# Patient Record
Sex: Male | Born: 1955 | Race: White | Hispanic: No | Marital: Single | Smoking: Current some day smoker
Health system: Southern US, Community
[De-identification: ages and names within clinical notes are randomized; demographics above are authoritative.]

## PROBLEM LIST (undated history)

## (undated) DIAGNOSIS — F319 Bipolar disorder, unspecified: Secondary | ICD-10-CM

## (undated) DIAGNOSIS — J189 Pneumonia, unspecified organism: Secondary | ICD-10-CM

## (undated) DIAGNOSIS — F329 Major depressive disorder, single episode, unspecified: Secondary | ICD-10-CM

## (undated) DIAGNOSIS — F1020 Alcohol dependence, uncomplicated: Secondary | ICD-10-CM

## (undated) DIAGNOSIS — F32A Depression, unspecified: Secondary | ICD-10-CM

## (undated) DIAGNOSIS — E7800 Pure hypercholesterolemia, unspecified: Secondary | ICD-10-CM

## (undated) DIAGNOSIS — F410 Panic disorder [episodic paroxysmal anxiety] without agoraphobia: Secondary | ICD-10-CM

## (undated) DIAGNOSIS — C3490 Malignant neoplasm of unspecified part of unspecified bronchus or lung: Secondary | ICD-10-CM

## (undated) DIAGNOSIS — J449 Chronic obstructive pulmonary disease, unspecified: Secondary | ICD-10-CM

## (undated) DIAGNOSIS — F419 Anxiety disorder, unspecified: Secondary | ICD-10-CM

## (undated) DIAGNOSIS — F4310 Post-traumatic stress disorder, unspecified: Secondary | ICD-10-CM

## (undated) HISTORY — PX: TONSILLECTOMY: SUR1361

---

## 2007-04-17 ENCOUNTER — Emergency Department (HOSPITAL_COMMUNITY): Admission: EM | Admit: 2007-04-17 | Discharge: 2007-04-17 | Payer: Self-pay | Admitting: Emergency Medicine

## 2010-09-19 ENCOUNTER — Encounter: Payer: Self-pay | Admitting: Emergency Medicine

## 2010-09-19 ENCOUNTER — Emergency Department (HOSPITAL_COMMUNITY)
Admission: EM | Admit: 2010-09-19 | Discharge: 2010-09-19 | Disposition: A | Discharge status: Home / Self Care | Payer: Medicare Other | Attending: Emergency Medicine | Admitting: Emergency Medicine

## 2010-09-19 ENCOUNTER — Emergency Department (HOSPITAL_COMMUNITY): Payer: Medicare Other

## 2010-09-19 DIAGNOSIS — J4489 Other specified chronic obstructive pulmonary disease: Secondary | ICD-10-CM | POA: Insufficient documentation

## 2010-09-19 DIAGNOSIS — J449 Chronic obstructive pulmonary disease, unspecified: Secondary | ICD-10-CM

## 2010-09-19 DIAGNOSIS — F17200 Nicotine dependence, unspecified, uncomplicated: Secondary | ICD-10-CM | POA: Insufficient documentation

## 2010-09-19 HISTORY — DX: Chronic obstructive pulmonary disease, unspecified: J44.9

## 2010-09-19 HISTORY — DX: Pure hypercholesterolemia, unspecified: E78.00

## 2010-09-19 MED ORDER — PREDNISONE 50 MG PO TABS: 50.0000 mg | ORAL_TABLET | Freq: Every day | ORAL | Status: AC

## 2010-09-19 MED ORDER — IPRATROPIUM BROMIDE 0.02 % IN SOLN
0.5000 mg | Freq: Once | RESPIRATORY_TRACT | Status: AC
  Administered 2010-09-19: 0.5 mg via RESPIRATORY_TRACT
  Filled 2010-09-19: qty 2.5

## 2010-09-19 MED ORDER — ALBUTEROL SULFATE (5 MG/ML) 0.5% IN NEBU
5.0000 mg | INHALATION_SOLUTION | Freq: Once | RESPIRATORY_TRACT | Status: AC
  Administered 2010-09-19: 5 mg via RESPIRATORY_TRACT
  Filled 2010-09-19: qty 1

## 2010-09-19 MED ORDER — PREDNISONE 20 MG PO TABS
60.0000 mg | ORAL_TABLET | Freq: Once | ORAL | Status: AC
  Administered 2010-09-19: 60 mg via ORAL
  Filled 2010-09-19: qty 3

## 2010-09-19 NOTE — ED Notes (Signed)
Sob x 1 week-worse with exertion.

## 2010-09-19 NOTE — ED Provider Notes (Addendum)
History     CSN: [id number] Arrival date & time: 09/19/2010  8:01 PM  Chief Complaint  Patient presents with  . Shortness of Breath   HPI patient has had a diagnosis of COPD for several years. He continues to smoke. Increasing shortness of breath over the past few months, getting worse. No chest pain, fever, chills, weight loss, wheezing. breathing worsened today trying to lift heavy object.  Past Medical History  Diagnosis Date  . COPD (chronic obstructive pulmonary disease)   . High cholesterol     Past Surgical History  Procedure Date  . Tonsillectomy     Family History  Problem Relation Age of Onset  . Coronary artery disease Mother   . Coronary artery disease Father     History  Substance Use Topics  . Smoking status: Current Everyday Smoker -- 1.0 packs/day  . Smokeless tobacco: Not on file  . Alcohol Use: No      Review of Systems  All other systems reviewed and are negative.    Physical Exam  BP 153/95  Pulse 82  Temp 97.9 F (36.6 C)  Resp 20  Ht 5\' 8"  (1.727 m)  Wt 175 lb (79.379 kg)  BMI 26.61 kg/m2  SpO2 95%  Physical Exam  Nursing note and vitals reviewed. Constitutional: He is oriented to person, place, and time. He appears well-developed and well-nourished.  HENT:  Head: Normocephalic and atraumatic.  Eyes: Conjunctivae and EOM are normal. Pupils are equal, round, and reactive to light.  Neck: Normal range of motion. Neck supple.  Cardiovascular: Normal rate and regular rhythm.   Pulmonary/Chest: Effort normal and breath sounds normal.  Abdominal: Soft. Bowel sounds are normal.  Musculoskeletal: Normal range of motion.  Neurological: He is alert and oriented to person, place, and time.  Skin: Skin is warm and dry.  Psychiatric: He has a normal mood and affect.    ED Course  Procedures  MDM patient has chronic COPD. He is a smoker. Chest x-ray was normal. Will give albuterol Atrovent breathing treatment and start prednisone.  Referral to the [facility] pulmonology   Recheck at 2120 good color. No respiratory distress. Patient understands need for pulmonary followup   Donnetta Hutching, MD 09/19/10 2115  Donnetta Hutching, MD 09/19/10 2122

## 2011-02-17 ENCOUNTER — Emergency Department (HOSPITAL_COMMUNITY)
Admission: EM | Admit: 2011-02-17 | Discharge: 2011-02-17 | Disposition: A | Discharge status: Home / Self Care | Payer: Medicare Other | Attending: Emergency Medicine | Admitting: Emergency Medicine

## 2011-02-17 ENCOUNTER — Encounter (HOSPITAL_COMMUNITY): Payer: Self-pay

## 2011-02-17 DIAGNOSIS — F411 Generalized anxiety disorder: Secondary | ICD-10-CM | POA: Insufficient documentation

## 2011-02-17 DIAGNOSIS — F319 Bipolar disorder, unspecified: Secondary | ICD-10-CM | POA: Insufficient documentation

## 2011-02-17 DIAGNOSIS — F329 Major depressive disorder, single episode, unspecified: Secondary | ICD-10-CM

## 2011-02-17 DIAGNOSIS — J449 Chronic obstructive pulmonary disease, unspecified: Secondary | ICD-10-CM | POA: Insufficient documentation

## 2011-02-17 DIAGNOSIS — F17200 Nicotine dependence, unspecified, uncomplicated: Secondary | ICD-10-CM | POA: Insufficient documentation

## 2011-02-17 DIAGNOSIS — F3130 Bipolar disorder, current episode depressed, mild or moderate severity, unspecified: Secondary | ICD-10-CM | POA: Insufficient documentation

## 2011-02-17 DIAGNOSIS — J4489 Other specified chronic obstructive pulmonary disease: Secondary | ICD-10-CM | POA: Insufficient documentation

## 2011-02-17 DIAGNOSIS — E789 Disorder of lipoprotein metabolism, unspecified: Secondary | ICD-10-CM | POA: Insufficient documentation

## 2011-02-17 HISTORY — DX: Depression, unspecified: F32.A

## 2011-02-17 HISTORY — DX: Anxiety disorder, unspecified: F41.9

## 2011-02-17 HISTORY — DX: Bipolar disorder, unspecified: F31.9

## 2011-02-17 HISTORY — DX: Post-traumatic stress disorder, unspecified: F43.10

## 2011-02-17 HISTORY — DX: Major depressive disorder, single episode, unspecified: F32.9

## 2011-02-17 HISTORY — DX: Panic disorder (episodic paroxysmal anxiety): F41.0

## 2011-02-17 HISTORY — DX: Alcohol dependence, uncomplicated: F10.20

## 2011-02-17 LAB — COMPREHENSIVE METABOLIC PANEL
ALT: 13 U/L (ref 0–53)
Alkaline Phosphatase: 64 U/L (ref 39–117)
BUN: 11 mg/dL (ref 6–23)
CO2: 29 mEq/L (ref 19–32)
Calcium: 10.1 mg/dL (ref 8.4–10.5)
GFR calc Af Amer: >90 mL/min (ref >90)
GFR calc non Af Amer: >90 mL/min (ref >90)
Glucose, Bld: 100 mg/dL — ABNORMAL HIGH (ref 70–99)
Sodium: 138 mEq/L (ref 135–145)
Total Protein: 7.5 g/dL (ref 6.0–8.3)

## 2011-02-17 LAB — CBC
Hemoglobin: 14.8 g/dL (ref 13.0–17.0)
MCHC: 33.6 g/dL (ref 30.0–36.0)
RDW: 12.7 % (ref 11.5–15.5)
WBC: 8.8 10*3/uL (ref 4.0–10.5)

## 2011-02-17 LAB — RAPID URINE DRUG SCREEN, HOSP PERFORMED
Benzodiazepines: NOT DETECTED
Cocaine: NOT DETECTED
Opiates: NOT DETECTED

## 2011-02-17 MED ORDER — PAROXETINE HCL 20 MG PO TABS: 40.0000 mg | ORAL_TABLET | Freq: Every day | ORAL | Status: DC

## 2011-02-17 MED ORDER — LORAZEPAM 1 MG PO TABS
1.0000 mg | ORAL_TABLET | Freq: Once | ORAL | Status: AC
  Administered 2011-02-17: 1 mg via ORAL
  Filled 2011-02-17: qty 1

## 2011-02-17 MED ORDER — LORAZEPAM 1 MG PO TABS: 0.5000 mg | ORAL_TABLET | Freq: Three times a day (TID) | ORAL | Status: AC | PRN

## 2011-02-17 MED ORDER — HALOPERIDOL 2 MG PO TABS: 2.0000 mg | ORAL_TABLET | Freq: Every evening | ORAL | Status: DC | PRN

## 2011-02-17 NOTE — ED Notes (Signed)
Rx given x3 D/c instructions reviewed w/ pt and family - pt and family deny any further questions or concerns at present.  

## 2011-02-17 NOTE — ED Notes (Signed)
Out of my mental health meds. For 3 days.  Does not have a doctor Out of ativan for 6 days, all over meds for 3 days.   Wife concerned that he may get sicker, pt denies any si/hi ideations, "im just sick of being sick and tired". Pt stated he is alcoholic and started ativan to keep him from drinking., denies any street drug use.

## 2011-02-17 NOTE — ED Provider Notes (Signed)
History     CSN: [id number]  Arrival date & time 02/17/11  1704   First MD Initiated Contact with Patient 02/17/11 1721      Chief Complaint  Patient presents with  . Medical Clearance    (Consider location/radiation/quality/duration/timing/severity/associated sxs/prior treatment) The history is provided by the patient.  pt states hx depression, hx etoh abuse, states for past several days out of ativan. States has taken on prn basis for past year. Prior that that states problems w etoh abuse, no etoh x 1 yr. States is on celexa but feels no benefit from taking.  States he feels the ativan does a better job at controlling his symptoms.  States still has on and off feelings of depression which he has had for past several years.  Denies having any suicidal thoughts or plan.  Indicates in past few weeks spending more time in bed, sleeping/resting, but that at night not getting consistent/good sleep.  Normal appetite. No wt change.  Denies tremors or hx seizures. No nvd. States otherwise health at baseline.     Past Medical History  Diagnosis Date  . COPD (chronic obstructive pulmonary disease)   . High cholesterol   . Alcohol addiction   . Depression   . Bipolar 1 disorder   . PTSD (post-traumatic stress disorder)   . Anxiety   . Panic     Past Surgical History  Procedure Date  . Tonsillectomy     Family History  Problem Relation Age of Onset  . Coronary artery disease Mother   . Coronary artery disease Father     History  Substance Use Topics  . Smoking status: Current Everyday Smoker -- 1.0 packs/day    Types: Cigarettes  . Smokeless tobacco: Not on file  . Alcohol Use: No     stopped 1 year ago      Review of Systems  Constitutional: Negative for fever.  HENT: Negative for neck pain.   Eyes: Negative for redness.  Respiratory: Negative for shortness of breath.   Cardiovascular: Negative for chest pain.  Gastrointestinal: Negative for vomiting, abdominal pain  and diarrhea.  Genitourinary: Negative for flank pain.  Musculoskeletal: Negative for back pain.  Skin: Negative for rash.  Neurological: Negative for headaches.  Hematological: Does not bruise/bleed easily.  Psychiatric/Behavioral: Negative for agitation.    Allergies  Review of patient's allergies indicates no known allergies.  Home Medications  No current outpatient prescriptions on file.  BP 154/92  Pulse 93  Temp(Src) 97.8 F (36.6 C) (Oral)  Resp 20  Ht 5\' 8"  (1.727 m)  Wt 185 lb (83.915 kg)  BMI 28.13 kg/m2  SpO2 97%  Physical Exam  Nursing note and vitals reviewed. Constitutional: He is oriented to person, place, and time. He appears well-developed and well-nourished. No distress.  HENT:  Head: Atraumatic.  Eyes: Pupils are equal, round, and reactive to light.  Neck: Neck supple. No tracheal deviation present.  Cardiovascular: Normal rate, regular rhythm, normal heart sounds and intact distal pulses.   Pulmonary/Chest: Effort normal and breath sounds normal. No accessory muscle usage. No respiratory distress.  Abdominal: Soft. He exhibits no distension. There is no tenderness.  Musculoskeletal: Normal range of motion. He exhibits no edema.  Neurological: He is alert and oriented to person, place, and time.       Motor intact bil. Steady gait.   Skin: Skin is warm and dry.  Psychiatric: He has a normal mood and affect.       Denies  SI.     ED Course  Procedures (including critical care time)  Labs Reviewed  COMPREHENSIVE METABOLIC PANEL - Abnormal; Notable for the following:    Glucose, Bld 100 (*)    All other components within normal limits  CBC  URINE RAPID DRUG SCREEN (HOSP PERFORMED)  ETHANOL   Results for orders placed during the hospital encounter of 02/17/11  COMPREHENSIVE METABOLIC PANEL      Component Value Range   Sodium 138  135 - 145 (mEq/L)   Potassium 3.7  3.5 - 5.1 (mEq/L)   Chloride 100  96 - 112 (mEq/L)   CO2 29  19 - 32 (mEq/L)    Glucose, Bld 100 (*) 70 - 99 (mg/dL)   BUN 11  6 - 23 (mg/dL)   Creatinine, Ser 2.95  0.50 - 1.35 (mg/dL)   Calcium 28.4  8.4 - 10.5 (mg/dL)   Total Protein 7.5  6.0 - 8.3 (g/dL)   Albumin 4.0  3.5 - 5.2 (g/dL)   AST 15  0 - 37 (U/L)   ALT 13  0 - 53 (U/L)   Alkaline Phosphatase 64  39 - 117 (U/L)   Total Bilirubin 0.5  0.3 - 1.2 (mg/dL)   GFR calc non Af Amer >90  >90 (mL/min)   GFR calc Af Amer >90  >90 (mL/min)  CBC      Component Value Range   WBC 8.8  4.0 - 10.5 (K/uL)   RBC 4.81  4.22 - 5.81 (MIL/uL)   Hemoglobin 14.8  13.0 - 17.0 (g/dL)   HCT 13.2  44.0 - 10.2 (%)   MCV 91.5  78.0 - 100.0 (fL)   MCH 30.8  26.0 - 34.0 (pg)   MCHC 33.6  30.0 - 36.0 (g/dL)   RDW 72.5  36.6 - 44.0 (%)   Platelets 307  150 - 400 (K/uL)  URINE RAPID DRUG SCREEN (HOSP PERFORMED)      Component Value Range   Opiates NONE DETECTED  NONE DETECTED    Cocaine NONE DETECTED  NONE DETECTED    Benzodiazepines NONE DETECTED  NONE DETECTED    Amphetamines NONE DETECTED  NONE DETECTED    Tetrahydrocannabinol NONE DETECTED  NONE DETECTED    Barbiturates NONE DETECTED  NONE DETECTED   ETHANOL      Component Value Range   Alcohol, Ethyl (B) <11  0 - 11 (mg/dL)        MDM  Labs sent. Ativan 1 mg po. Telepsych consult.   Recheck no tremor or shakes, watching tv intently. Have spoken to telepsych md who is about to evaluate patient, updated pt on labs, and that telepsych md should be with him shortly.    telepsych consult/recommendations pending. Signed out to DR DELO to f/u with telepsych recommendations and dispo patient appropriately (if telepsych rec inpt then will need act consult for placement, if rec outpt follow up then will also need resource guide and pcp follow up/referral).      Suzi Roots, MD 02/17/11 2141

## 2011-02-17 NOTE — ED Notes (Signed)
Pt completed telepsych assessment.

## 2011-04-06 ENCOUNTER — Encounter (HOSPITAL_COMMUNITY): Payer: Self-pay | Admitting: *Deleted

## 2011-04-06 ENCOUNTER — Emergency Department (HOSPITAL_COMMUNITY)
Admission: EM | Admit: 2011-04-06 | Discharge: 2011-04-06 | Disposition: A | Discharge status: Home / Self Care | Payer: Medicare Other | Attending: Emergency Medicine | Admitting: Emergency Medicine

## 2011-04-06 DIAGNOSIS — F17200 Nicotine dependence, unspecified, uncomplicated: Secondary | ICD-10-CM | POA: Insufficient documentation

## 2011-04-06 DIAGNOSIS — Z760 Encounter for issue of repeat prescription: Secondary | ICD-10-CM | POA: Insufficient documentation

## 2011-04-06 DIAGNOSIS — J449 Chronic obstructive pulmonary disease, unspecified: Secondary | ICD-10-CM | POA: Insufficient documentation

## 2011-04-06 DIAGNOSIS — F4310 Post-traumatic stress disorder, unspecified: Secondary | ICD-10-CM | POA: Insufficient documentation

## 2011-04-06 DIAGNOSIS — E7800 Pure hypercholesterolemia, unspecified: Secondary | ICD-10-CM | POA: Insufficient documentation

## 2011-04-06 DIAGNOSIS — J4489 Other specified chronic obstructive pulmonary disease: Secondary | ICD-10-CM | POA: Insufficient documentation

## 2011-04-06 DIAGNOSIS — F319 Bipolar disorder, unspecified: Secondary | ICD-10-CM | POA: Insufficient documentation

## 2011-04-06 MED ORDER — HALOPERIDOL 2 MG PO TABS: 2.0000 mg | ORAL_TABLET | Freq: Every day | ORAL | Status: DC

## 2011-04-06 MED ORDER — FLUTICASONE-SALMETEROL 250-50 MCG/DOSE IN AEPB: 1.0000 | INHALATION_SPRAY | Freq: Two times a day (BID) | RESPIRATORY_TRACT | Status: DC

## 2011-04-06 MED ORDER — PAROXETINE HCL 20 MG PO TABS: 40.0000 mg | ORAL_TABLET | Freq: Every day | ORAL | Status: DC

## 2011-04-06 MED ORDER — TIOTROPIUM BROMIDE MONOHYDRATE 18 MCG IN CAPS: 18.0000 ug | ORAL_CAPSULE | Freq: Every day | RESPIRATORY_TRACT | Status: DC

## 2011-04-06 MED ORDER — LORAZEPAM 0.5 MG PO TABS: 1.0000 mg | ORAL_TABLET | Freq: Three times a day (TID) | ORAL | Status: AC

## 2011-04-06 NOTE — ED Notes (Signed)
Alert, NAD, pt says he will run out of his meds over holiday weekend.  MD office closed today.  Wants refills

## 2011-04-06 NOTE — Discharge Instructions (Signed)
Medication Refill, Emergency Department  We have refilled your medication today as a courtesy to you. It is best for your medical care, however, to take care of getting refills done through your primary caregiver's office. They have your records and can do a better job of follow-up than we can in the emergency department.  On maintenance medications, we often only prescribe enough medications to get you by until you are able to see your regular caregiver. This is a more expensive way to refill medications.  In the future, please plan for refills so that you will not have to use the emergency department for this.  Thank you for your help. Your help allows us to better take care of the daily emergencies that enter our department.  Document Released: 04/13/2003 Document Revised: 12/14/2010 Document Reviewed: 12/25/2004  [employer] Patient Information 2012 [employer].

## 2011-04-06 NOTE — ED Notes (Signed)
Pt unable to see PMD today due to holiday. States he needs refill for albuterol inhaler, advair, spiriva. NAD. Also out of Haloperidol, lorazepam, and Paroxetine since March 15th. States he can't get in with this doctor until M

## 2011-04-08 NOTE — ED Provider Notes (Signed)
History     CSN: [id number]  Arrival date & time 04/06/11  1033   First MD Initiated Contact with Patient 04/06/11 1109      Chief Complaint  Patient presents with  . Medication Refill    (Consider location/radiation/quality/duration/timing/severity/associated sxs/prior treatment) HPI Comments: Patient presents for assistance with medication refill.  He is on multiple medications for history of COPD and posttraumatic stress disorder with anxiety and panic.  He has recently moved back to this area from [place] and [facility], unfortunately his first appointment with a psychiatrist is not until the first week of May.  He has run out of a number of his medications, specifically his Advair his Haldol, Ativan, Paxil and Spiriva.  He has no respiratory complaints today,  but is having increased anxiety and difficulty sleeping.  He tried to be a walk-in patient this morning at [facility] where he plans to resume his primary medical care, but was surprised to find that they were closed today due to the holiday weekend.  He denies any homicidal or suicidal ideation.  The history is provided by the patient.    Past Medical History  Diagnosis Date  . COPD (chronic obstructive pulmonary disease)   . High cholesterol   . Alcohol addiction   . Depression   . Bipolar 1 disorder   . PTSD (post-traumatic stress disorder)   . Anxiety   . Panic     Past Surgical History  Procedure Date  . Tonsillectomy     Family History  Problem Relation Age of Onset  . Coronary artery disease Mother   . Coronary artery disease Father     History  Substance Use Topics  . Smoking status: Current Everyday Smoker -- 1.0 packs/day    Types: Cigarettes  . Smokeless tobacco: Not on file  . Alcohol Use: No     stopped 1 year ago      Review of Systems  Constitutional: Negative for fever.  HENT: Negative for congestion, sore throat  and neck pain.   Eyes: Negative.   Respiratory: Negative for chest tightness and shortness of breath.   Cardiovascular: Negative for chest pain.  Gastrointestinal: Negative for nausea and abdominal pain.  Genitourinary: Negative.   Musculoskeletal: Negative for joint swelling and arthralgias.  Skin: Negative.  Negative for rash and wound.  Neurological: Negative for dizziness, weakness, light-headedness, numbness and headaches.  Hematological: Negative.   Psychiatric/Behavioral: Positive for sleep disturbance. Negative for suicidal ideas, hallucinations, confusion, self-injury and agitation. The patient is nervous/anxious.     Allergies  Review of patient's allergies indicates no known allergies.  Home Medications   Current Outpatient Rx  Name Route Sig Dispense Refill  . DIPHENHYDRAMINE HCL 25 MG PO CAPS Oral Take 50 mg by mouth at bedtime.    Marland Kitchen FLUTICASONE-SALMETEROL 250-50 MCG/DOSE IN AEPB Inhalation Inhale 1 puff into the lungs every 12 (twelve) hours.    Marland Kitchen HALOPERIDOL 2 MG PO TABS Oral Take 2 mg by mouth at bedtime. **May repeat one dose if needed**    . LORAZEPAM 0.5 MG PO TABS Oral Take 0.5 mg by mouth 3 (three) times daily as needed. For anxiety    . PAROXETINE HCL 20 MG PO TABS Oral Take 40 mg by mouth at bedtime.    Marland Kitchen TIOTROPIUM BROMIDE MONOHYDRATE 18 MCG IN CAPS Inhalation Place 18 mcg into inhaler and inhale daily.    Marland Kitchen FLUTICASONE-SALMETEROL 250-50 MCG/DOSE IN AEPB  Inhalation Inhale 1 puff into the lungs 2 (two) times daily. 60 each 0  . HALOPERIDOL 2 MG PO TABS Oral Take 1 tablet (2 mg total) by mouth at bedtime and may repeat dose one time if needed. 30 tablet 0  . HALOPERIDOL 2 MG PO TABS Oral Take 1 tablet (2 mg total) by mouth at bedtime. 30 tablet 0  . LORAZEPAM 0.5 MG PO TABS Oral Take 2 tablets (1 mg total) by mouth every 8 (eight) hours. 30 tablet 0  . PAROXETINE HCL 20 MG PO TABS Oral Take 2 tablets (40 mg total) by mouth at bedtime. 30 tablet 0  . TIOTROPIUM  BROMIDE MONOHYDRATE 18 MCG IN CAPS Inhalation Place 1 capsule (18 mcg total) into inhaler and inhale daily. 30 capsule 0    BP 146/80  Pulse 110  Temp(Src) 97.7 F (36.5 C) (Oral)  Resp 20  Ht 5\' 8"  (1.727 m)  Wt 185 lb (83.915 kg)  BMI 28.13 kg/m2  SpO2 97%  Physical Exam  Nursing note and vitals reviewed. Constitutional: He is oriented to person, place, and time. He appears well-developed and well-nourished.  HENT:  Head: Normocephalic and atraumatic.  Eyes: Conjunctivae are normal.  Neck: Normal range of motion.  Cardiovascular: Normal rate, regular rhythm, normal heart sounds and intact distal pulses.   Pulmonary/Chest: Effort normal and breath sounds normal. No respiratory distress. He has no wheezes.  Musculoskeletal: Normal range of motion.  Neurological: He is alert and oriented to person, place, and time.  Skin: Skin is warm and dry.  Psychiatric: His speech is normal and behavior is normal. Judgment and thought content normal. His mood appears anxious. His affect is not inappropriate. Cognition and memory are normal. He expresses no suicidal plans and no homicidal plans.    ED Course  Procedures (including critical care time)  Labs Reviewed - No data to display No results found.   1. Medication refill       MDM  Discussed patient with Dr. Adriana Simas prior to discharge home.  He was given 30 day refill prescription for his Advair and Spiriva.  Also given refills of his Haldol, Ativan and Paxil.  Encouraged followup with his new family clinic in [place] if another refill is needed prior to his establishing visit with [facility].        Candis Musa, PA 04/08/11 [phone]

## 2011-04-10 NOTE — ED Provider Notes (Signed)
Medical screening examination/treatment/procedure(s) were performed by non-physician practitioner and as supervising physician I was immediately available for consultation/collaboration.  Asser Lucena, MD 04/10/11 0124 

## 2011-07-08 ENCOUNTER — Emergency Department (HOSPITAL_COMMUNITY)
Admission: EM | Admit: 2011-07-08 | Discharge: 2011-07-08 | Disposition: A | Discharge status: Home / Self Care | Payer: Medicare Other | Attending: Emergency Medicine | Admitting: Emergency Medicine

## 2011-07-08 ENCOUNTER — Encounter (HOSPITAL_COMMUNITY): Payer: Self-pay | Admitting: Emergency Medicine

## 2011-07-08 DIAGNOSIS — J449 Chronic obstructive pulmonary disease, unspecified: Secondary | ICD-10-CM | POA: Insufficient documentation

## 2011-07-08 DIAGNOSIS — F17200 Nicotine dependence, unspecified, uncomplicated: Secondary | ICD-10-CM | POA: Insufficient documentation

## 2011-07-08 DIAGNOSIS — F411 Generalized anxiety disorder: Secondary | ICD-10-CM | POA: Insufficient documentation

## 2011-07-08 DIAGNOSIS — E7800 Pure hypercholesterolemia, unspecified: Secondary | ICD-10-CM | POA: Insufficient documentation

## 2011-07-08 DIAGNOSIS — Z760 Encounter for issue of repeat prescription: Secondary | ICD-10-CM | POA: Insufficient documentation

## 2011-07-08 DIAGNOSIS — F1010 Alcohol abuse, uncomplicated: Secondary | ICD-10-CM | POA: Insufficient documentation

## 2011-07-08 DIAGNOSIS — F319 Bipolar disorder, unspecified: Secondary | ICD-10-CM | POA: Insufficient documentation

## 2011-07-08 DIAGNOSIS — F4310 Post-traumatic stress disorder, unspecified: Secondary | ICD-10-CM | POA: Insufficient documentation

## 2011-07-08 DIAGNOSIS — J4489 Other specified chronic obstructive pulmonary disease: Secondary | ICD-10-CM | POA: Insufficient documentation

## 2011-07-08 MED ORDER — ALBUTEROL SULFATE (2.5 MG/3ML) 0.083% IN NEBU: 2.5000 mg | INHALATION_SOLUTION | Freq: Four times a day (QID) | RESPIRATORY_TRACT | Status: DC | PRN

## 2011-07-08 NOTE — ED Notes (Signed)
Patient with no complaints at this time. Respirations even and unlabored. Skin warm/dry. Discharge instructions reviewed with patient at this time. Patient given opportunity to voice concerns/ask questions. Patient discharged at this time and left Emergency Department with steady gait.   

## 2011-07-08 NOTE — Discharge Instructions (Signed)
Medication Refill, Emergency Department  We have refilled your medication today as a courtesy to you. It is best for your medical care, however, to take care of getting refills done through your primary caregiver's office. They have your records and can do a better job of follow-up than we can in the emergency department.  On maintenance medications, we often only prescribe enough medications to get you by until you are able to see your regular caregiver. This is a more expensive way to refill medications.  In the future, please plan for refills so that you will not have to use the emergency department for this.  Thank you for your help. Your help allows us to better take care of the daily emergencies that enter our department.  Document Released: 04/13/2003 Document Revised: 12/14/2010 Document Reviewed: 12/25/2004  [employer] Patient Information 2012 [employer].

## 2011-07-08 NOTE — ED Notes (Signed)
Patient requesting refill on nebulizer solution. Patient reports the pharmacy has been unable to reach patient's PMD office for "diagnosis code". Patient reports shortness of breath, but states it is at his normal baseline with COPD.

## 2011-07-08 NOTE — ED Provider Notes (Signed)
Medical screening examination/treatment/procedure(s) were performed by non-physician practitioner and as supervising physician I was immediately available for consultation/collaboration.  Devoria Albe, MD, Armando Gang   Ward Givens, MD 07/08/11 [phone]

## 2011-07-08 NOTE — ED Provider Notes (Signed)
History     CSN: [id number]  Arrival date & time 07/08/11  0903   First MD Initiated Contact with Patient 07/08/11 0913      Chief Complaint  Patient presents with  . Medication Refill    (Consider location/radiation/quality/duration/timing/severity/associated sxs/prior treatment) HPI Comments: Patient states he has a history of chronic obstructive pulmonary disease. He is on several inhalers and nebulizer treatments. The patient states that most recently he had submitted a request for the pharmacy to notify his primary care physician to refill his albuterol nebulizer solution. The pharmacy reports that they need more information from the doctor's office that they have not received for her insurance. The patient is now out of his albuterol, and states that he" has to have it" to breathe in this weather.  The history is provided by the patient.    Past Medical History  Diagnosis Date  . COPD (chronic obstructive pulmonary disease)   . High cholesterol   . Alcohol addiction   . Depression   . Bipolar 1 disorder   . PTSD (post-traumatic stress disorder)   . Anxiety   . Panic     Past Surgical History  Procedure Date  . Tonsillectomy     Family History  Problem Relation Age of Onset  . Coronary artery disease Mother   . Coronary artery disease Father     History  Substance Use Topics  . Smoking status: Current Everyday Smoker -- 1.0 packs/day    Types: Cigarettes  . Smokeless tobacco: Not on file  . Alcohol Use: No     stopped 1 year ago      Review of Systems  Constitutional: Negative for activity change.       All ROS Neg except as noted in HPI  HENT: Negative for nosebleeds and neck pain.   Eyes: Negative for photophobia and discharge.  Respiratory: Positive for shortness of breath and wheezing. Negative for cough.   Cardiovascular: Negative for chest pain and palpitations.  Gastrointestinal: Negative for abdominal pain and blood in stool.  Genitourinary:  Negative for dysuria, frequency and hematuria.  Musculoskeletal: Negative for back pain and arthralgias.  Skin: Negative.   Neurological: Negative for dizziness, seizures and speech difficulty.  Psychiatric/Behavioral: Negative for hallucinations and confusion.       Depression    Allergies  Review of patient's allergies indicates no known allergies.  Home Medications   Current Outpatient Rx  Name Route Sig Dispense Refill  . ALBUTEROL SULFATE (2.5 MG/3ML) 0.083% IN NEBU Nebulization Take 3 mLs (2.5 mg total) by nebulization every 6 (six) hours as needed for wheezing. 75 mL 12  . DIPHENHYDRAMINE HCL 25 MG PO CAPS Oral Take 50 mg by mouth at bedtime.    Marland Kitchen FLUTICASONE-SALMETEROL 250-50 MCG/DOSE IN AEPB Inhalation Inhale 1 puff into the lungs 2 (two) times daily. 60 each 0  . FLUTICASONE-SALMETEROL 250-50 MCG/DOSE IN AEPB Inhalation Inhale 1 puff into the lungs every 12 (twelve) hours.    Marland Kitchen HALOPERIDOL 2 MG PO TABS Oral Take 2 mg by mouth at bedtime. **May repeat one dose if needed**    . HALOPERIDOL 2 MG PO TABS Oral Take 1 tablet (2 mg total) by mouth at bedtime and may repeat dose one time if needed. 30 tablet 0  . HALOPERIDOL 2 MG PO TABS Oral Take 1 tablet (2 mg total) by mouth at bedtime. 30 tablet 0  . LORAZEPAM 0.5 MG PO TABS Oral Take 0.5 mg by mouth 3 (three) times daily  as needed. For anxiety    . PAROXETINE HCL 20 MG PO TABS Oral Take 40 mg by mouth at bedtime.    Marland Kitchen PAROXETINE HCL 20 MG PO TABS Oral Take 2 tablets (40 mg total) by mouth at bedtime. 30 tablet 0  . TIOTROPIUM BROMIDE MONOHYDRATE 18 MCG IN CAPS Inhalation Place 1 capsule (18 mcg total) into inhaler and inhale daily. 30 capsule 0  . TIOTROPIUM BROMIDE MONOHYDRATE 18 MCG IN CAPS Inhalation Place 18 mcg into inhaler and inhale daily.      BP 144/124  Pulse 86  Temp 98.3 F (36.8 C) (Oral)  Resp 17  Ht 5\' 8"  (1.727 m)  Wt 195 lb (88.451 kg)  BMI 29.65 kg/m2  SpO2 95%  Physical Exam  Nursing note and vitals  reviewed. Constitutional: He is oriented to person, place, and time. He appears well-developed and well-nourished.  Non-toxic appearance.  HENT:  Head: Normocephalic.  Right Ear: Tympanic membrane and external ear normal.  Left Ear: Tympanic membrane and external ear normal.  Eyes: EOM and lids are normal. Pupils are equal, round, and reactive to light.  Neck: Normal range of motion. Neck supple. Carotid bruit is not present.  Cardiovascular: Normal rate, regular rhythm, normal heart sounds, intact distal pulses and normal pulses.   Pulmonary/Chest: Breath sounds normal. No respiratory distress.       Few rhonchi and soft wheezes present bilaterally. Patient is not using the sensory muscles to breathe. He is speaking in complete sentences. He is in no distress.  Abdominal: Soft. Bowel sounds are normal. There is no tenderness. There is no guarding.  Musculoskeletal: Normal range of motion.  Lymphadenopathy:       Head (right side): No submandibular adenopathy present.       Head (left side): No submandibular adenopathy present.    He has no cervical adenopathy.  Neurological: He is alert and oriented to person, place, and time. He has normal strength. No cranial nerve deficit or sensory deficit.  Skin: Skin is warm and dry.  Psychiatric: He has a normal mood and affect. His speech is normal.    ED Course  Procedures (including critical care time)  Labs Reviewed - No data to display No results found.   1. Medication refill       MDM  I have reviewed nursing notes, vital signs, and all appropriate lab and imaging results for this patient.  Patient has a history of chronic obstructive pulmonary disease. He is on several medications for his breathing. His pharmacy was unable to reach his physician for additional information for the insurance to pay for some of his medicines. He is now out of albuterol. He presents to the emergency department for assistance with getting this  prescription. Prescription for albuterol 2.5 mg per 3 mL on given to the patient. Patient is to see his primary physician to have this situation worked out.      Kathie Dike, [place] 07/08/11 0930

## 2011-09-16 ENCOUNTER — Encounter (HOSPITAL_COMMUNITY): Payer: Self-pay | Admitting: *Deleted

## 2011-09-16 ENCOUNTER — Emergency Department (HOSPITAL_COMMUNITY): Payer: Medicare Other

## 2011-09-16 ENCOUNTER — Emergency Department (HOSPITAL_COMMUNITY)
Admission: EM | Admit: 2011-09-16 | Discharge: 2011-09-16 | Disposition: A | Discharge status: Home / Self Care | Payer: Medicare Other | Attending: Emergency Medicine | Admitting: Emergency Medicine

## 2011-09-16 DIAGNOSIS — F17200 Nicotine dependence, unspecified, uncomplicated: Secondary | ICD-10-CM | POA: Insufficient documentation

## 2011-09-16 DIAGNOSIS — J441 Chronic obstructive pulmonary disease with (acute) exacerbation: Secondary | ICD-10-CM

## 2011-09-16 DIAGNOSIS — R0602 Shortness of breath: Secondary | ICD-10-CM | POA: Insufficient documentation

## 2011-09-16 LAB — BASIC METABOLIC PANEL
BUN: 11 mg/dL (ref 6–23)
CO2: 24 mEq/L (ref 19–32)
Calcium: 9.9 mg/dL (ref 8.4–10.5)
Creatinine, Ser: 0.76 mg/dL (ref 0.50–1.35)
Glucose, Bld: 102 mg/dL — ABNORMAL HIGH (ref 70–99)

## 2011-09-16 LAB — TROPONIN I: Troponin I: <0.3 ng/mL (ref <0.30)

## 2011-09-16 LAB — CBC
Hemoglobin: 14.5 g/dL (ref 13.0–17.0)
MCH: 31.1 pg (ref 26.0–34.0)
MCHC: 34.4 g/dL (ref 30.0–36.0)
MCV: 90.3 fL (ref 78.0–100.0)
RBC: 4.66 MIL/uL (ref 4.22–5.81)

## 2011-09-16 MED ORDER — PREDNISONE 20 MG PO TABS: 60.0000 mg | ORAL_TABLET | Freq: Once | ORAL | Status: AC

## 2011-09-16 MED ORDER — ALBUTEROL SULFATE (5 MG/ML) 0.5% IN NEBU
5.0000 mg | INHALATION_SOLUTION | Freq: Once | RESPIRATORY_TRACT | Status: AC
  Administered 2011-09-16: 5 mg via RESPIRATORY_TRACT
  Filled 2011-09-16: qty 1

## 2011-09-16 MED ORDER — FLUTICASONE-SALMETEROL 250-50 MCG/DOSE IN AEPB: 1.0000 | INHALATION_SPRAY | Freq: Two times a day (BID) | RESPIRATORY_TRACT | Status: DC

## 2011-09-16 MED ORDER — PREDNISONE 20 MG PO TABS
60.0000 mg | ORAL_TABLET | Freq: Once | ORAL | Status: AC
  Administered 2011-09-16: 60 mg via ORAL
  Filled 2011-09-16: qty 3

## 2011-09-16 MED ORDER — TIOTROPIUM BROMIDE MONOHYDRATE 18 MCG IN CAPS: 18.0000 ug | ORAL_CAPSULE | Freq: Every day | RESPIRATORY_TRACT | Status: DC

## 2011-09-16 MED ORDER — IPRATROPIUM BROMIDE 0.02 % IN SOLN
0.5000 mg | Freq: Once | RESPIRATORY_TRACT | Status: AC
  Administered 2011-09-16: 0.5 mg via RESPIRATORY_TRACT
  Filled 2011-09-16: qty 2.5

## 2011-09-16 MED ORDER — ALBUTEROL SULFATE HFA 108 (90 BASE) MCG/ACT IN AERS: 2.0000 | INHALATION_SPRAY | RESPIRATORY_TRACT | Status: DC | PRN

## 2011-09-16 NOTE — ED Provider Notes (Signed)
History   This chart was scribed for Lyanne Co, MD by Albertha Ghee Rifaie. This patient was seen in room APA10/APA10 and the patient's care was started at 10:16 AM.   CSN: [id number]  Arrival date & time 09/16/11  1004   First MD Initiated Contact with Patient 09/16/11 1016      Chief Complaint  Patient presents with  . Shortness of Breath   HPI  The history is provided by the patient. No language interpreter was used.   Robert [name] is a 56 y.o. male who presents to the Emergency Department complaining of two days of gradual worsening, constant SOB associated with cough productive of yellow-brown sputum. The symptoms are worse at night and with dyspnea. Symptoms improve with lying on his side. Pt has a history of COBD and reports chronic SOB but states that today his symptoms are worse. He reports using his inhaler at home with no obvious improvement. Pt also reports increase SOB with minimal exertion and reports that this is chronic as well. He states that whenever he coughs due to his SOB he feels anxious which he recognizes can make the symptoms worse. Pt also complains of losing ROM in his bilateral extremities. He denies having any symptoms of  fevers, chills, or leg swelling. Pt smokes once a day but plans to quit. He didn't report any alcohol use.      Past Medical History  Diagnosis Date  . COPD (chronic obstructive pulmonary disease)   . High cholesterol   . Alcohol addiction   . Depression   . Bipolar 1 disorder   . PTSD (post-traumatic stress disorder)   . Anxiety   . Panic     Past Surgical History  Procedure Date  . Tonsillectomy     Family History  Problem Relation Age of Onset  . Coronary artery disease Mother   . Coronary artery disease Father     History  Substance Use Topics  . Smoking status: Current Everyday Smoker -- 1.0 packs/day    Types: Cigarettes  . Smokeless tobacco: Not on file  . Alcohol Use: No     stopped 1 year ago       Review of Systems  A complete 10 system review of systems was obtained and all systems are negative except as noted in the HPI and PMH.    Allergies  Review of patient's allergies indicates no known allergies.  Home Medications   Current Outpatient Rx  Name Route Sig Dispense Refill  . ALBUTEROL SULFATE (2.5 MG/3ML) 0.083% IN NEBU Nebulization Take 3 mLs (2.5 mg total) by nebulization every 6 (six) hours as needed for wheezing. 75 mL 12  . DIPHENHYDRAMINE HCL 25 MG PO CAPS Oral Take 50 mg by mouth at bedtime.    Marland Kitchen FLUTICASONE-SALMETEROL 250-50 MCG/DOSE IN AEPB Inhalation Inhale 1 puff into the lungs 2 (two) times daily. 60 each 0  . PAROXETINE HCL 20 MG PO TABS Oral Take 40 mg by mouth at bedtime.    Marland Kitchen TIOTROPIUM BROMIDE MONOHYDRATE 18 MCG IN CAPS Inhalation Place 1 capsule (18 mcg total) into inhaler and inhale daily. 30 capsule 0    Triage Vitals: BP 153/81  Pulse 93  Temp 98.1 F (36.7 C)  Resp 24  Ht 5\' 8"  (1.727 m)  Wt 200 lb (90.719 kg)  BMI 30.41 kg/m2  SpO2 96%  Physical Exam  Nursing note and vitals reviewed. Constitutional: He is oriented to person, place, and time. He appears well-developed  and well-nourished.  HENT:  Head: Normocephalic and atraumatic.  Eyes: EOM are normal.  Neck: Normal range of motion.  Cardiovascular: Normal rate, regular rhythm, normal heart sounds and intact distal pulses.   Pulmonary/Chest: Effort normal. No respiratory distress. He has wheezes (expiratory wheezing bilateral). He has no rales. He exhibits no tenderness.  Abdominal: Soft. He exhibits no distension. There is no tenderness.  Musculoskeletal: Normal range of motion.  Neurological: He is alert and oriented to person, place, and time.  Skin: Skin is warm and dry.  Psychiatric: He has a normal mood and affect. Judgment normal.    ED Course  Procedures (including critical care time)   Date: 09/16/2011  Rate: 84  Rhythm: normal sinus rhythm  QRS Axis: normal   Intervals: normal  ST/T Wave abnormalities: normal  Conduction Disutrbances: none  Narrative Interpretation:   Old EKG Reviewed: No significant changes noted     DIAGNOSTIC STUDIES: Oxygen Saturation is 96% on room air, adequate by my interpretation.    COORDINATION OF CARE: 10:50 AM Discussed treatment plan with pt at bedside and pt agreed to plan. 10:55 AM discussed smoking Cessation and advised pt to come with a plan  Pt agreed to formulating a plan and try to stop.    Labs Reviewed  BASIC METABOLIC PANEL - Abnormal; Notable for the following:    Sodium 133 (*)     Glucose, Bld 102 (*)     All other components within normal limits  CBC  TROPONIN I   Dg Chest 2 View  09/16/2011  *RADIOLOGY REPORT*  Clinical Data: Shortness of breath.  CHEST - 2 VIEW  Comparison: Chest x-ray 09/19/2010.  Findings: Lungs appear hyperexpanded with flattening of the hemidiaphragms, increased retrosternal air space and pruning of the pulmonary vasculature in the periphery, suggestive of underlying COPD.  No focal consolidative airspace disease.  No pleural effusions.  No definite suspicious appearing pulmonary nodules or masses are identified.  No evidence of edema.  Heart size is normal.  Mediastinal contours are unremarkable.  Atherosclerosis in the thoracic aorta.  IMPRESSION: 1.  Changes of COPD redemonstrated, without radiographic evidence of acute cardiopulmonary disease. 2.  Atherosclerosis.   Original Report Authenticated By: Florencia Reasons, M.D.      I personally reviewed the imaging tests through PACS system  I reviewed available ER/hospitalization records thought the EMR   1. COPD exacerbation       MDM  This appears to be consistent with a COPD exacerbation.  The patient feels much better after breathing treatments.  His labs and chest x-ray without significant abnormality.  Have instructed the patient to stop smoking cigarettes.  He understands the importance of close PCP  followup.  Refills of his medications were given.  The patient be discharged home with schedule albuterol a 5 day course of prednisone.  He understands to return to the emergency department for new or worsening symptoms      I personally performed the services described in this documentation, which was scribed in my presence. The recorded information has been reviewed and considered.  '  Lyanne Co, MD 09/16/11 1152

## 2011-09-16 NOTE — ED Notes (Signed)
C/o increasing sob, cough, worse during the night, cough is productive with yellow brown sputum production, hx of COPD, pt states that he has been using his inhaler at home without any improvement. Denies any pain.

## 2013-05-10 ENCOUNTER — Emergency Department (HOSPITAL_COMMUNITY): Payer: Medicare Other

## 2013-05-10 ENCOUNTER — Encounter (HOSPITAL_COMMUNITY): Payer: Self-pay | Admitting: Emergency Medicine

## 2013-05-10 ENCOUNTER — Emergency Department (HOSPITAL_COMMUNITY)
Admission: EM | Admit: 2013-05-10 | Discharge: 2013-05-11 | Disposition: A | Discharge status: Home / Self Care | Payer: Medicare Other | Attending: Emergency Medicine | Admitting: Emergency Medicine

## 2013-05-10 DIAGNOSIS — R000 Tachycardia, unspecified: Secondary | ICD-10-CM | POA: Insufficient documentation

## 2013-05-10 DIAGNOSIS — F17200 Nicotine dependence, unspecified, uncomplicated: Secondary | ICD-10-CM | POA: Insufficient documentation

## 2013-05-10 DIAGNOSIS — Z79899 Other long term (current) drug therapy: Secondary | ICD-10-CM | POA: Insufficient documentation

## 2013-05-10 DIAGNOSIS — E7800 Pure hypercholesterolemia, unspecified: Secondary | ICD-10-CM | POA: Insufficient documentation

## 2013-05-10 DIAGNOSIS — F4310 Post-traumatic stress disorder, unspecified: Secondary | ICD-10-CM | POA: Insufficient documentation

## 2013-05-10 DIAGNOSIS — IMO0002 Reserved for concepts with insufficient information to code with codable children: Secondary | ICD-10-CM | POA: Insufficient documentation

## 2013-05-10 DIAGNOSIS — F319 Bipolar disorder, unspecified: Secondary | ICD-10-CM | POA: Insufficient documentation

## 2013-05-10 DIAGNOSIS — J441 Chronic obstructive pulmonary disease with (acute) exacerbation: Secondary | ICD-10-CM | POA: Insufficient documentation

## 2013-05-10 DIAGNOSIS — Z9981 Dependence on supplemental oxygen: Secondary | ICD-10-CM | POA: Insufficient documentation

## 2013-05-10 DIAGNOSIS — F410 Panic disorder [episodic paroxysmal anxiety] without agoraphobia: Secondary | ICD-10-CM | POA: Insufficient documentation

## 2013-05-10 LAB — I-STAT TROPONIN, ED: TROPONIN I, POC: 0 ng/mL (ref 0.00–0.08)

## 2013-05-10 LAB — BASIC METABOLIC PANEL
BUN: 8 mg/dL (ref 6–23)
CALCIUM: 9.2 mg/dL (ref 8.4–10.5)
CO2: 24 mEq/L (ref 19–32)
Chloride: 96 mEq/L (ref 96–112)
Creatinine, Ser: 0.71 mg/dL (ref 0.50–1.35)
Glucose, Bld: 129 mg/dL — ABNORMAL HIGH (ref 70–99)
POTASSIUM: 3.7 meq/L (ref 3.7–5.3)
SODIUM: 136 meq/L — AB (ref 137–147)

## 2013-05-10 LAB — CBC WITH DIFFERENTIAL/PLATELET
BASOS PCT: 0 % (ref 0–1)
Basophils Absolute: 0 10*3/uL (ref 0.0–0.1)
EOS ABS: 0.1 10*3/uL (ref 0.0–0.7)
EOS PCT: 2 % (ref 0–5)
HCT: 42.4 % (ref 39.0–52.0)
Hemoglobin: 14.6 g/dL (ref 13.0–17.0)
Lymphocytes Relative: 39 % (ref 12–46)
Lymphs Abs: 2.6 10*3/uL (ref 0.7–4.0)
MCH: 31.9 pg (ref 26.0–34.0)
MCHC: 34.4 g/dL (ref 30.0–36.0)
MCV: 92.8 fL (ref 78.0–100.0)
Monocytes Absolute: 0.6 10*3/uL (ref 0.1–1.0)
Monocytes Relative: 9 % (ref 3–12)
NEUTROS PCT: 50 % (ref 43–77)
Neutro Abs: 3.3 10*3/uL (ref 1.7–7.7)
PLATELETS: 266 10*3/uL (ref 150–400)
RBC: 4.57 MIL/uL (ref 4.22–5.81)
RDW: 13.9 % (ref 11.5–15.5)
WBC: 6.6 10*3/uL (ref 4.0–10.5)

## 2013-05-10 MED ORDER — IPRATROPIUM BROMIDE 0.02 % IN SOLN
RESPIRATORY_TRACT | Status: AC
  Administered 2013-05-10: 1 mg
  Filled 2013-05-10: qty 5

## 2013-05-10 MED ORDER — ALBUTEROL (5 MG/ML) CONTINUOUS INHALATION SOLN
INHALATION_SOLUTION | RESPIRATORY_TRACT | Status: AC
  Administered 2013-05-10: 22:00:00
  Filled 2013-05-10: qty 20

## 2013-05-10 MED ORDER — ALBUTEROL (5 MG/ML) CONTINUOUS INHALATION SOLN: 10.0000 mg/h | INHALATION_SOLUTION | RESPIRATORY_TRACT | Status: DC

## 2013-05-10 MED ORDER — PREDNISONE 10 MG PO TABS
60.0000 mg | ORAL_TABLET | Freq: Once | ORAL | Status: AC
  Administered 2013-05-11: 60 mg via ORAL
  Filled 2013-05-10 (×2): qty 1

## 2013-05-10 NOTE — ED Provider Notes (Addendum)
CSN: [id number]     Arrival date & time 05/10/13  2208 History   This chart was scribed for Babette Relic, MD by Jenne Campus, ED Scribe. This patient was seen in room APA02/APA02 and the patient's care was started at 10:17 PM.     Chief Complaint  Patient presents with  . Shortness of Breath    The history is provided by the patient. No language interpreter was used.    HPI Comments: Robert [name] is a 59 y.o. male with a h/o COPD who is on 2L [place] at night presents to the Emergency Department complaining of worsening than chronic baseline SOB that started few hours ago. He states that the symptoms are similar to prior COPD exacerbations. EMS gave 125 mg Solumedrol, one albuterol and one douneb en route with mild improvement. Pt denies any antibiotic or steroid use in the past month. He reports he is a current everyday smoker but quit tioday. Patient has a chronic cough and chronic shortness of breath for months at baseline but for the last few days he has had some slight increase in cough and slight increase in shortness of breath for last few days and thinks he needs some steroids again because they usually make him feel better when he feels this way then tonight for a few hours prior to arrival his shortness breath got gradually worse. He denies any fever, confusion, syncope, diarrhea or emesis as associated symptoms.    Past Medical History  Diagnosis Date  . COPD (chronic obstructive pulmonary disease)   . High cholesterol   . Alcohol addiction   . Depression   . Bipolar 1 disorder   . PTSD (post-traumatic stress disorder)   . Anxiety   . Panic    Past Surgical History  Procedure Laterality Date  . Tonsillectomy     Family History  Problem Relation Age of Onset  . Coronary artery disease Mother   . Coronary artery disease Father    History  Substance Use Topics  . Smoking status: Current Every Day Smoker -- 1.00 packs/day    Types: Cigarettes  . Smokeless tobacco: Not  on file  . Alcohol Use: Yes    Review of Systems  A complete 10 system review of systems was obtained and all systems are negative except as noted in the HPI and PMH.    Allergies  Review of patient's allergies indicates no known allergies.  Home Medications   Prior to Admission medications   Medication Sig Start Date End Date Taking? Authorizing Provider  diphenhydrAMINE (BENADRYL) 25 mg capsule Take 50 mg by mouth at bedtime.    Historical Provider, MD  Fluticasone-Salmeterol (ADVAIR DISKUS) 250-50 MCG/DOSE AEPB Inhale 1 puff into the lungs 2 (two) times daily. 09/16/11   Hoy Morn, MD  PARoxetine (PAXIL) 20 MG tablet Take 20 mg by mouth daily.  02/17/11 02/17/12  Veryl Speak, MD  tiotropium (SPIRIVA HANDIHALER) 18 MCG inhalation capsule Place 1 capsule (18 mcg total) into inhaler and inhale daily. 09/16/11 09/15/12  Hoy Morn, MD   Triage vitals: BP 134/76  Pulse 102  Temp(Src) 97.4 F (36.3 C) (Oral)  Resp 28  Ht 5\' 8"  (1.727 m)  Wt 200 lb (90.719 kg)  BMI 30.42 kg/m2  SpO2 100%  Physical Exam  Nursing note and vitals reviewed. Constitutional:  Awake, alert, nontoxic appearance.  HENT:  Head: Atraumatic.  Eyes: Right eye exhibits no discharge. Left eye exhibits no discharge.  Neck: Neck supple.  Cardiovascular: Regular rhythm.  Tachycardia present.   No murmur heard. HR 102  Pulmonary/Chest: Accessory muscle usage (mild) present. Tachypnea noted. He is in respiratory distress (moderate). He exhibits no tenderness.  Speaks in short phrases. Pulse ox normal 94% on RA PTA. No retractions. Diffuse wheezes. No crackles. Mild accessory muscle usage.  Abdominal: Soft. There is no tenderness. There is no rebound.  Musculoskeletal: He exhibits no edema and no tenderness.  Baseline ROM, no obvious new focal weakness.  Neurological:  Mental status and motor strength appears baseline for patient and situation.  Skin: No rash noted.  Psychiatric: He has a normal mood and  affect.    ED Course  Procedures (including critical care time)  DIAGNOSTIC STUDIES:    COORDINATION OF CARE: 10:22 PM-Patient understand and agrees with initial ED impression and plan with expectations set for ED visit.  2355- feels much better, speaks full sentences with mild distress, no accessory muscle usage, no retractions, but still has diffuse expiratory wheezes with pulse oximetry 92-93% on 2 L nasal cannula oxygen and the patient wears 2 L nasal cannula oxygen at nighttime at baseline; patient offered admission but prefers discharge after another continue nebulizer in the ED.  CRITICAL CARE Performed by: Babette Relic Total critical care time: 71min (Pt received continuous albuterol neb for an hour in the ED after multiple nebs PTA) Critical care time was exclusive of separately billable procedures and treating other patients. Critical care was necessary to treat or prevent imminent or life-threatening deterioration. Critical care was time spent personally by me on the following activities: development of treatment plan with patient and/or surrogate as well as nursing, discussions with consultants, evaluation of patient's response to treatment, examination of patient, obtaining history from patient or surrogate, ordering and performing treatments and interventions, ordering and review of laboratory studies, ordering and review of radiographic studies, pulse oximetry and re-evaluation of patient's condition.  Care handoff to Dr. Roxanne Mins. 0030  Labs Review Labs Reviewed  BASIC METABOLIC PANEL - Abnormal; Notable for the following:    Sodium 136 (*)    Glucose, Bld 129 (*)    All other components within normal limits  CBC WITH DIFFERENTIAL  I-STAT TROPOININ, ED    Imaging Review No results found.   EKG Interpretation   Date/Time:  Sunday May 10 2013 22:17:09 EDT Ventricular Rate:  97 PR Interval:  171 QRS Duration: 93 QT Interval:  357 QTC Calculation: 453 R Axis:    91 Text Interpretation:  Sinus rhythm Borderline right axis deviation No  significant change since last tracing Confirmed by [facility]  MD, Jenny Reichmann  [phone]) on 05/10/2013 10:35:24 PM     ECG Muse interface not working: Sinus rhythm, ventricular rate 97, borderline right axis deviation, no acute ischemic changes noted, no significant change noted compared with prior ECG MDM   Final diagnoses:  COPD with acute exacerbation    I doubt any other EMC precluding discharge at this time including, but not necessarily limited to the following:ACS, CHF, sepsis.  I personally performed the services described in this documentation, which was scribed in my presence. The recorded information has been reviewed and is accurate.       Babette Relic, MD 05/13/13 2224  Babette Relic, MD 05/13/13 2226

## 2013-05-10 NOTE — ED Notes (Signed)
Pt states his breathing is better now. Pt respiratory rate has slowed & pt is not as anxious.

## 2013-05-10 NOTE — ED Notes (Signed)
Pt states started having SOB that started this evening. Pt had breathing tx at home & douneb, albuterol & 125 soulmedrol  By EMS.

## 2013-05-11 MED ORDER — DOXYCYCLINE HYCLATE 100 MG PO CAPS: 100.0000 mg | ORAL_CAPSULE | Freq: Two times a day (BID) | ORAL | Status: DC

## 2013-05-11 MED ORDER — PREDNISONE 20 MG PO TABS: ORAL_TABLET | ORAL | Status: DC

## 2013-05-11 NOTE — Discharge Instructions (Signed)
Chronic Obstructive Pulmonary Disease Exacerbation  Chronic obstructive pulmonary disease (COPD) is a common lung problem. In COPD, the flow of air from the lungs is limited. COPD exacerbations are times that breathing gets worse and you need extra treatment. Without treatment they can be life threatening. If they happen often, your lungs can become more damaged. HOME CARE  Do not smoke.  Avoid tobacco smoke and other things that bother your lungs.  If given, take your antibiotic medicine as told. Finish the medicine even if you start to feel better.  Only take medicines as told by your doctor.  Drink enough fluids to keep your pee (urine) clear or pale yellow (unless your doctor has told you not to).  Use a cool mist machine (vaporizer).  If you use oxygen or a machine that turns liquid medicine into a mist (nebulizer), continue to use them as told.  Keep up with shots (vaccinations) as told by your doctor.  Exercise regularly.  Eat healthy foods.  Keep all doctor visits as told. GET HELP RIGHT AWAY IF:  You are very short of breath and it gets worse.  You have trouble talking.  You have bad chest pain.  You have blood in your spit (sputum).  You have a fever.  You keep throwing up (vomiting).  You feel weak, or you pass out (faint).  You feel confused.  You keep getting worse. MAKE SURE YOU:   Understand these instructions.  Will watch your condition.  Will get help right away if you are not doing well or get worse. Document Released: 12/14/2010 Document Revised: 10/15/2012 Document Reviewed: 08/29/2012 [employer] Patient Information 2014 [employer].  

## 2013-05-11 NOTE — ED Notes (Signed)
Pt alert & oriented x4, stable gait. Patient given discharge instructions, paperwork & prescription(s). Patient  instructed to stop at the registration desk to finish any additional paperwork. Patient verbalized understanding. Pt left department w/ no further questions. 

## 2013-06-29 ENCOUNTER — Encounter (HOSPITAL_COMMUNITY): Payer: Self-pay | Admitting: Emergency Medicine

## 2013-06-29 ENCOUNTER — Emergency Department (HOSPITAL_COMMUNITY): Payer: Medicare Other

## 2013-06-29 ENCOUNTER — Emergency Department (HOSPITAL_COMMUNITY)
Admission: EM | Admit: 2013-06-29 | Discharge: 2013-06-29 | Disposition: A | Discharge status: Home / Self Care | Payer: Medicare Other | Attending: Emergency Medicine | Admitting: Emergency Medicine

## 2013-06-29 DIAGNOSIS — Z862 Personal history of diseases of the blood and blood-forming organs and certain disorders involving the immune mechanism: Secondary | ICD-10-CM | POA: Insufficient documentation

## 2013-06-29 DIAGNOSIS — Z79899 Other long term (current) drug therapy: Secondary | ICD-10-CM | POA: Insufficient documentation

## 2013-06-29 DIAGNOSIS — J441 Chronic obstructive pulmonary disease with (acute) exacerbation: Secondary | ICD-10-CM

## 2013-06-29 DIAGNOSIS — Z8639 Personal history of other endocrine, nutritional and metabolic disease: Secondary | ICD-10-CM | POA: Insufficient documentation

## 2013-06-29 DIAGNOSIS — F17200 Nicotine dependence, unspecified, uncomplicated: Secondary | ICD-10-CM | POA: Insufficient documentation

## 2013-06-29 DIAGNOSIS — Z8659 Personal history of other mental and behavioral disorders: Secondary | ICD-10-CM | POA: Insufficient documentation

## 2013-06-29 DIAGNOSIS — F3289 Other specified depressive episodes: Secondary | ICD-10-CM | POA: Insufficient documentation

## 2013-06-29 DIAGNOSIS — F329 Major depressive disorder, single episode, unspecified: Secondary | ICD-10-CM | POA: Insufficient documentation

## 2013-06-29 LAB — CBC WITH DIFFERENTIAL/PLATELET
Basophils Absolute: 0 10*3/uL (ref 0.0–0.1)
Basophils Relative: 0 % (ref 0–1)
Eosinophils Absolute: 0.1 10*3/uL (ref 0.0–0.7)
Eosinophils Relative: 1 % (ref 0–5)
HCT: 40.5 % (ref 39.0–52.0)
Hemoglobin: 13.6 g/dL (ref 13.0–17.0)
Lymphocytes Relative: 22 % (ref 12–46)
Lymphs Abs: 1.6 10*3/uL (ref 0.7–4.0)
MCH: 31.5 pg (ref 26.0–34.0)
MCHC: 33.6 g/dL (ref 30.0–36.0)
MCV: 93.8 fL (ref 78.0–100.0)
Monocytes Absolute: 1.1 10*3/uL — ABNORMAL HIGH (ref 0.1–1.0)
Monocytes Relative: 14 % — ABNORMAL HIGH (ref 3–12)
Neutro Abs: 4.6 10*3/uL (ref 1.7–7.7)
Neutrophils Relative %: 63 % (ref 43–77)
Platelets: 271 10*3/uL (ref 150–400)
RBC: 4.32 MIL/uL (ref 4.22–5.81)
RDW: 13 % (ref 11.5–15.5)
WBC: 7.3 10*3/uL (ref 4.0–10.5)

## 2013-06-29 LAB — BASIC METABOLIC PANEL
BUN: 12 mg/dL (ref 6–23)
CO2: 28 mEq/L (ref 19–32)
Calcium: 9.2 mg/dL (ref 8.4–10.5)
Chloride: 99 mEq/L (ref 96–112)
Creatinine, Ser: 0.85 mg/dL (ref 0.50–1.35)
GFR calc Af Amer: >90 mL/min (ref >90)
GFR calc non Af Amer: >90 mL/min (ref >90)
Glucose, Bld: 103 mg/dL — ABNORMAL HIGH (ref 70–99)
Potassium: 4 mEq/L (ref 3.7–5.3)
Sodium: 138 mEq/L (ref 137–147)

## 2013-06-29 MED ORDER — PREDNISONE 20 MG PO TABS: 40.0000 mg | ORAL_TABLET | Freq: Every day | ORAL | Status: DC

## 2013-06-29 MED ORDER — IPRATROPIUM-ALBUTEROL 0.5-2.5 (3) MG/3ML IN SOLN
3.0000 mL | Freq: Once | RESPIRATORY_TRACT | Status: AC
  Administered 2013-06-29: 3 mL via RESPIRATORY_TRACT
  Filled 2013-06-29: qty 3

## 2013-06-29 MED ORDER — ALBUTEROL SULFATE (2.5 MG/3ML) 0.083% IN NEBU
5.0000 mg | INHALATION_SOLUTION | Freq: Once | RESPIRATORY_TRACT | Status: AC
  Administered 2013-06-29: 5 mg via RESPIRATORY_TRACT
  Filled 2013-06-29: qty 6

## 2013-06-29 MED ORDER — IPRATROPIUM BROMIDE 0.02 % IN SOLN
0.5000 mg | Freq: Once | RESPIRATORY_TRACT | Status: AC
  Administered 2013-06-29: 0.5 mg via RESPIRATORY_TRACT
  Filled 2013-06-29: qty 2.5

## 2013-06-29 MED ORDER — METHYLPREDNISOLONE SODIUM SUCC 125 MG IJ SOLR
125.0000 mg | Freq: Once | INTRAMUSCULAR | Status: AC
  Administered 2013-06-29: 125 mg via INTRAVENOUS
  Filled 2013-06-29: qty 2

## 2013-06-29 MED ORDER — LORAZEPAM 2 MG/ML IJ SOLN
1.0000 mg | Freq: Once | INTRAMUSCULAR | Status: AC
  Administered 2013-06-29: 1 mg via INTRAVENOUS
  Filled 2013-06-29: qty 1

## 2013-06-29 MED ORDER — LORAZEPAM 1 MG PO TABS: 0.5000 mg | ORAL_TABLET | Freq: Three times a day (TID) | ORAL | Status: DC | PRN

## 2013-06-29 MED ORDER — GUAIFENESIN-CODEINE 100-10 MG/5ML PO SOLN
10.0000 mL | Freq: Once | ORAL | Status: AC
  Administered 2013-06-29: 10 mL via ORAL
  Filled 2013-06-29: qty 10

## 2013-06-29 NOTE — Discharge Instructions (Signed)
Chronic Obstructive Pulmonary Disease Chronic obstructive pulmonary disease (COPD) is a common lung condition in which airflow from the lungs is limited. COPD is a general term that can be used to describe many different lung problems that limit airflow, including both chronic bronchitis and emphysema. If you have COPD, your lung function will probably never return to normal, but there are measures you can take to improve lung function and make yourself feel better.  CAUSES   Smoking (common).   Exposure to secondhand smoke.   Genetic problems.  Chronic inflammatory lung diseases or recurrent infections. SYMPTOMS   Shortness of breath, especially with physical activity.   Deep, persistent (chronic) cough with a large amount of thick mucus.   Wheezing.   Rapid breaths (tachypnea).   Gray or bluish discoloration (cyanosis) of the skin, especially in fingers, toes, or lips.   Fatigue.   Weight loss.   Frequent infections or episodes when breathing symptoms become much worse (exacerbations).   Chest tightness. DIAGNOSIS  Your healthcare provider will take a medical history and perform a physical examination to make the initial diagnosis. Additional tests for COPD may include:   Lung (pulmonary) function tests.  Chest X-ray.  CT scan.  Blood tests. TREATMENT  Treatment available to help you feel better when you have COPD include:   Inhaler and nebulizer medicines. These help manage the symptoms of COPD and make your breathing more comfortable  Supplemental oxygen. Supplemental oxygen is only helpful if you have a low oxygen level in your blood.   Exercise and physical activity. These are beneficial for nearly all people with COPD. Some people may also benefit from a pulmonary rehabilitation program. HOME CARE INSTRUCTIONS   Take all medicines (inhaled or pills) as directed by your health care provider.  Only take over-the-counter or prescription medicines  for pain, fever, or discomfort as directed by your health care provider.   Avoid over-the-counter medicines or cough syrups that dry up your airway (such as antihistamines) and slow down the elimination of secretions unless instructed otherwise by your healthcare provider.   If you are a smoker, the most important thing that you can do is stop smoking. Continuing to smoke will cause further lung damage and breathing trouble. Ask your health care provider for help with quitting smoking. He or she can direct you to community resources or hospitals that provide support.  Avoid exposure to irritants such as smoke, chemicals, and fumes that aggravate your breathing.  Use oxygen therapy and pulmonary rehabilitation if directed by your health care provider. If you require home oxygen therapy, ask your healthcare provider whether you should purchase a pulse oximeter to measure your oxygen level at home.   Avoid contact with individuals who have a contagious illness.  Avoid extreme temperature and humidity changes.  Eat healthy foods. Eating smaller, more frequent meals and resting before meals may help you maintain your strength.  Stay active, but balance activity with periods of rest. Exercise and physical activity will help you maintain your ability to do things you want to do.  Preventing infection and hospitalization is very important when you have COPD. Make sure to receive all the vaccines your health care provider recommends, especially the pneumococcal and influenza vaccines. Ask your healthcare provider whether you need a pneumonia vaccine.  Learn and use relaxation techniques to manage stress.  Learn and use controlled breathing techniques as directed by your health care provider. Controlled breathing techniques include:   Pursed lip breathing. Start by breathing   in (inhaling) through your nose for 1 second. Then, purse your lips as if you were going to whistle and breathe out (exhale)  through the pursed lips for 2 seconds.   Diaphragmatic breathing. Start by putting one hand on your abdomen just above your waist. Inhale slowly through your nose. The hand on your abdomen should move out. Then purse your lips and exhale slowly. You should be able to feel the hand on your abdomen moving in as you exhale.   Learn and use controlled coughing to clear mucus from your lungs. Controlled coughing is a series of short, progressive coughs. The steps of controlled coughing are:  1. Lean your head slightly forward.  2. Breathe in deeply using diaphragmatic breathing.  3. Try to hold your breath for 3 seconds.  4. Keep your mouth slightly open while coughing twice.  5. Spit any mucus out into a tissue.  6. Rest and repeat the steps once or twice as needed. SEEK MEDICAL CARE IF:   You are coughing up more mucus than usual.   There is a change in the color or thickness of your mucus.   Your breathing is more labored than usual.   Your breathing is faster than usual.  SEEK IMMEDIATE MEDICAL CARE IF:   You have shortness of breath while you are resting.   You have shortness of breath that prevents you from:  Being able to talk.   Performing your usual physical activities.   You have chest pain lasting longer than 5 minutes.   Your skin color is more cyanotic than usual.  You measure low oxygen saturations for longer than 5 minutes with a pulse oximeter. MAKE SURE YOU:   Understand these instructions.  Will watch your condition.  Will get help right away if you are not doing well or get worse. Document Released: 10/04/2004 Document Revised: 10/15/2012 Document Reviewed: 08/21/2012 [employer] Patient Information 2015 [employer]. This information is not intended to replace advice given to you by your health care provider. Make sure you discuss any questions you have with your health care provider.  

## 2013-06-29 NOTE — ED Provider Notes (Signed)
CSN: [id number]     Arrival date & time 06/29/13  1855 History   First MD Initiated Contact with Patient 06/29/13 1919     Chief Complaint  Patient presents with  . Shortness of Breath     (Consider location/radiation/quality/duration/timing/severity/associated sxs/prior Treatment) HPI  58 year old male with coughing and shortness of breath. Past history of chronic obstructive pulmonary disease. Normally uses oxygen only at night. For the past 2-3 days cephalic is needed during the day as well. When he uses albuterol it feels like it's not doing anything. Coughing fits. He feels oral over. No fevers or chills. No unusual leg pain or swelling. Continues to smoke. Most recently on steroids the beginning of May for about a week. None since then. Her recent antibiotic usage. No history of DVT/pulmonary embolism. No recent surgery or prolonged immobilization.  Past Medical History  Diagnosis Date  . COPD (chronic obstructive pulmonary disease)   . High cholesterol   . Alcohol addiction   . Depression   . Bipolar 1 disorder   . PTSD (post-traumatic stress disorder)   . Anxiety   . Panic    Past Surgical History  Procedure Laterality Date  . Tonsillectomy     Family History  Problem Relation Age of Onset  . Coronary artery disease Mother   . Coronary artery disease Father    History  Substance Use Topics  . Smoking status: Current Every Day Smoker -- 1.00 packs/day    Types: Cigarettes  . Smokeless tobacco: Not on file  . Alcohol Use: Yes    Review of Systems  All systems reviewed and negative, other than as noted in HPI.   Allergies  Review of patient's allergies indicates no known allergies.  Home Medications   Prior to Admission medications   Medication Sig Start Date End Date Taking? Authorizing Elior Robinette  albuterol (PROAIR HFA) 108 (90 BASE) MCG/ACT inhaler Inhale 2 puffs into the lungs every 6 (six) hours as needed for wheezing or shortness of breath.   Yes  Historical Amor Packard, MD  ANORO ELLIPTA 62.5-25 MCG/INH AEPB Inhale 1 puff into the lungs every evening.  04/15/13  Yes Historical Jazion Atteberry, MD  diphenhydrAMINE (BENADRYL) 25 mg capsule Take 50 mg by mouth at bedtime.   Yes Historical Kalyiah Saintil, MD  ipratropium-albuterol (DUONEB) 0.5-2.5 (3) MG/3ML SOLN Take 3 mLs by nebulization every 6 (six) hours as needed.   Yes Historical Maryl Blalock, MD  theophylline (THEO-24) 300 MG 24 hr capsule Take 300 mg by mouth daily.   Yes Historical Zela Sobieski, MD   BP 147/71  Pulse 104  Temp(Src) 98.4 F (36.9 C) (Oral)  Resp 24  Ht 5\' 8"  (1.727 m)  Wt 200 lb (90.719 kg)  BMI 30.42 kg/m2  SpO2 99% Physical Exam  Nursing note and vitals reviewed. Constitutional: He appears well-developed and well-nourished. No distress.  HENT:  Head: Normocephalic and atraumatic.  Eyes: Conjunctivae are normal. Right eye exhibits no discharge. Left eye exhibits no discharge.  Neck: Neck supple.  Cardiovascular: Normal rate, regular rhythm and normal heart sounds.  Exam reveals no gallop and no friction rub.   No murmur heard. Pulmonary/Chest: He has wheezes.  Mild tachypnea. Prolonged expiratory phase. Wheezing bilaterally.  Abdominal: Soft. He exhibits no distension. There is no tenderness.  Musculoskeletal: He exhibits no edema and no tenderness.  Lower extremities symmetric as compared to each other. No calf tenderness. Negative Homan's. No palpable cords.   Neurological: He is alert.  Skin: Skin is warm and dry.  Psychiatric:  He has a normal mood and affect. His behavior is normal. Thought content normal.    ED Course  Procedures (including critical care time) Labs Review Labs Reviewed  CBC WITH DIFFERENTIAL - Abnormal; Notable for the following:    Monocytes Relative 14 (*)    Monocytes Absolute 1.1 (*)    All other components within normal limits  BASIC METABOLIC PANEL - Abnormal; Notable for the following:    Glucose, Bld 103 (*)    All other components  within normal limits    Imaging Review Dg Chest 2 View  06/29/2013   CLINICAL DATA:  Shortness of breath with cough and chest pain.  EXAM: CHEST  2 VIEW  COMPARISON:  05/10/2013  FINDINGS: The lungs are clear without focal infiltrate, edema, pneumothorax or pleural effusion. Hyper expansion is consistent with emphysema. Imaged bony structures of the thorax are intact.  IMPRESSION: Emphysema without acute cardiopulmonary findings.   Electronically Signed   By: Misty Stanley M.D.   On: 06/29/2013 20:33     EKG Interpretation None      MDM   Final diagnoses:  COPD with exacerbation    58 year old male with what I clinically suspect his exacerbation of his COPD. Afebrile. Will check a chest x-ray. Nebulized bronchodilators as well as steroids. Will place an IV and check some basic blood work. Doubt pulmonary embolism. Patient reports a history of anxiety which makes him feel more short of breath. Will be given a dose of Ativan. We'll continue to reassess. Disposition pending patient's response to treatment.   Feels much better after nebs and ativan. Requesting prescription for ativan. Given small amount. Discussed need to talk with single Nichollas Perusse about prescribing this in future. Steroids. Return precautions discussed.      [name], MD 06/30/13 [phone]

## 2013-06-29 NOTE — ED Notes (Signed)
Pt c/o sob x 2 days.

## 2013-08-03 ENCOUNTER — Emergency Department (HOSPITAL_COMMUNITY)
Admission: EM | Admit: 2013-08-03 | Discharge: 2013-08-03 | Disposition: A | Discharge status: Home / Self Care | Payer: Medicare Other | Attending: Emergency Medicine | Admitting: Emergency Medicine

## 2013-08-03 ENCOUNTER — Emergency Department (HOSPITAL_COMMUNITY): Payer: Medicare Other

## 2013-08-03 ENCOUNTER — Encounter (HOSPITAL_COMMUNITY): Payer: Self-pay | Admitting: Emergency Medicine

## 2013-08-03 DIAGNOSIS — F10239 Alcohol dependence with withdrawal, unspecified: Secondary | ICD-10-CM | POA: Insufficient documentation

## 2013-08-03 DIAGNOSIS — Z8659 Personal history of other mental and behavioral disorders: Secondary | ICD-10-CM | POA: Insufficient documentation

## 2013-08-03 DIAGNOSIS — Z862 Personal history of diseases of the blood and blood-forming organs and certain disorders involving the immune mechanism: Secondary | ICD-10-CM | POA: Diagnosis not present

## 2013-08-03 DIAGNOSIS — F1010 Alcohol abuse, uncomplicated: Secondary | ICD-10-CM | POA: Insufficient documentation

## 2013-08-03 DIAGNOSIS — F10930 Alcohol use, unspecified with withdrawal, uncomplicated: Secondary | ICD-10-CM

## 2013-08-03 DIAGNOSIS — J441 Chronic obstructive pulmonary disease with (acute) exacerbation: Secondary | ICD-10-CM | POA: Insufficient documentation

## 2013-08-03 DIAGNOSIS — Z79899 Other long term (current) drug therapy: Secondary | ICD-10-CM | POA: Insufficient documentation

## 2013-08-03 DIAGNOSIS — F10230 Alcohol dependence with withdrawal, uncomplicated: Secondary | ICD-10-CM

## 2013-08-03 DIAGNOSIS — F10939 Alcohol use, unspecified with withdrawal, unspecified: Secondary | ICD-10-CM | POA: Insufficient documentation

## 2013-08-03 DIAGNOSIS — F17200 Nicotine dependence, unspecified, uncomplicated: Secondary | ICD-10-CM | POA: Insufficient documentation

## 2013-08-03 DIAGNOSIS — R0602 Shortness of breath: Secondary | ICD-10-CM | POA: Diagnosis present

## 2013-08-03 DIAGNOSIS — Z8639 Personal history of other endocrine, nutritional and metabolic disease: Secondary | ICD-10-CM | POA: Insufficient documentation

## 2013-08-03 LAB — BASIC METABOLIC PANEL
Anion gap: 13 (ref 5–15)
BUN: 7 mg/dL (ref 6–23)
CO2: 28 mEq/L (ref 19–32)
Calcium: 9.4 mg/dL (ref 8.4–10.5)
Chloride: 96 mEq/L (ref 96–112)
Creatinine, Ser: 0.84 mg/dL (ref 0.50–1.35)
GFR calc Af Amer: >90 mL/min (ref >90)
GFR calc non Af Amer: >90 mL/min (ref >90)
Glucose, Bld: 130 mg/dL — ABNORMAL HIGH (ref 70–99)
Potassium: 4.6 mEq/L (ref 3.7–5.3)
Sodium: 137 mEq/L (ref 137–147)

## 2013-08-03 LAB — CBC WITH DIFFERENTIAL/PLATELET
Basophils Absolute: 0 10*3/uL (ref 0.0–0.1)
Basophils Relative: 1 % (ref 0–1)
Eosinophils Absolute: 0.1 10*3/uL (ref 0.0–0.7)
Eosinophils Relative: 2 % (ref 0–5)
HCT: 42.2 % (ref 39.0–52.0)
Hemoglobin: 14.8 g/dL (ref 13.0–17.0)
Lymphocytes Relative: 36 % (ref 12–46)
Lymphs Abs: 2.4 10*3/uL (ref 0.7–4.0)
MCH: 32.1 pg (ref 26.0–34.0)
MCHC: 35.1 g/dL (ref 30.0–36.0)
MCV: 91.5 fL (ref 78.0–100.0)
Monocytes Absolute: 0.6 10*3/uL (ref 0.1–1.0)
Monocytes Relative: 8 % (ref 3–12)
Neutro Abs: 3.5 10*3/uL (ref 1.7–7.7)
Neutrophils Relative %: 53 % (ref 43–77)
Platelets: 345 10*3/uL (ref 150–400)
RBC: 4.61 MIL/uL (ref 4.22–5.81)
RDW: 13.1 % (ref 11.5–15.5)
WBC: 6.7 10*3/uL (ref 4.0–10.5)

## 2013-08-03 LAB — PRO B NATRIURETIC PEPTIDE: Pro B Natriuretic peptide (BNP): 20.9 pg/mL (ref 0–125)

## 2013-08-03 LAB — ETHANOL: Alcohol, Ethyl (B): 239 mg/dL — ABNORMAL HIGH (ref 0–11)

## 2013-08-03 MED ORDER — LORAZEPAM 1 MG PO TABS: 1.0000 mg | ORAL_TABLET | Freq: Three times a day (TID) | ORAL | Status: DC | PRN

## 2013-08-03 MED ORDER — LORAZEPAM 1 MG PO TABS
1.0000 mg | ORAL_TABLET | Freq: Once | ORAL | Status: AC
  Administered 2013-08-03: 1 mg via ORAL
  Filled 2013-08-03: qty 1

## 2013-08-03 NOTE — ED Notes (Addendum)
Pt states SOB due to alcoholism. States he has been drinking 5 days around the clock. Last had a beer an hour ago. States he gets SOB when due to increased heart rate from drinking after drinking a few days. Asked pt if he wanted detox, pt stated he is unable to go to a nonmedical detox because he is afraid he will die, states he is here for help with SOB. Pt states, "right now my breathing is pretty normal but once the alcohol begins to wear off, it gets hard for me to even get around the house."

## 2013-08-03 NOTE — Discharge Instructions (Signed)
Alcohol Withdrawal Anytime drug use is interfering with normal living activities it has become abuse. This includes problems with family and friends. Psychological dependence has developed when your mind tells you that the drug is needed. This is usually followed by physical dependence when a continuing increase of drugs are required to get the same feeling or "high." This is known as addiction or chemical dependency. A person's risk is much higher if there is a history of chemical dependency in the family. Mild Withdrawal Following Stopping Alcohol, When Addiction or Chemical Dependency Has Developed When a person has developed tolerance to alcohol, any sudden stopping of alcohol can cause uncomfortable physical symptoms. Most of the time these are mild and consist of tremors in the hands and increases in heart rate, breathing, and temperature. Sometimes these symptoms are associated with anxiety, panic attacks, and bad dreams. There may also be stomach upset. Normal sleep patterns are often interrupted with periods of inability to sleep (insomnia). This may last for 6 months. Because of this discomfort, many people choose to continue drinking to get rid of this discomfort and to try to feel normal. Severe Withdrawal with Decreased or No Alcohol Intake, When Addiction or Chemical Dependency Has Developed About five percent of alcoholics will develop signs of severe withdrawal when they stop using alcohol. One sign of this is development of generalized seizures (convulsions). Other signs of this are severe agitation and confusion. This may be associated with believing in things which are not real or seeing things which are not really there (delusions and hallucinations). Vitamin deficiencies are usually present if alcohol intake has been long-term. Treatment for this most often requires hospitalization and close observation. Addiction can only be helped by stopping use of all chemicals. This is hard but may  save your life. With continual alcohol use, possible outcomes are usually loss of self respect and esteem, violence, and death. Addiction cannot be cured but it can be stopped. This often requires outside help and the care of professionals. Treatment centers are listed in the yellow pages under Cocaine, Narcotics, and Alcoholics Anonymous. Most hospitals and clinics can refer you to a specialized care center. It is not necessary for you to go through the uncomfortable symptoms of withdrawal. Your caregiver can provide you with medicines that will help you through this difficult period. Try to avoid situations, friends, or drugs that made it possible for you to keep using alcohol in the past. Learn how to say no. It takes a long period of time to overcome addictions to all drugs, including alcohol. There may be many times when you feel as though you want a drink. After getting rid of the physical addiction and withdrawal, you will have a lessening of the craving which tells you that you need alcohol to feel normal. Call your caregiver if more support is needed. Learn who to talk to in your family and among your friends so that during these periods you can receive outside help. Alcoholics Anonymous (AA) has helped many people over the years. To get further help, contact AA or call your caregiver, counselor, or clergyperson. Al-Anon and Alateen are support groups for friends and family members of an alcoholic. The people who love and care for an alcoholic often need help, too. For information about these organizations, check your phone directory or call a local alcoholism treatment center.  SEEK IMMEDIATE MEDICAL CARE IF:   You have a seizure.  You have a fever.  You experience uncontrolled vomiting or you  vomit up blood. This may be bright red or look like black coffee grounds.  You have blood in the stool. This may be bright red or appear as a black, tarry, bad-smelling stool.  You become lightheaded or  faint. Do not drive if you feel this way. Have someone else drive you or call 284 for help.  You become more agitated or confused.  You develop uncontrolled anxiety.  You begin to see things that are not really there (hallucinate). Your caregiver has determined that you completely understand your medical condition, and that your mental state is back to normal. You understand that you have been treated for alcohol withdrawal, have agreed not to drink any alcohol for a minimum of 1 day, will not operate a car or other machinery for 24 hours, and have had an opportunity to ask any questions about your condition. Document Released: 10/04/2004 Document Revised: 03/19/2011 Document Reviewed: 08/13/2007 [facility] Patient Information 2015 [place], [place]. This information is not intended to replace advice given to you by your health care provider. Make sure you discuss any questions you have with your health care provider.  Alcohol and Nutrition Nutrition serves two purposes. It provides energy. It also maintains body structure and function. Food supplies energy. It also provides the building blocks needed to replace worn or damaged cells. Alcoholics often eat poorly. This limits their supply of essential nutrients. This affects energy supply and structure maintenance. Alcohol also affects the body's nutrients in:  Digestion.  Storage.  Using and getting rid of waste products. IMPAIRMENT OF NUTRIENT DIGESTION AND UTILIZATION   Once ingested, food must be broken down into small components (digested). Then it is available for energy. It helps maintain body structure and function. Digestion begins in the mouth. It continues in the stomach and intestines, with help from the pancreas. The nutrients from digested food are absorbed from the intestines into the blood. Then they are carried to the liver. The liver prepares nutrients for:  Immediate use.  Storage and future use.  Alcohol inhibits the  breakdown of nutrients into usable molecules.  It decreases secretion of digestive enzymes from the pancreas.  Alcohol impairs nutrient absorption by damaging the cells lining the stomach and intestines.  It also interferes with moving some nutrients into the blood.  In addition, nutritional deficiencies themselves may lead to further absorption problems.  For example, folate deficiency changes the cells that line the small intestine. This impairs how water is absorbed. It also affects absorbed nutrients. These include glucose, sodium, and additional folate.  Even if nutrients are digested and absorbed, alcohol can prevent them from being fully used. It changes their transport, storage, and excretion. Impaired utilization of nutrients by alcoholics is indicated by:  Decreased liver stores of vitamins, such as vitamin A.  Increased excretion of nutrients such as fat. ALCOHOL AND ENERGY SUPPLY   Three basic nutritional components found in food are:  Carbohydrates.  Proteins.  Fats.  These are used as energy. Some alcoholics take in as much as 50% of their total daily calories from alcohol. They often neglect important foods.  Even when enough food is eaten, alcohol can impair the ways the body controls blood sugar (glucose) levels. It may either increase or decrease blood sugar.  In non-diabetic alcoholics, increased blood sugar (hyperglycemia) is caused by poor insulin secretion. It is usually temporary.  Decreased blood sugar (hypoglycemia) can cause serious injury even if this condition is short-lived. Low blood sugar can happen when a fasting or  malnourished person drinks alcohol. When there is no food to supply energy, stored sugar is used up. The products of alcohol inhibit forming glucose from other compounds such as amino acids. As a result, alcohol causes the brain and other body tissue to lack glucose. It is needed for energy and function.  Alcohol is an energy source. But  how the body processes and uses the energy from alcohol is complex. Also, when alcohol is substituted for carbohydrates, subjects tend to lose weight. This indicates that they get less energy from alcohol than from food. ALCOHOL - MAINTAINING CELL STRUCTURE AND FUNCTION  Structure Cells are made mostly of protein. So an adequate protein diet is important for maintaining cell structure. This is especially true if cells are being damaged. Research indicates that alcohol affects protein nutrition by causing impaired:  Digestion of proteins to amino acids.  Processing of amino acids by the small intestine and liver.  Synthesis of proteins from amino acids.  Protein secretion by the liver. Function Nutrients are essential for the body to function well. They provide the tools that the body needs to work well:   Proteins.  Vitamins.  Minerals. Alcohol can disrupt body function. It may cause nutrient deficiencies. And it may interfere with the way nutrients are processed. Vitamins  Vitamins are essential to maintain growth and normal metabolism. They regulate many of the body`s processes. Chronic heavy drinking causes deficiencies in many vitamins. This is caused by eating less. And, in some cases, vitamins may be poorly absorbed. For example, alcohol inhibits fat absorption. It impairs how the vitamins A, E, and D are normally absorbed along with dietary fats. Not enough vitamin A may cause night blindness. Not enough vitamin D may cause softening of the bones.  Some alcoholics lack vitamins A, C, D, E, K, and the B vitamins. These are all involved in wound healing and cell maintenance. In particular, because vitamin K is necessary for blood clotting, lacking that vitamin can cause delayed clotting. The result is excess bleeding. Lacking other vitamins involved in brain function may cause severe neurological damage. Minerals Deficiencies of minerals such as calcium, magnesium, iron, and zinc are  common in alcoholics. The alcohol itself does not seem to affect how these minerals are absorbed. Rather, they seem to occur secondary to other alcohol-related problems, such as:  Less calcium absorbed.  Not enough magnesium.  More urinary excretion.  Vomiting.  Diarrhea.  Not enough iron due to gastrointestinal bleeding.  Not enough zinc or losses related to other nutrient deficiencies.  Mineral deficiencies can cause a variety of medical consequences. These range from calcium-related bone disease to zinc-related night blindness and skin lesions. ALCOHOL, MALNUTRITION, AND MEDICAL COMPLICATIONS  Liver Disease   Alcoholic liver damage is caused primarily by alcohol itself. But poor nutrition may increase the risk of alcohol-related liver damage. For example, nutrients normally found in the liver are known to be affected by drinking alcohol. These include carotenoids, which are the major sources of vitamin A, and vitamin E compounds. Decreases in such nutrients may play some role in alcohol-related liver damage. Pancreatitis  Research suggests that malnutrition may increase the risk of developing alcoholic pancreatitis. Research suggests that a diet lacking in protein may increase alcohol's damaging effect on the pancreas. Brain  Nutritional deficiencies may have severe effects on brain function. These may be permanent. Specifically, thiamine deficiencies are often seen in alcoholics. They can cause severe neurological problems. These include:  Impaired movement.  Memory loss seen in  Wernicke-Korsakoff syndrome. Pregnancy  Alcohol has toxic effects on fetal development. It causes alcohol-related birth defects. They include fetal alcohol syndrome. Alcohol itself is toxic to the fetus. Also, the nutritional deficiency can affect how the fetus develops. That may compound the risk of developmental damage.  Nutritional needs during pregnancy are 10% to 30% greater than normal. Food  intake can increase by as much as 140% to cover the needs of both mother and fetus. An alcoholic mother`s nutritional problems may adversely affect the nutrition of the fetus. And alcohol itself can also restrict nutrition flow to the fetus. NUTRITIONAL STATUS OF ALCOHOLICS  Techniques for assessing nutritional status include:  Taking body measurements to estimate fat reserves. They include:  Weight.  Height.  Mass.  Skin fold thickness.  Performing blood analysis to provide measurements of circulating:  Proteins.  Vitamins.  Minerals.  These techniques tend to be imprecise. For many nutrients, there is no clear "cut-off" point that would allow an accurate definition of deficiency. So assessing the nutritional status of alcoholics is limited by these techniques. Dietary status may provide information about the risk of developing nutritional problems. Dietary status is assessed by:  Taking patients' dietary histories.  Evaluating the amount and types of food they are eating.  It is difficult to determine what exact amount of alcohol begins to have damaging effects on nutrition. In general, moderate drinkers have 2 drinks or less per day. They seem to be at little risk for nutritional problems. Various medical disorders begin to appear at greater levels.  Research indicates that the majority of even the heaviest drinkers have few obvious nutritional deficiencies. Many alcoholics who are hospitalized for medical complications of their disease do have severe malnutrition. Alcoholics tend to eat poorly. Often they eat less than the amounts of food necessary to provide enough:  Carbohydrates.  Protein.  Fat.  Vitamins A and C.  B vitamins.  Minerals like calcium and iron. Of major concern is alcohol's effect on digesting food and use of nutrients. It may shift a mildly malnourished person toward severe malnutrition. Document Released: 10/19/2004 Document Revised: 03/19/2011  Document Reviewed: 04/04/2005 [facility] Patient Information 2015 [place], [place]. This information is not intended to replace advice given to you by your health care provider. Make sure you discuss any questions you have with your health care provider.

## 2013-08-03 NOTE — ED Provider Notes (Signed)
CSN: [id number]     Arrival date & time 08/03/13  1033 History  This chart was scribed for Robert Manifold, MD by Starleen Arms, ED Scribe. This patient was seen in room APA02/APA02 and the patient's care was started at 1:36 PM.    Chief Complaint  Patient presents with  . Shortness of Breath    The history is provided by the patient. No language interpreter was used.    HPI Comments: Robert [name] is a 58 y.o. male with a history of COPD, substance abuse, Bipolar I, depression, and anxiety who presents to the Emergency Department complaining of constant, unchanged SOB that began this morning.  Patient states that he has been drinking for 5 days, most recently drinking on the way to the ED.  Patient states that when he typically begins to experience alcohol withdrawal, he experiences SOB akin to what he is experiencing today.  Patient states that he had been sober for 6 months previous to relapse 5 days ago.   He expresses an interest in becoming sober currently, but reports no interest in attending a detox facility.   Patient states that he has successfully remained sober in the past for a period of 3 years while prescribed Ativan and Cymbalta.  Patient states that he moved and his new PCP would no longer prescribe Ativan.  He reports he continued being prescribed Cymbalta but was unable to continue to afford the medication.  He reports he has been seen at a mental health facility, but cannot recall where he was seen.  Patient denies needing refills for his COPD medication including [place], a Nebulizer, and an inhaler.  Patient denies an affiliation [place] facility.  PCP: [place].  Past Medical History  Diagnosis Date  . COPD (chronic obstructive pulmonary disease)   . High cholesterol   . Alcohol addiction   . Depression   . Bipolar 1 disorder   . PTSD (post-traumatic stress disorder)   . Anxiety   . Panic    Past Surgical History  Procedure Laterality Date  . Tonsillectomy      Family History  Problem Relation Age of Onset  . Coronary artery disease Mother   . Coronary artery disease Father    History  Substance Use Topics  . Smoking status: Current Every Day Smoker -- 1.00 packs/day    Types: Cigarettes  . Smokeless tobacco: Not on file  . Alcohol Use: Yes    Review of Systems  Respiratory: Positive for shortness of breath.   All other systems reviewed and are negative.     Allergies  Review of patient's allergies indicates no known allergies.  Home Medications   Prior to Admission medications   Medication Sig Start Date End Date Taking? Authorizing Provider  albuterol (PROAIR HFA) 108 (90 BASE) MCG/ACT inhaler Inhale 2 puffs into the lungs every 6 (six) hours as needed for wheezing or shortness of breath.   Yes Historical Provider, MD  ANORO ELLIPTA 62.5-25 MCG/INH AEPB Inhale 1 puff into the lungs every evening.  04/15/13  Yes Historical Provider, MD  diphenhydrAMINE (BENADRYL) 25 mg capsule Take 50 mg by mouth at bedtime.   Yes Historical Provider, MD  ipratropium-albuterol (DUONEB) 0.5-2.5 (3) MG/3ML SOLN Take 3 mLs by nebulization every 6 (six) hours as needed.   Yes Historical Provider, MD  theophylline (THEO-24) 300 MG 24 hr capsule Take 300 mg by mouth daily.   Yes Historical Provider, MD   BP 106/72  Pulse 83  Resp  19  SpO2 95% Physical Exam  Nursing note and vitals reviewed. Constitutional: He is oriented to person, place, and time. He appears well-developed and well-nourished. No distress.  HENT:  Head: Normocephalic and atraumatic.  Eyes: Conjunctivae and EOM are normal.  Neck: Neck supple. No tracheal deviation present.  Cardiovascular: Normal rate.   Pulmonary/Chest: Effort normal. No respiratory distress.  Expiratory wheezing bilaterally.  Speaking in complete sentences.  No accessory muscle usage.    Musculoskeletal: Normal range of motion.  Neurological: He is alert and oriented to person, place, and time.  No appreciable  tremor.   Skin: Skin is warm and dry.  Psychiatric: He has a normal mood and affect. His behavior is normal.    ED Course  Procedures (including critical care time)  DIAGNOSTIC STUDIES: Oxygen Saturation is 95% on RA, normal by my interpretation.    COORDINATION OF CARE:  1:54 PM Discussed plans to order labs and imaging.  Patient acknowledges and agrees with plan.   Labs Review Labs Reviewed  BASIC METABOLIC PANEL - Abnormal; Notable for the following:    Glucose, Bld 130 (*)    All other components within normal limits  ETHANOL - Abnormal; Notable for the following:    Alcohol, Ethyl (B) 239 (*)    All other components within normal limits  CBC WITH DIFFERENTIAL  PRO B NATRIURETIC PEPTIDE    Imaging Review Dg Chest 2 View  08/03/2013   CLINICAL DATA:  Shortness of breath.  EXAM: CHEST  2 VIEW  COMPARISON:  Two-view chest 06/29/2013.  One-view chest 05/10/2013.  FINDINGS: Heart size is normal. Emphysematous changes are again noted bilaterally. Scarring or atelectasis is worse on the right. There is no edema or effusion to suggest failure. The visualized soft tissues and bony thorax are unremarkable.  IMPRESSION: 1. Stable moderate emphysematous changes. 2. No acute cardiopulmonary disease.   Electronically Signed   By: Lawrence Santiago M.D.   On: 08/03/2013 11:30     EKG Interpretation   Date/Time:  Monday August 03 2013 12:46:58 EDT Ventricular Rate:  92 PR Interval:  162 QRS Duration: 85 QT Interval:  355 QTC Calculation: 439 R Axis:   78 Text Interpretation:  Sinus rhythm No significant change since last  tracing Confirmed by Paulmichael Schreck  MD, Kewanda Poland ([id number]) on 08/03/2013 1:52:53 PM      MDM   Final diagnoses:  Alcohol withdrawal, uncomplicated  Alcohol abuse    58 year old male with alcohol abuse and some symptoms withdrawal. Mild tremor on exam. Acting appropriately. No hallucinations. He is complaining of worsening SOB. He does have some SOB, but clinically appears  well. Suspect withdrawal/anxiety is actually driving point of dyspnea. Pt requesting prescription for ativan. Has no interest in going to treatment facility for detox. Of note, I saw pt 1 month ago and he asked for ativan then. At that time, I had discussion with him that he needs to establish care with someone that can write these prescriptions for him if they feel indicated. Again reiterated this, but ultimately did give him another prescription. Encouraged to seek help beyond simply taking benzos. Low suspicion for PE, ACS or other potentially emergent process.  I personally preformed the services scribed in my presence. The recorded information has been reviewed is accurate. Robert Manifold, MD.    Robert Manifold, MD 08/05/13 [phone]

## 2013-08-03 NOTE — ED Notes (Signed)
Pt states he is an alcoholic.  States he drinks a lot and when he tries to stop his breathing gets worse. Pt states he is not going to any non medical facility for his drinking because they will not you any medication to calm your nerves

## 2013-08-26 ENCOUNTER — Emergency Department (HOSPITAL_COMMUNITY)
Admission: EM | Admit: 2013-08-26 | Discharge: 2013-08-26 | Disposition: A | Discharge status: Home / Self Care | Payer: Medicare Other | Attending: Emergency Medicine | Admitting: Emergency Medicine

## 2013-08-26 ENCOUNTER — Encounter (HOSPITAL_COMMUNITY): Payer: Self-pay | Admitting: Emergency Medicine

## 2013-08-26 DIAGNOSIS — J449 Chronic obstructive pulmonary disease, unspecified: Secondary | ICD-10-CM | POA: Insufficient documentation

## 2013-08-26 DIAGNOSIS — F410 Panic disorder [episodic paroxysmal anxiety] without agoraphobia: Secondary | ICD-10-CM | POA: Diagnosis not present

## 2013-08-26 DIAGNOSIS — R443 Hallucinations, unspecified: Secondary | ICD-10-CM | POA: Diagnosis not present

## 2013-08-26 DIAGNOSIS — F1010 Alcohol abuse, uncomplicated: Secondary | ICD-10-CM | POA: Diagnosis not present

## 2013-08-26 DIAGNOSIS — J4489 Other specified chronic obstructive pulmonary disease: Secondary | ICD-10-CM | POA: Insufficient documentation

## 2013-08-26 DIAGNOSIS — Z862 Personal history of diseases of the blood and blood-forming organs and certain disorders involving the immune mechanism: Secondary | ICD-10-CM | POA: Insufficient documentation

## 2013-08-26 DIAGNOSIS — Z008 Encounter for other general examination: Secondary | ICD-10-CM | POA: Diagnosis present

## 2013-08-26 DIAGNOSIS — F17200 Nicotine dependence, unspecified, uncomplicated: Secondary | ICD-10-CM | POA: Diagnosis not present

## 2013-08-26 DIAGNOSIS — Z8639 Personal history of other endocrine, nutritional and metabolic disease: Secondary | ICD-10-CM | POA: Insufficient documentation

## 2013-08-26 DIAGNOSIS — F319 Bipolar disorder, unspecified: Secondary | ICD-10-CM | POA: Insufficient documentation

## 2013-08-26 DIAGNOSIS — R109 Unspecified abdominal pain: Secondary | ICD-10-CM | POA: Insufficient documentation

## 2013-08-26 DIAGNOSIS — Z760 Encounter for issue of repeat prescription: Secondary | ICD-10-CM | POA: Diagnosis not present

## 2013-08-26 DIAGNOSIS — Z79899 Other long term (current) drug therapy: Secondary | ICD-10-CM | POA: Insufficient documentation

## 2013-08-26 LAB — CBC WITH DIFFERENTIAL/PLATELET
BASOS ABS: 0 10*3/uL (ref 0.0–0.1)
Basophils Relative: 0 % (ref 0–1)
EOS PCT: 2 % (ref 0–5)
Eosinophils Absolute: 0.1 10*3/uL (ref 0.0–0.7)
HEMATOCRIT: 42.9 % (ref 39.0–52.0)
Hemoglobin: 14.7 g/dL (ref 13.0–17.0)
Lymphocytes Relative: 33 % (ref 12–46)
Lymphs Abs: 2.6 10*3/uL (ref 0.7–4.0)
MCH: 32.2 pg (ref 26.0–34.0)
MCHC: 34.3 g/dL (ref 30.0–36.0)
MCV: 94.1 fL (ref 78.0–100.0)
Monocytes Absolute: 0.8 10*3/uL (ref 0.1–1.0)
Monocytes Relative: 10 % (ref 3–12)
Neutro Abs: 4.4 10*3/uL (ref 1.7–7.7)
Neutrophils Relative %: 55 % (ref 43–77)
Platelets: 319 10*3/uL (ref 150–400)
RBC: 4.56 MIL/uL (ref 4.22–5.81)
RDW: 14.1 % (ref 11.5–15.5)
WBC: 7.9 10*3/uL (ref 4.0–10.5)

## 2013-08-26 LAB — RAPID URINE DRUG SCREEN, HOSP PERFORMED
Amphetamines: NOT DETECTED
BARBITURATES: NOT DETECTED
BENZODIAZEPINES: NOT DETECTED
Cocaine: NOT DETECTED
Opiates: NOT DETECTED
TETRAHYDROCANNABINOL: NOT DETECTED

## 2013-08-26 LAB — HEPATIC FUNCTION PANEL
ALT: 18 U/L (ref 0–53)
AST: 23 U/L (ref 0–37)
Albumin: 4 g/dL (ref 3.5–5.2)
Alkaline Phosphatase: 64 U/L (ref 39–117)
Bilirubin, Direct: <0.2 mg/dL (ref 0.0–0.3)
TOTAL PROTEIN: 7.7 g/dL (ref 6.0–8.3)
Total Bilirubin: 0.2 mg/dL — ABNORMAL LOW (ref 0.3–1.2)

## 2013-08-26 LAB — BASIC METABOLIC PANEL
ANION GAP: 13 (ref 5–15)
BUN: 9 mg/dL (ref 6–23)
CO2: 28 meq/L (ref 19–32)
CREATININE: 0.82 mg/dL (ref 0.50–1.35)
Calcium: 9.3 mg/dL (ref 8.4–10.5)
Chloride: 98 mEq/L (ref 96–112)
GFR calc non Af Amer: >90 mL/min (ref >90)
Glucose, Bld: 104 mg/dL — ABNORMAL HIGH (ref 70–99)
Potassium: 4.7 mEq/L (ref 3.7–5.3)
Sodium: 139 mEq/L (ref 137–147)

## 2013-08-26 LAB — ETHANOL: Alcohol, Ethyl (B): 269 mg/dL — ABNORMAL HIGH (ref 0–11)

## 2013-08-26 MED ORDER — LORAZEPAM 1 MG PO TABS
1.0000 mg | ORAL_TABLET | Freq: Once | ORAL | Status: AC
  Administered 2013-08-26: 1 mg via ORAL
  Filled 2013-08-26: qty 1

## 2013-08-26 MED ORDER — LORAZEPAM 1 MG PO TABS: 1.0000 mg | ORAL_TABLET | Freq: Three times a day (TID) | ORAL | Status: DC | PRN

## 2013-08-26 NOTE — Discharge Instructions (Signed)
Alcohol Intoxication Alcohol intoxication occurs when the amount of alcohol that a person has consumed impairs his or her ability to mentally and physically function. Alcohol directly impairs the normal chemical activity of the brain. Drinking large amounts of alcohol can lead to changes in mental function and behavior, and it can cause many physical effects that can be harmful.  Alcohol intoxication can range in severity from mild to very severe. Various factors can affect the level of intoxication that occurs, such as the person's age, gender, weight, frequency of alcohol consumption, and the presence of other medical conditions (such as diabetes, seizures, or heart conditions). Dangerous levels of alcohol intoxication may occur when people drink large amounts of alcohol in a short period (binge drinking). Alcohol can also be especially dangerous when combined with certain prescription medicines or "recreational" drugs. SIGNS AND SYMPTOMS Some common signs and symptoms of mild alcohol intoxication include:  Loss of coordination.  Changes in mood and behavior.  Impaired judgment.  Slurred speech. As alcohol intoxication progresses to more severe levels, other signs and symptoms will appear. These may include:  Vomiting.  Confusion and impaired memory.  Slowed breathing.  Seizures.  Loss of consciousness. DIAGNOSIS  Your health care provider will take a medical history and perform a physical exam. You will be asked about the amount and type of alcohol you have consumed. Blood tests will be done to measure the concentration of alcohol in your blood. In many places, your blood alcohol level must be lower than 80 mg/dL (0.08%) to legally drive. However, many dangerous effects of alcohol can occur at much lower levels.  TREATMENT  People with alcohol intoxication often do not require treatment. Most of the effects of alcohol intoxication are temporary, and they go away as the alcohol naturally  leaves the body. Your health care provider will monitor your condition until you are stable enough to go home. Fluids are sometimes given through an IV access tube to help prevent dehydration.  HOME CARE INSTRUCTIONS  Do not drive after drinking alcohol.  Stay hydrated. Drink enough water and fluids to keep your urine clear or pale yellow. Avoid caffeine.   Only take over-the-counter or prescription medicines as directed by your health care provider.  SEEK MEDICAL CARE IF:   You have persistent vomiting.   You do not feel better after a few days.  You have frequent alcohol intoxication. Your health care provider can help determine if you should see a substance use treatment counselor. SEEK IMMEDIATE MEDICAL CARE IF:   You become shaky or tremble when you try to stop drinking.   You shake uncontrollably (seizure).   You throw up (vomit) blood. This may be bright red or may look like black coffee grounds.   You have blood in your stool. This may be bright red or may appear as a black, tarry, bad smelling stool.   You become lightheaded or faint.  MAKE SURE YOU:   Understand these instructions.  Will watch your condition.  Will get help right away if you are not doing well or get worse. Document Released: 10/04/2004 Document Revised: 08/27/2012 Document Reviewed: 05/30/2012 [facility] Patient Information 2015 [place], [place]. This information is not intended to replace advice given to you by your health care provider. Make sure you discuss any questions you have with your health care provider.  Medication Refill, Emergency Department We have refilled your medication today as a courtesy to you. It is best for your medical care, however, to take care  of getting refills done through your primary caregiver's office. They have your records and can do a better job of follow-up than we can in the emergency department. On maintenance medications, we often only prescribe enough  medications to get you by until you are able to see your regular caregiver. This is a more expensive way to refill medications. In the future, please plan for refills so that you will not have to use the emergency department for this. Thank you for your help. Your help allows [place] to better take care of the daily emergencies that enter our department. Document Released: 04/13/2003 Document Revised: 03/19/2011 Document Reviewed: 04/03/2013 [facility] Patient Information 2015 [place], [place]. This information is not intended to replace advice given to you by your health care provider. Make sure you discuss any questions you have with your health care provider.   Emergency Department Resource Guide 1) Find a Doctor and Pay Out of Pocket Although you won't have to find out who is covered by your insurance plan, it is a good idea to ask around and get recommendations. You will then need to call the office and see if the doctor you have chosen will accept you as a new patient and what types of options they offer for patients who are self-pay. Some doctors offer discounts or will set up payment plans for their patients who do not have insurance, but you will need to ask so you aren't surprised when you get to your appointment.  2) Contact Your Local Health Department Not all health departments have doctors that can see patients for sick visits, but many do, so it is worth a call to see if yours does. If you don't know where your local health department is, you can check in your phone book. The CDC also has a tool to help you locate your state's health department, and many state websites also have listings of all of their local health departments.  3) Find a [facility] If your illness is not likely to be very severe or complicated, you may want to try a walk in clinic. These are popping up all over the country in pharmacies, drugstores, and shopping centers. They're usually staffed by nurse practitioners or  physician assistants that have been trained to treat common illnesses and complaints. They're usually fairly quick and inexpensive. However, if you have serious medical issues or chronic medical problems, these are probably not your best option.  No Primary Care Doctor: - Call Health Connect at  [phone] - they can help you locate a primary care doctor that  accepts your insurance, provides certain services, etc. - Physician Referral Service- [phone]  Chronic Pain Problems: Organization         Address  Phone   Notes  [facility]  [phone] Patients need to be referred by their primary care doctor.   Medication Assistance: Organization         Address  Phone   Notes  [facility] Medication Health [address] [place], [place] [address] [phone] --Must be a resident of [facility] -- Must have NO insurance coverage whatsoever (no Medicaid/ Medicare, etc.) -- The pt. MUST have a primary care doctor that directs their care regularly and follows them in the community   MedAssist  [phone]   [employer]  [phone]    Agencies that provide inexpensive medical care: Patent attorney  Notes  [place]  [phone]   [facility]    [phone]   [facility] [place], [place] [address] [phone]   [place] [address], [place] [phone]   Planned Parenthood    [phone]   [facility]    [phone]   [place] and [place] [address], [place] Phone:  [phone], Fax:  [phone] Hours of Operation:  9 am - 6 pm, M-F.  Also accepts Medicaid/Medicare and self-pay.  [facility] for [address], [address], [place] Phone: [phone], Fax: [phone]. Hours of Operation:  8:30 am - 5:30 pm, M-F.   Also accepts Medicaid and self-pay.  [facility] [place] [address], [place] Phone: [phone]   [place], [place], [place] [phone], Ext. [phone] Mondays & Thursdays: 7-9 AM.  First 15 patients are seen on a first come, first serve basis.    [place] Providers:  Organization         Address  Phone   Notes  [facility] [address], [place] [phone] Also accepts self-pay patients.  [facility] [address], [place]  [phone]   [place], [address], [place] [phone]   [facility] [address], [place] [phone]   Lucianne Lei [address], [place]   [phone] Only accepts [place] Access [place] patients after they have their name applied to their card.   Self-Pay (no insurance) in [facility]:  Organization         Address  Phone   Notes  Sickle Cell Patients, [facility] [place] [phone]   [facility] [place] [phone]   [facility] [place]  [place], [place], [place] [phone]   [facility]/Dr. Osei-Bonsu  [address] [place] or [address], [place] [phone] Phone number for both [place] and [place] locations is the same.  [facility] [address] [place] [phone]   [facility] [address] [place] or [address] [phone]   [facility] [address], [place] [phone], phone; [phone], fax Sees patients 1st and 3rd Saturday of every month.  Must not qualify for public or private insurance (i.e. Medicaid, Medicare, [place] Health Choice, Veterans'  Benefits)  Household income should be no more than 200% of the poverty level The clinic cannot treat you if you are pregnant or think you are pregnant  Sexually transmitted diseases are not treated at the clinic.    Dental Care: Organization         Address  Phone  Notes  [facility] Department of [facility] [place] [phone] Accepts children up to age 44 who are enrolled in [place] or [place]; pregnant women with a Medicaid card; and children who have applied for Medicaid or [place] Health Choice, but were declined, whose parents can pay a reduced fee at time of service.  [facility]  Department of [facility]  [address], [place] [phone] Accepts children up to age 62 who are enrolled in [place] or [place]; pregnant women with a Medicaid card; and children who have applied for Medicaid or [place] Health Choice, but were declined, whose parents can pay a reduced fee at time of service.  [place] Adult Dental Access PROGRAM  [place] [phone] Patients are seen by appointment only. Walk-ins are not accepted. [place] will see patients 66 years of age and older. Monday - Tuesday (8am-5pm) Most Wednesdays (8:30-5pm) $30 per visit, cash only  [facility] Adult Dental Access PROGRAM  [address], [facility] [phone] Patients are seen by appointment only. Walk-ins are not accepted. [place] will see patients 50 years of age and older. One Wednesday Evening (Monthly: Volunteer Based).  $30 per visit, cash only  [place]  [phone] for adults; Children under age 74, call Graduate Pediatric Dentistry at [phone]. Children aged 79-14, please call [phone] to request a pediatric application.  Dental services are provided in all areas of dental care including fillings, crowns and bridges, complete and partial  dentures, implants, gum treatment, root canals, and extractions. Preventive care is also provided. Treatment is provided to both adults and children. Patients are selected via a lottery and there is often a waiting list.   [facility] [address] [place]  [phone] [email or web address]   [facility] [address] [place], [place] [phone], Ext. [phone] Second and Fourth Thursday of each month, opens at 6:30 AM; Clinic ends at 9 AM.  Patients are seen on a first-come first-served basis, and a limited number are seen during each clinic.   [facility]  [address] Hillard Danker [place], [place] [phone]   Eligibility Requirements You must have lived in [place], [place], or [place] counties for at least the last three months.   You cannot be eligible for state or federal sponsored [employer], including [employer], [place], or [employer].   You generally cannot be eligible for healthcare insurance through your employer.    How to apply: Eligibility screenings are held every Tuesday and Wednesday afternoon from 1:00 pm until 4:00 pm. You do not need an appointment for the interview!  [facility] [address], [place], [place]   [place]  [place] Department  [place]  [phone]    Behavioral Health Resources in the Community: Intensive Outpatient Programs Organization         Address  Phone  Notes  Quinton Catron. [address], [place], [place] [phone]   [facility] [address], [place], [place]   ADS: Alcohol & Drug Svcs [address], [place], [place]   [place] [address]  [place], [place] or [phone]   Substance Abuse Resources Organization          Address  Phone  Notes  Alcohol and Drug Services  2312974940   [place]  [phone]   [facility] [place]   Chinita Pester  [phone]   Residential & Outpatient Substance Abuse Program  [phone]   Psychological Services Organization         Address  Phone  Notes  [facility]  [place]   [place] [address], [place] or [phone]    Mobile Crisis Teams Organization         Address  Phone  Notes  [facility], Mobile Crisis Care Unit  [phone]   [facility] Psychotherapeutic Services  [address] [place], [place]   [place] [address] [place] [phone]    Self-Help/Support Groups Organization         Address  Phone             Notes  [place]. of [place] - variety of support groups  [place] Call for more information  Narcotics Anonymous (NA), [facility] [address], [employer] [place]  2 meetings at this location   Special educational needs teacher         Address  Phone  Notes  ASAP Residential Treatment [place],    [place]  [phone]   [facility]  [address], [place] [address], [place], [place]   [place], [place] [phone] Admissions: 8am-3pm M-F  Incentives Substance [place] [address],    [place], [place] [phone]   [facility] [address] [place], [place], [place]   [facility] [address],  [place], [place]   [facility]ight Programs - Intensive Outpatient [address], [place], [place]   [facility] ([place].,  [place], [place] [phone] or [phone]   [facility]idential Treatment Services (RTS) [address], [place], [place] Accepts Medicaid  [facility] [place] [address]  [place] 1-4122548320 Substance Abuse/Addiction Treatment   [facility] Organization         Address  Phone  Notes  [facility]  [phone]   Domenic Schwab, PhD [address] Arlis Porta [place], [place]   [phone] or [phone]   [place], [place] [phone]   [facility] [address], [place], [place] [phone] Insurance/Medicaid/sponsorship through [facility] and Families [address], [address]                                    [place], [place] [phone] [place] [address], [place] [phone]    Dr. Adele Schilder  [phone]   [facility] Dept. 1) [address],  2) [place] 3)  [address], [place] [phone]  [phone]   [place] [phone] or [phone] (After Hours)

## 2013-08-26 NOTE — ED Notes (Signed)
Pt reports has been out of his anxiety medication for the past few months and has been drinking alcohol.  Reports last had etoh on the way to hospital this morning.  Pt denies si or hi.  Pt requesting help to stop drinking etoh.  Reports he doesn't drink when he has his anxiety medication.

## 2013-08-26 NOTE — ED Provider Notes (Signed)
CSN: [id number]     Arrival date & time 08/26/13  0750 History   First MD Initiated Contact with Patient 08/26/13 [phone]     Chief Complaint  Patient presents with  . V70.1     (Consider location/radiation/quality/duration/timing/severity/associated sxs/prior Treatment) HPI   Robert [name] is a 58 y.o. male who presents to the Emergency Department requesting refill of his Ativan.  He states that he was seen here last month and given a refill, but has been without it for approximately 2 weeks.  He also states that he has been drinking beer daily since being without his medication which he states is a recurrent problem, but only when he is out of his medication.  Patient also admits to "seeing my dead brother this morning" which he admits has also happened in the past when he has been drinking.  He states that his PMD in [place], usually prescribes his medications, but he missed his last appt and had to reschedule.  Patient also reports intermittent upper and right abdominal pain.  He denies SI or HI thoughts, vomiting, fever , IV drug, or cocaine use, or auditory halluncinations   Past Medical History  Diagnosis Date  . COPD (chronic obstructive pulmonary disease)   . High cholesterol   . Alcohol addiction   . Depression   . Bipolar 1 disorder   . PTSD (post-traumatic stress disorder)   . Anxiety   . Panic    Past Surgical History  Procedure Laterality Date  . Tonsillectomy     Family History  Problem Relation Age of Onset  . Coronary artery disease Mother   . Coronary artery disease Father    History  Substance Use Topics  . Smoking status: Current Every Day Smoker -- 1.00 packs/day    Types: Cigarettes  . Smokeless tobacco: Not on file  . Alcohol Use: Yes    Review of Systems  Constitutional: Negative for fever, chills and appetite change.  HENT: Negative for trouble swallowing.   Respiratory: Negative for chest tightness and shortness of breath.    Cardiovascular: Negative for chest pain.  Gastrointestinal: Positive for abdominal pain. Negative for nausea, vomiting, blood in stool and abdominal distention.  Genitourinary: Negative for dysuria, hematuria, flank pain, decreased urine volume and difficulty urinating.  Musculoskeletal: Negative for arthralgias, joint swelling and neck pain.  Skin: Negative for color change and rash.  Neurological: Negative for dizziness, seizures, syncope, weakness, numbness and headaches.  Hematological: Negative for adenopathy.  Psychiatric/Behavioral: Positive for hallucinations. Negative for suicidal ideas and self-injury.       "saw my dead brother this morning"  All other systems reviewed and are negative.     Allergies  Review of patient's allergies indicates no known allergies.  Home Medications   Prior to Admission medications   Medication Sig Start Date End Date Taking? Authorizing Provider  albuterol (PROAIR HFA) 108 (90 BASE) MCG/ACT inhaler Inhale 2 puffs into the lungs every 6 (six) hours as needed for wheezing or shortness of breath.    Historical Provider, MD  ANORO ELLIPTA 62.5-25 MCG/INH AEPB Inhale 1 puff into the lungs every evening.  04/15/13   Historical Provider, MD  diphenhydrAMINE (BENADRYL) 25 mg capsule Take 50 mg by mouth at bedtime.    Historical Provider, MD  ipratropium-albuterol (DUONEB) 0.5-2.5 (3) MG/3ML SOLN Take 3 mLs by nebulization every 6 (six) hours as needed.    Historical Provider, MD  LORazepam (ATIVAN) 1 MG tablet Take 1 tablet (1 mg total) by  mouth every 8 (eight) hours as needed for anxiety. 08/03/13   [name], MD  theophylline (THEO-24) 300 MG 24 hr capsule Take 300 mg by mouth daily.    Historical Provider, MD   BP 104/58  Pulse 92  Temp(Src) 98 F (36.7 C) (Oral)  Resp 20  Ht 5\' 8"  (1.727 m)  Wt 200 lb (90.719 kg)  BMI 30.42 kg/m2  SpO2 95% Physical Exam  Nursing note and vitals reviewed. Constitutional: He is oriented to person, place,  and time. He appears well-developed and well-nourished. No distress.  HENT:  Head: Normocephalic and atraumatic.  Eyes: Conjunctivae and EOM are normal. Pupils are equal, round, and reactive to light. No scleral icterus.  Neck: Normal range of motion. Neck supple. No JVD present.  Cardiovascular: Normal rate, regular rhythm, normal heart sounds and intact distal pulses.   No murmur heard. Pulmonary/Chest: Effort normal and breath sounds normal. No respiratory distress. He exhibits no tenderness.  Abdominal: Soft. He exhibits no distension and no mass. There is tenderness. There is no rebound and no guarding.  Musculoskeletal: Normal range of motion.  Neurological: He is alert and oriented to person, place, and time. He exhibits normal muscle tone. Coordination normal.  Skin: Skin is warm and dry. No rash noted.  Psychiatric: He has a normal mood and affect. His behavior is normal. Thought content normal.    ED Course  Procedures (including critical care time) Labs Review Labs Reviewed  BASIC METABOLIC PANEL - Abnormal; Notable for the following:    Glucose, Bld 104 (*)    All other components within normal limits  ETHANOL - Abnormal; Notable for the following:    Alcohol, Ethyl (B) 269 (*)    All other components within normal limits  HEPATIC FUNCTION PANEL - Abnormal; Notable for the following:    Total Bilirubin 0.2 (*)    All other components within normal limits  CBC WITH DIFFERENTIAL  URINE RAPID DRUG SCREEN (HOSP PERFORMED)    Imaging Review No results found.   EKG Interpretation None      MDM   Final diagnoses:  Medication refill  Alcohol abuse    Previous Ed chart reviewed.  Patient is well appearing on this visit, lucid and does not appear to be in withdrawal. Family members at bedside.  He admits to daily alcohol use, but states he only drinks when he does not have his medication. He denies SI or HI at this time and when offered help with detox, patient  declines. This is the patient's second ED visit requesting Ativan and I have advised him that I will give him a short course, but he will need to establish care with another mental health facility or PMD.  Patient verbalized understanding of this and agrees to plan.      Tyrese Capriotti L. Vanessa [place], PA-C 08/27/13 2221

## 2013-08-26 NOTE — ED Notes (Addendum)
Pt has been without medications since last visit here. Had an appointment with PMD in [place] but was 10 min. Late and would not see pt. Since pt has been without anxiety meds, he has been drinking daily. Family states visual hallucinations. Pt states he saw his brother, whom has passed away. Pt denies SI at this time. States, "I've just been thinking, 'How do I get out of this'."  Pt states new pain to head and back, rated at 10. Pt also states he only has visual hallucinations when drinking alcohol.

## 2013-08-28 NOTE — ED Provider Notes (Signed)
Medical screening examination/treatment/procedure(s) were performed by non-physician practitioner and as supervising physician I was immediately available for consultation/collaboration.   EKG Interpretation None        Merryl Hacker, MD 08/28/13 1446

## 2013-11-14 ENCOUNTER — Emergency Department: Payer: Self-pay | Admitting: Internal Medicine

## 2013-11-14 LAB — DRUG SCREEN, URINE
Amphetamines, Ur Screen: NEGATIVE (ref ?–1000)
BARBITURATES, UR SCREEN: NEGATIVE (ref ?–200)
BENZODIAZEPINE, UR SCRN: NEGATIVE (ref ?–200)
CANNABINOID 50 NG, UR ~~LOC~~: NEGATIVE (ref ?–50)
Cocaine Metabolite,Ur ~~LOC~~: NEGATIVE (ref ?–300)
MDMA (ECSTASY) UR SCREEN: NEGATIVE (ref ?–500)
Methadone, Ur Screen: NEGATIVE (ref ?–300)
Opiate, Ur Screen: NEGATIVE (ref ?–300)
PHENCYCLIDINE (PCP) UR S: NEGATIVE (ref ?–25)
Tricyclic, Ur Screen: NEGATIVE (ref ?–1000)

## 2013-11-14 LAB — COMPREHENSIVE METABOLIC PANEL
ALK PHOS: 71 U/L
ALT: 41 U/L
Albumin: 3.8 g/dL (ref 3.4–5.0)
Anion Gap: 10 (ref 7–16)
BILIRUBIN TOTAL: 0.3 mg/dL (ref 0.2–1.0)
BUN: 8 mg/dL (ref 7–18)
CALCIUM: 9 mg/dL (ref 8.5–10.1)
CO2: 28 mmol/L (ref 21–32)
Chloride: 98 mmol/L (ref 98–107)
Creatinine: 0.87 mg/dL (ref 0.60–1.30)
EGFR (Non-African Amer.): >60
Glucose: 89 mg/dL (ref 65–99)
Osmolality: 270 (ref 275–301)
Potassium: 4.5 mmol/L (ref 3.5–5.1)
SGOT(AST): 34 U/L (ref 15–37)
SODIUM: 136 mmol/L (ref 136–145)
TOTAL PROTEIN: 7.8 g/dL (ref 6.4–8.2)

## 2013-11-14 LAB — CBC
HCT: 44 % (ref 40.0–52.0)
HGB: 14.8 g/dL (ref 13.0–18.0)
MCH: 31.7 pg (ref 26.0–34.0)
MCHC: 33.7 g/dL (ref 32.0–36.0)
MCV: 94 fL (ref 80–100)
Platelet: 375 10*3/uL (ref 150–440)
RBC: 4.67 10*6/uL (ref 4.40–5.90)
RDW: 13.2 % (ref 11.5–14.5)
WBC: 8.6 10*3/uL (ref 3.8–10.6)

## 2013-11-14 LAB — URINALYSIS, COMPLETE
Bacteria: NONE SEEN
Bilirubin,UR: NEGATIVE
Glucose,UR: NEGATIVE mg/dL (ref 0–75)
KETONE: NEGATIVE
Leukocyte Esterase: NEGATIVE
Nitrite: NEGATIVE
Ph: 5 (ref 4.5–8.0)
Protein: NEGATIVE
RBC, UR: NONE SEEN /HPF (ref 0–5)
SPECIFIC GRAVITY: 1.003 (ref 1.003–1.030)
Squamous Epithelial: NONE SEEN

## 2013-11-14 LAB — SALICYLATE LEVEL: SALICYLATES, SERUM: 4 mg/dL — AB

## 2013-11-14 LAB — ETHANOL: Ethanol: 125 mg/dL

## 2013-11-14 LAB — ACETAMINOPHEN LEVEL: Acetaminophen: <2 ug/mL

## 2013-12-15 ENCOUNTER — Emergency Department (HOSPITAL_COMMUNITY): Payer: Medicare Other

## 2013-12-15 ENCOUNTER — Emergency Department (HOSPITAL_COMMUNITY)
Admission: EM | Admit: 2013-12-15 | Discharge: 2013-12-15 | Disposition: A | Discharge status: Home / Self Care | Payer: Medicare Other | Attending: Emergency Medicine | Admitting: Emergency Medicine

## 2013-12-15 ENCOUNTER — Encounter (HOSPITAL_COMMUNITY): Payer: Self-pay

## 2013-12-15 DIAGNOSIS — Z8639 Personal history of other endocrine, nutritional and metabolic disease: Secondary | ICD-10-CM | POA: Diagnosis not present

## 2013-12-15 DIAGNOSIS — W1830XA Fall on same level, unspecified, initial encounter: Secondary | ICD-10-CM | POA: Insufficient documentation

## 2013-12-15 DIAGNOSIS — R52 Pain, unspecified: Secondary | ICD-10-CM

## 2013-12-15 DIAGNOSIS — F329 Major depressive disorder, single episode, unspecified: Secondary | ICD-10-CM | POA: Diagnosis not present

## 2013-12-15 DIAGNOSIS — Y9289 Other specified places as the place of occurrence of the external cause: Secondary | ICD-10-CM | POA: Diagnosis not present

## 2013-12-15 DIAGNOSIS — Y998 Other external cause status: Secondary | ICD-10-CM | POA: Diagnosis not present

## 2013-12-15 DIAGNOSIS — Z23 Encounter for immunization: Secondary | ICD-10-CM | POA: Diagnosis not present

## 2013-12-15 DIAGNOSIS — Z791 Long term (current) use of non-steroidal anti-inflammatories (NSAID): Secondary | ICD-10-CM | POA: Diagnosis not present

## 2013-12-15 DIAGNOSIS — Z720 Tobacco use: Secondary | ICD-10-CM | POA: Insufficient documentation

## 2013-12-15 DIAGNOSIS — F419 Anxiety disorder, unspecified: Secondary | ICD-10-CM | POA: Insufficient documentation

## 2013-12-15 DIAGNOSIS — S6991XA Unspecified injury of right wrist, hand and finger(s), initial encounter: Secondary | ICD-10-CM | POA: Diagnosis present

## 2013-12-15 DIAGNOSIS — S60221A Contusion of right hand, initial encounter: Secondary | ICD-10-CM | POA: Insufficient documentation

## 2013-12-15 DIAGNOSIS — J449 Chronic obstructive pulmonary disease, unspecified: Secondary | ICD-10-CM | POA: Diagnosis not present

## 2013-12-15 DIAGNOSIS — Y9389 Activity, other specified: Secondary | ICD-10-CM | POA: Diagnosis not present

## 2013-12-15 DIAGNOSIS — Z79899 Other long term (current) drug therapy: Secondary | ICD-10-CM | POA: Insufficient documentation

## 2013-12-15 MED ORDER — TETANUS-DIPHTH-ACELL PERTUSSIS 5-2.5-18.5 LF-MCG/0.5 IM SUSP
0.5000 mL | Freq: Once | INTRAMUSCULAR | Status: AC
  Administered 2013-12-15: 0.5 mL via INTRAMUSCULAR
  Filled 2013-12-15: qty 0.5

## 2013-12-15 MED ORDER — OXYCODONE-ACETAMINOPHEN 5-325 MG PO TABS: 1.0000 | ORAL_TABLET | ORAL | Status: DC | PRN

## 2013-12-15 MED ORDER — OXYCODONE-ACETAMINOPHEN 5-325 MG PO TABS
1.0000 | ORAL_TABLET | Freq: Once | ORAL | Status: AC
  Administered 2013-12-15: 1 via ORAL
  Filled 2013-12-15: qty 1

## 2013-12-15 NOTE — Discharge Instructions (Signed)
Contusion °A contusion is a deep bruise. Contusions happen when an injury causes bleeding under the skin. Signs of bruising include pain, puffiness (swelling), and discolored skin. The contusion may turn blue, purple, or yellow. °HOME CARE  °· Put ice on the injured area. °¨ Put ice in a plastic bag. °¨ Place a towel between your skin and the bag. °¨ Leave the ice on for 15-20 minutes, 03-04 times a day. °· Only take medicine as told by your doctor. °· Rest the injured area. °· If possible, raise (elevate) the injured area to lessen puffiness. °GET HELP RIGHT AWAY IF:  °· You have more bruising or puffiness. °· You have pain that is getting worse. °· Your puffiness or pain is not helped by medicine. °MAKE SURE YOU:  °· Understand these instructions. °· Will watch your condition. °· Will get help right away if you are not doing well or get worse. °Document Released: 06/13/2007 Document Revised: 03/19/2011 Document Reviewed: 10/30/2010 °[employer]® Patient Information ©2015 [employer]. This information is not intended to replace advice given to you by your health care provider. Make sure you discuss any questions you have with your health care provider. ° °

## 2013-12-15 NOTE — ED Notes (Signed)
Pt reports had been drinking and fell 2 days ago.  Reports landed on r hand.  Hand red, swollen, and tender.  Radial pulse present.  Capillary refill 2 sec.

## 2013-12-17 NOTE — ED Provider Notes (Signed)
CSN: [id number]     Arrival date & time 12/15/13  1130 History   First MD Initiated Contact with Patient 12/15/13 1158     Chief Complaint  Patient presents with  . Fall     (Consider location/radiation/quality/duration/timing/severity/associated sxs/prior Treatment) HPI  Robert [name] is a 58 y.o. male who presents to the Emergency Department complaining of right hand pain and swelling.  He states that he was drinking alcohol and fell onto a clenched right hand 2 days prior to ED arrival.  He reports pain and swelling immediately, but he has been able to move his fingers and wrist without severe pain.  He states that what concerned him was that he began to notice bruising to his palm.  He reports applying heat to his hand without relief.  Taking ibuprofen for pain.  He denies numbness or weakness, other injuries,    Past Medical History  Diagnosis Date  . COPD (chronic obstructive pulmonary disease)   . High cholesterol   . Alcohol addiction   . Depression   . Bipolar 1 disorder   . PTSD (post-traumatic stress disorder)   . Anxiety   . Panic    Past Surgical History  Procedure Laterality Date  . Tonsillectomy     Family History  Problem Relation Age of Onset  . Coronary artery disease Mother   . Coronary artery disease Father    History  Substance Use Topics  . Smoking status: Current Every Day Smoker -- 1.00 packs/day    Types: Cigarettes  . Smokeless tobacco: Not on file  . Alcohol Use: Yes     Comment: heavily    Review of Systems  Constitutional: Negative for fever and chills.  Cardiovascular: Negative for chest pain.  Genitourinary: Negative for dysuria and difficulty urinating.  Musculoskeletal: Positive for joint swelling and arthralgias (right hand pain and swelling). Negative for neck pain.  Skin: Positive for color change. Negative for wound.  Neurological: Negative for dizziness, syncope, weakness, numbness and headaches.  All other systems reviewed  and are negative.     Allergies  Review of patient's allergies indicates no known allergies.  Home Medications   Prior to Admission medications   Medication Sig Start Date End Date Taking? Authorizing Provider  albuterol (PROAIR HFA) 108 (90 BASE) MCG/ACT inhaler Inhale 2 puffs into the lungs every 6 (six) hours as needed for wheezing or shortness of breath.   Yes Historical Provider, MD  ANORO ELLIPTA 62.5-25 MCG/INH AEPB Inhale 1 puff into the lungs every evening.  04/15/13  Yes Historical Provider, MD  diphenhydrAMINE (BENADRYL) 25 mg capsule Take 50 mg by mouth at bedtime.   Yes Historical Provider, MD  ibuprofen (ADVIL,MOTRIN) 200 MG tablet Take 400 mg by mouth daily as needed for headache.   Yes Historical Provider, MD  ipratropium-albuterol (DUONEB) 0.5-2.5 (3) MG/3ML SOLN Take 3 mLs by nebulization every 6 (six) hours as needed (shortness of breath).    Yes Historical Provider, MD  LORazepam (ATIVAN) 1 MG tablet Take 1 tablet (1 mg total) by mouth 3 (three) times daily as needed for anxiety. 08/26/13  Yes Armeda Plumb L. Kysen [place], PA-C  Multiple Vitamin (MULTIVITAMIN WITH MINERALS) TABS tablet Take 1 tablet by mouth daily.   Yes Historical Provider, MD  Naphazoline HCl (CLEAR EYES OP) Place 2 drops into both eyes daily as needed (red eyes).   Yes Historical Provider, MD  theophylline (THEO-24) 300 MG 24 hr capsule Take 300 mg by mouth daily.   Yes Historical  Provider, MD  oxyCODONE-acetaminophen (PERCOCET/ROXICET) 5-325 MG per tablet Take 1 tablet by mouth every 4 (four) hours as needed. 12/15/13   Karisa Nesser L. Lailany Enoch, PA-C   BP 129/71 mmHg  Pulse 103  Temp(Src) 97.8 F (36.6 C) (Oral)  Resp 18  SpO2 100% Physical Exam  Constitutional: He is oriented to person, place, and time. He appears well-developed and well-nourished. No distress.  HENT:  Head: Normocephalic and atraumatic.  Neck: Normal range of motion. Neck supple.  Cardiovascular: Normal rate, regular rhythm and normal heart  sounds.   No murmur heard. Pulmonary/Chest: Effort normal and breath sounds normal. No respiratory distress. He exhibits no tenderness.  Patient is on continuous home O2 by [place]  Musculoskeletal: He exhibits edema and tenderness.  Moderate STS of the dorsal right hand.  Radial pulse is brisk, distal sensation intact.  CR< 2 sec.  No bony deformity.  Patient has full ROM of the wrist and fingers. Compartments soft. No proximal tenderness  Neurological: He is alert and oriented to person, place, and time. He exhibits normal muscle tone. Coordination normal.  Skin: Skin is warm and dry.  Nursing note and vitals reviewed.   ED Course  Procedures (including critical care time) Labs Review Labs Reviewed - No data to display  Imaging Review Dg Hand Complete Right  12/15/2013   CLINICAL DATA:  Pain and swelling and redness and laceration secondary to a fall on a porch deck going down steps 2 days ago.  EXAM: RIGHT HAND - COMPLETE 3+ VIEW  COMPARISON:  None.  FINDINGS: There is no fracture or dislocation. There is slight dorsal spurring at 1 of the carpal/metacarpal joint seen only on the lateral oblique view. There is prominent soft tissue swelling of the hand.  There are slight arthritic changes of the first second and third metacarpophalangeal joints. There is irregularity of the tuft of the distal phalanx of the index finger, possibly related to prior trauma.  IMPRESSION: No acute osseous abnormalities. Soft tissue swelling. No visible foreign bodies.   Electronically Signed   By: Rozetta Nunnery M.D.   On: 12/15/2013 12:23     EKG Interpretation None      MDM   Final diagnoses:  Contusion, hand, right, initial encounter    Fall injury to right hand.  XR neg for fx.  NV intact.  Pt has full ROM of the wrist and fingers.  Compartments soft.    Bulky dressing applied, pt advised to d/c heat, elevate and apply ice instead, referral for ortho.    Nannie Starzyk L. Vanessa [place], PA-C 12/17/13 1548

## 2013-12-19 NOTE — ED Provider Notes (Signed)
  Medical screening examination/treatment/procedure(s) were performed by non-physician practitioner and as supervising physician I was immediately available for consultation/collaboration.   EKG Interpretation None         Carmin Muskrat, MD 12/19/13 [phone]

## 2014-02-23 ENCOUNTER — Encounter (HOSPITAL_COMMUNITY): Payer: Self-pay | Admitting: *Deleted

## 2014-02-23 ENCOUNTER — Emergency Department (HOSPITAL_COMMUNITY)
Admission: EM | Admit: 2014-02-23 | Discharge: 2014-02-23 | Disposition: A | Discharge status: Home / Self Care | Payer: Medicare Other | Attending: Emergency Medicine | Admitting: Emergency Medicine

## 2014-02-23 DIAGNOSIS — Z720 Tobacco use: Secondary | ICD-10-CM | POA: Diagnosis not present

## 2014-02-23 DIAGNOSIS — F1010 Alcohol abuse, uncomplicated: Secondary | ICD-10-CM

## 2014-02-23 DIAGNOSIS — F410 Panic disorder [episodic paroxysmal anxiety] without agoraphobia: Secondary | ICD-10-CM | POA: Diagnosis not present

## 2014-02-23 DIAGNOSIS — J449 Chronic obstructive pulmonary disease, unspecified: Secondary | ICD-10-CM | POA: Insufficient documentation

## 2014-02-23 DIAGNOSIS — F319 Bipolar disorder, unspecified: Secondary | ICD-10-CM | POA: Diagnosis not present

## 2014-02-23 DIAGNOSIS — Z79899 Other long term (current) drug therapy: Secondary | ICD-10-CM | POA: Insufficient documentation

## 2014-02-23 DIAGNOSIS — Z8639 Personal history of other endocrine, nutritional and metabolic disease: Secondary | ICD-10-CM | POA: Diagnosis not present

## 2014-02-23 DIAGNOSIS — R000 Tachycardia, unspecified: Secondary | ICD-10-CM | POA: Insufficient documentation

## 2014-02-23 LAB — COMPREHENSIVE METABOLIC PANEL
ALBUMIN: 4.3 g/dL (ref 3.5–5.2)
ALT: 22 U/L (ref 0–53)
AST: 25 U/L (ref 0–37)
Alkaline Phosphatase: 56 U/L (ref 39–117)
Anion gap: 5 (ref 5–15)
BUN: 10 mg/dL (ref 6–23)
CALCIUM: 8.9 mg/dL (ref 8.4–10.5)
CO2: 26 mmol/L (ref 19–32)
Chloride: 103 mmol/L (ref 96–112)
Creatinine, Ser: 0.75 mg/dL (ref 0.50–1.35)
GFR calc Af Amer: >90 mL/min (ref >90)
GFR calc non Af Amer: >90 mL/min (ref >90)
Glucose, Bld: 105 mg/dL — ABNORMAL HIGH (ref 70–99)
POTASSIUM: 4.3 mmol/L (ref 3.5–5.1)
SODIUM: 134 mmol/L — AB (ref 135–145)
TOTAL PROTEIN: 7.1 g/dL (ref 6.0–8.3)
Total Bilirubin: 0.4 mg/dL (ref 0.3–1.2)

## 2014-02-23 LAB — CBC WITH DIFFERENTIAL/PLATELET
Basophils Absolute: 0 10*3/uL (ref 0.0–0.1)
Basophils Relative: 0 % (ref 0–1)
Eosinophils Absolute: 0.2 10*3/uL (ref 0.0–0.7)
Eosinophils Relative: 4 % (ref 0–5)
HEMATOCRIT: 41.1 % (ref 39.0–52.0)
HEMOGLOBIN: 13.8 g/dL (ref 13.0–17.0)
LYMPHS PCT: 35 % (ref 12–46)
Lymphs Abs: 1.6 10*3/uL (ref 0.7–4.0)
MCH: 31 pg (ref 26.0–34.0)
MCHC: 33.6 g/dL (ref 30.0–36.0)
MCV: 92.4 fL (ref 78.0–100.0)
Monocytes Absolute: 0.6 10*3/uL (ref 0.1–1.0)
Monocytes Relative: 12 % (ref 3–12)
NEUTROS ABS: 2.3 10*3/uL (ref 1.7–7.7)
Neutrophils Relative %: 49 % (ref 43–77)
Platelets: 272 10*3/uL (ref 150–400)
RBC: 4.45 MIL/uL (ref 4.22–5.81)
RDW: 14.1 % (ref 11.5–15.5)
WBC: 4.6 10*3/uL (ref 4.0–10.5)

## 2014-02-23 LAB — RAPID URINE DRUG SCREEN, HOSP PERFORMED
Amphetamines: NOT DETECTED
Barbiturates: NOT DETECTED
Benzodiazepines: NOT DETECTED
Cocaine: NOT DETECTED
OPIATES: NOT DETECTED
TETRAHYDROCANNABINOL: NOT DETECTED

## 2014-02-23 LAB — ETHANOL: Alcohol, Ethyl (B): 45 mg/dL — ABNORMAL HIGH (ref 0–9)

## 2014-02-23 MED ORDER — SODIUM CHLORIDE 0.9 % IV BOLUS (SEPSIS)
1000.0000 mL | Freq: Once | INTRAVENOUS | Status: AC
  Administered 2014-02-23: 1000 mL via INTRAVENOUS

## 2014-02-23 MED ORDER — LORAZEPAM 1 MG PO TABS: 1.0000 mg | ORAL_TABLET | Freq: Four times a day (QID) | ORAL | Status: DC | PRN

## 2014-02-23 MED ORDER — ADULT MULTIVITAMIN W/MINERALS CH: 1.0000 | ORAL_TABLET | Freq: Every day | ORAL | Status: DC

## 2014-02-23 MED ORDER — CHLORDIAZEPOXIDE HCL 25 MG PO CAPS: 25.0000 mg | ORAL_CAPSULE | Freq: Three times a day (TID) | ORAL | Status: DC | PRN

## 2014-02-23 MED ORDER — THIAMINE HCL 100 MG/ML IJ SOLN
100.0000 mg | Freq: Once | INTRAMUSCULAR | Status: AC
  Administered 2014-02-23: 100 mg via INTRAVENOUS
  Filled 2014-02-23: qty 2

## 2014-02-23 MED ORDER — LORAZEPAM 1 MG PO TABS
0.0000 mg | ORAL_TABLET | Freq: Four times a day (QID) | ORAL | Status: DC
  Administered 2014-02-23: 2 mg via ORAL
  Filled 2014-02-23: qty 2

## 2014-02-23 MED ORDER — LORAZEPAM 2 MG/ML IJ SOLN: 1.0000 mg | Freq: Four times a day (QID) | INTRAMUSCULAR | Status: DC | PRN

## 2014-02-23 MED ORDER — LORAZEPAM 1 MG PO TABS: 0.0000 mg | ORAL_TABLET | Freq: Two times a day (BID) | ORAL | Status: DC

## 2014-02-23 MED ORDER — ALBUTEROL SULFATE (2.5 MG/3ML) 0.083% IN NEBU
2.5000 mg | INHALATION_SOLUTION | Freq: Once | RESPIRATORY_TRACT | Status: AC
  Administered 2014-02-23: 2.5 mg via RESPIRATORY_TRACT
  Filled 2014-02-23: qty 3

## 2014-02-23 MED ORDER — IPRATROPIUM-ALBUTEROL 0.5-2.5 (3) MG/3ML IN SOLN
3.0000 mL | Freq: Once | RESPIRATORY_TRACT | Status: AC
  Administered 2014-02-23: 3 mL via RESPIRATORY_TRACT
  Filled 2014-02-23: qty 3

## 2014-02-23 MED ORDER — FOLIC ACID 1 MG PO TABS: 1.0000 mg | ORAL_TABLET | Freq: Every day | ORAL | Status: DC

## 2014-02-23 NOTE — ED Notes (Signed)
Pt states he has been drinking a case of beer everyday for the last 3 weeks and is seeking help to got off the alcohol;

## 2014-02-23 NOTE — ED Provider Notes (Addendum)
CSN: [id number]     Arrival date & time 02/23/14  0418 History   First MD Initiated Contact with Patient 02/23/14 0430     Chief Complaint  Patient presents with  . alcohol addiction      Patient is a 59 y.o. male presenting with alcohol problem. The history is provided by the patient.  Alcohol Problem This is a chronic problem. Episode onset: Pt has a long history of alcohol problems.  He has been drinking a case of beer daily for the last 3 weeks.  Last drink was 2 hours ago.  He woke up feeling like he was withdrawing. Pertinent negatives include no chest pain, no abdominal pain and no headaches. Nothing aggravates the symptoms. Nothing relieves the symptoms.  He has had trouble with DTs in the past.  He gets shaky and short of breath when he stops drinking.  Past Medical History  Diagnosis Date  . COPD (chronic obstructive pulmonary disease)   . High cholesterol   . Alcohol addiction   . Depression   . Bipolar 1 disorder   . PTSD (post-traumatic stress disorder)   . Anxiety   . Panic    Past Surgical History  Procedure Laterality Date  . Tonsillectomy     Family History  Problem Relation Age of Onset  . Coronary artery disease Mother   . Coronary artery disease Father    History  Substance Use Topics  . Smoking status: Current Every Day Smoker -- 1.00 packs/day    Types: Cigarettes  . Smokeless tobacco: Not on file  . Alcohol Use: Yes     Comment: heavily    Review of Systems  Cardiovascular: Negative for chest pain.  Gastrointestinal: Negative for abdominal pain.  Neurological: Negative for headaches.  All other systems reviewed and are negative.     Allergies  Review of patient's allergies indicates no known allergies.  Home Medications   Prior to Admission medications   Medication Sig Start Date End Date Taking? Authorizing Provider  albuterol (PROAIR HFA) 108 (90 BASE) MCG/ACT inhaler Inhale 2 puffs into the lungs every 6 (six) hours as needed for  wheezing or shortness of breath.    Historical Provider, MD  ANORO ELLIPTA 62.5-25 MCG/INH AEPB Inhale 1 puff into the lungs every evening.  04/15/13   Historical Provider, MD  diphenhydrAMINE (BENADRYL) 25 mg capsule Take 50 mg by mouth at bedtime.    Historical Provider, MD  ibuprofen (ADVIL,MOTRIN) 200 MG tablet Take 400 mg by mouth daily as needed for headache.    Historical Provider, MD  ipratropium-albuterol (DUONEB) 0.5-2.5 (3) MG/3ML SOLN Take 3 mLs by nebulization every 6 (six) hours as needed (shortness of breath).     Historical Provider, MD  LORazepam (ATIVAN) 1 MG tablet Take 1 tablet (1 mg total) by mouth 3 (three) times daily as needed for anxiety. 08/26/13   Tammy L. Triplett, PA-C  Multiple Vitamin (MULTIVITAMIN WITH MINERALS) TABS tablet Take 1 tablet by mouth daily.    Historical Provider, MD  Naphazoline HCl (CLEAR EYES OP) Place 2 drops into both eyes daily as needed (red eyes).    Historical Provider, MD  oxyCODONE-acetaminophen (PERCOCET/ROXICET) 5-325 MG per tablet Take 1 tablet by mouth every 4 (four) hours as needed. 12/15/13   Tammy L. Triplett, PA-C  theophylline (THEO-24) 300 MG 24 hr capsule Take 300 mg by mouth daily.    Historical Provider, MD   BP 134/86 mmHg  Pulse 107  Temp(Src) 97.6 F (36.4 C) (  Oral)  Resp 24  Ht 5\' 8"  (1.727 m)  Wt 195 lb (88.451 kg)  BMI 29.66 kg/m2  SpO2 97% Physical Exam  Constitutional: He appears well-developed and well-nourished. No distress.  HENT:  Head: Normocephalic and atraumatic.  Right Ear: External ear normal.  Left Ear: External ear normal.  Eyes: Conjunctivae are normal. Right eye exhibits no discharge. Left eye exhibits no discharge. No scleral icterus.  Neck: Neck supple. No tracheal deviation present.  Cardiovascular: Regular rhythm and intact distal pulses.  Tachycardia present.   Pulmonary/Chest: Effort normal and breath sounds normal. No stridor. No respiratory distress. He has no wheezes. He has no rales.   Abdominal: Soft. Bowel sounds are normal. He exhibits no distension. There is no tenderness. There is no rebound and no guarding.  Musculoskeletal: He exhibits no edema or tenderness.  Neurological: He is alert. He has normal strength. No cranial nerve deficit (no facial droop, extraocular movements intact, no slurred speech) or sensory deficit. He exhibits normal muscle tone. He displays no seizure activity. Coordination normal.  Skin: Skin is warm and dry. No rash noted.  Psychiatric: He has a normal mood and affect.  Nursing note and vitals reviewed.   ED Course  Procedures (including critical care time) Labs Review Labs Reviewed  COMPREHENSIVE METABOLIC PANEL - Abnormal; Notable for the following:    Sodium 134 (*)    Glucose, Bld 105 (*)    All other components within normal limits  ETHANOL - Abnormal; Notable for the following:    Alcohol, Ethyl (B) 45 (*)    All other components within normal limits  CBC WITH DIFFERENTIAL/PLATELET  URINE RAPID DRUG SCREEN (HOSP PERFORMED)   Pt requested a breathing treatment.  He is on chronic inhlaers  MDM   Final diagnoses:  Alcohol abuse   Pt is medically stable.  Mild tachycardia.  Does not appear tremulous.  Will start CIWA protocol, benzodiazepines.  Consult with TTS to assist with treatment.    Dorie Rank, MD 02/23/14 0615  Pt assessed by TTS.  Pt would prefer outpatient treatment.  Outpatient resources provided by TTS.  Will dc home with librium to help with symptoms.  Dorie Rank, MD 02/23/14 [phone]

## 2014-02-23 NOTE — BH Assessment (Signed)
Tele Assessment Note   Robert [name] is a 59 y.o. male who voluntarily presents to [facility] for alcohol detox. Pt denies SI/HI/AVH. Pt says he drinks 12pk-1case of beer, daily his last use was 02/23/14. He drank 2-12oz beers.  Pt says he is stressed about family problems at home--"there are people who shouldn't be living there".  Pt denies seizures/blackouts but endorses w/d sxs: tremors, muscle spasms, slurred speech and hot flashes.  Pt is requesting outpatient referrals, he does not want inpt services. This Probation officer talked with dr Tomi Bamberger who agreed with disposition; referrals faxed.    Axis I: Alcohol Use D/O  Axis II: Deferred Axis III:  Past Medical History  Diagnosis Date  . COPD (chronic obstructive pulmonary disease)   . High cholesterol   . Alcohol addiction   . Depression   . Bipolar 1 disorder   . PTSD (post-traumatic stress disorder)   . Anxiety   . Panic    Axis IV: other psychosocial or environmental problems, problems related to social environment and problems with primary support group Axis V: 41-50 serious symptoms  Past Medical History:  Past Medical History  Diagnosis Date  . COPD (chronic obstructive pulmonary disease)   . High cholesterol   . Alcohol addiction   . Depression   . Bipolar 1 disorder   . PTSD (post-traumatic stress disorder)   . Anxiety   . Panic     Past Surgical History  Procedure Laterality Date  . Tonsillectomy      Family History:  Family History  Problem Relation Age of Onset  . Coronary artery disease Mother   . Coronary artery disease Father     Social History:  reports that he has been smoking Cigarettes.  He has been smoking about 1.00 pack per day. He does not have any smokeless tobacco history on file. He reports that he drinks alcohol. He reports that he does not use illicit drugs.  Additional Social History:  Alcohol / Drug Use Pain Medications: See MAR  Prescriptions: See MAR  Over the Counter: See MAR  History of  alcohol / drug use?: Yes Longest period of sobriety (when/how long): When in detox  Negative Consequences of Use: Work / Youth worker, Charity fundraiser relationships, Museum/gallery curator Withdrawal Symptoms: Sweats, Tremors, Other (Comment) (Muscle spasms, Hot flashes, slurred speech ) Substance #1 Name of Substance 1: Alcohol  1 - Age of First Use: 12 YOM  1 - Amount (size/oz): 12pk-1 case  1 - Frequency: Daily  1 - Duration: On-going  1 - Last Use / Amount: 02/23/14  CIWA: CIWA-Ar BP: 134/86 mmHg Pulse Rate: 107 Nausea and Vomiting: no nausea and no vomiting Tactile Disturbances: mild itching, pins and needles, burning or numbness Tremor: not visible, but can be felt fingertip to fingertip Auditory Disturbances: not present Paroxysmal Sweats: no sweat visible Visual Disturbances: not present Anxiety: moderately anxious, or guarded, so anxiety is inferred Headache, Fullness in Head: moderate Agitation: moderately fidgety and restless Orientation and Clouding of Sensorium: oriented and can do serial additions CIWA-Ar Total: 14 COWS:    PATIENT STRENGTHS: (choose at least two) Motivation for treatment/growth  Allergies: No Known Allergies  Home Medications:  (Not in a hospital admission)  OB/GYN Status:  No LMP for male patient.  General Assessment Data Location of Assessment: [facility] Is this a Tele or Face-to-Face Assessment?: Tele Assessment Is this an Initial Assessment or a Re-assessment for this encounter?: Initial Assessment Living Arrangements: Spouse/significant other, Children Can pt return to current living  arrangement?: Yes Admission Status: Voluntary Is patient capable of signing voluntary admission?: Yes Transfer from: Home Referral Source: Self/Family/Friend  Medical Screening Exam ([place]) Medical Exam completed: No Reason for MSE not completed: Other: (None )  [place] Living Arrangements: Spouse/significant other, Children Name of Psychiatrist: None   Name of Therapist: None   Education Status Is patient currently in school?: No Current Grade: None  Highest grade of school patient has completed: None  Name of school: None  Contact person: None   Risk to self with the past 6 months Suicidal Ideation: No Suicidal Intent: No Is patient at risk for suicide?: No Suicidal Plan?: No Access to Means: No What has been your use of drugs/alcohol within the last 12 months?: Abusing: alcohol  Previous Attempts/Gestures: No How many times?: 0 Other Self Harm Risks: None  Triggers for Past Attempts: None known Intentional Self Injurious Behavior: None Family Suicide History: No Recent stressful life event(s): Other (Comment) (Chronic SA ) Persecutory voices/beliefs?: No Depression: Yes Depression Symptoms: Loss of interest in usual pleasures Substance abuse history and/or treatment for substance abuse?: Yes Suicide prevention information given to non-admitted patients: Not applicable  Risk to Others within the past 6 months Homicidal Ideation: No Thoughts of Harm to Others: No Current Homicidal Intent: No Current Homicidal Plan: No Access to Homicidal Means: No Identified Victim: None  History of harm to others?: No Assessment of Violence: None Noted Violent Behavior Description: None  Does patient have access to weapons?: No Criminal Charges Pending?: No Does patient have a court date: No  Psychosis Hallucinations: None noted Delusions: None noted  Mental Status Report Appear/Hygiene: In hospital gown Eye Contact: Fair Motor Activity: Unremarkable Speech: Logical/coherent Level of Consciousness: Alert Mood: Depressed Affect: Appropriate to circumstance, Depressed Anxiety Level: None Thought Processes: Coherent, Relevant Judgement: Unimpaired Orientation: Person, Place, Time, Situation Obsessive Compulsive Thoughts/Behaviors: None  Cognitive Functioning Concentration: Normal Memory: Recent Intact, Remote  Intact IQ: Average Insight: Fair Impulse Control: Good Appetite: Fair Weight Loss: 0 Weight Gain: 0 Sleep: Decreased Total Hours of Sleep: 5 Vegetative Symptoms: None  ADLScreening [facility] Assessment Services) Patient's cognitive ability adequate to safely complete daily activities?: Yes Patient able to express need for assistance with ADLs?: Yes Independently performs ADLs?: Yes (appropriate for developmental age)  Prior Inpatient Therapy Prior Inpatient Therapy: Yes Prior Therapy Dates: Unk  Prior Therapy Facilty/Provider(s): Personal assistant, [place]  Reason for Treatment: Detox   Prior Outpatient Therapy Prior Outpatient Therapy: No Prior Therapy Dates: None  Prior Therapy Facilty/Provider(s): None  Reason for Treatment: None   ADL Screening (condition at time of admission) Patient's cognitive ability adequate to safely complete daily activities?: Yes Is the patient deaf or have difficulty hearing?: No Does the patient have difficulty seeing, even when wearing glasses/contacts?: No Does the patient have difficulty concentrating, remembering, or making decisions?: No Patient able to express need for assistance with ADLs?: Yes Does the patient have difficulty dressing or bathing?: No Independently performs ADLs?: Yes (appropriate for developmental age) Does the patient have difficulty walking or climbing stairs?: No Weakness of Legs: None Weakness of Arms/Hands: None  Home Assistive Devices/Equipment Home Assistive Devices/Equipment: None  Therapy Consults (therapy consults require a physician order) PT Evaluation Needed: No OT Evalulation Needed: No SLP Evaluation Needed: No Abuse/Neglect Assessment (Assessment to be complete while patient is alone) Physical Abuse: Denies Verbal Abuse: Denies Sexual Abuse: Denies Exploitation of patient/patient's resources: Denies Self-Neglect: Denies Values / Beliefs Cultural Requests During Hospitalization: None Spiritual  Requests During Hospitalization: None Consults Spiritual Care Consult Needed: No Social Work Consult Needed: No Regulatory affairs officer (For Healthcare) Does patient have an advance directive?: No Would patient like information on creating an advanced directive?: No - patient declined information    Additional Information 1:1 In Past 12 Months?: No CIRT Risk: No Elopement Risk: No Does patient have medical clearance?: Yes     Disposition:  Disposition Initial Assessment Completed for this Encounter: Yes Disposition of Patient: Referred to (Pt to be d/c with outpatient referrals per Dr. Tomi Bamberger ) Patient referred to: Other (Comment) (Pt to be d/c with outpatient referrals per Dr. Tomi Bamberger )  Girtha Rm 02/23/2014 6:53 AM

## 2014-02-23 NOTE — ED Notes (Signed)
Pt's telepsych is finished; pt requesting sprite and a breathing treatment; Dr. Tomi Bamberger informed and breathing treatment ordered; pt states he doesn't feel as anxious after having the ativan

## 2014-02-23 NOTE — Discharge Instructions (Signed)
Alcohol Withdrawal Anytime drug use is interfering with normal living activities it has become abuse. This includes problems with family and friends. Psychological dependence has developed when your mind tells you that the drug is needed. This is usually followed by physical dependence when a continuing increase of drugs are required to get the same feeling or "high." This is known as addiction or chemical dependency. A person's risk is much higher if there is a history of chemical dependency in the family. Mild Withdrawal Following Stopping Alcohol, When Addiction or Chemical Dependency Has Developed When a person has developed tolerance to alcohol, any sudden stopping of alcohol can cause uncomfortable physical symptoms. Most of the time these are mild and consist of tremors in the hands and increases in heart rate, breathing, and temperature. Sometimes these symptoms are associated with anxiety, panic attacks, and bad dreams. There may also be stomach upset. Normal sleep patterns are often interrupted with periods of inability to sleep (insomnia). This may last for 6 months. Because of this discomfort, many people choose to continue drinking to get rid of this discomfort and to try to feel normal. Severe Withdrawal with Decreased or No Alcohol Intake, When Addiction or Chemical Dependency Has Developed About five percent of alcoholics will develop signs of severe withdrawal when they stop using alcohol. One sign of this is development of generalized seizures (convulsions). Other signs of this are severe agitation and confusion. This may be associated with believing in things which are not real or seeing things which are not really there (delusions and hallucinations). Vitamin deficiencies are usually present if alcohol intake has been long-term. Treatment for this most often requires hospitalization and close observation. Addiction can only be helped by stopping use of all chemicals. This is hard but may  save your life. With continual alcohol use, possible outcomes are usually loss of self respect and esteem, violence, and death. Addiction cannot be cured but it can be stopped. This often requires outside help and the care of professionals. Treatment centers are listed in the yellow pages under Cocaine, Narcotics, and Alcoholics Anonymous. Most hospitals and clinics can refer you to a specialized care center. It is not necessary for you to go through the uncomfortable symptoms of withdrawal. Your caregiver can provide you with medicines that will help you through this difficult period. Try to avoid situations, friends, or drugs that made it possible for you to keep using alcohol in the past. Learn how to say no. It takes a long period of time to overcome addictions to all drugs, including alcohol. There may be many times when you feel as though you want a drink. After getting rid of the physical addiction and withdrawal, you will have a lessening of the craving which tells you that you need alcohol to feel normal. Call your caregiver if more support is needed. Learn who to talk to in your family and among your friends so that during these periods you can receive outside help. Alcoholics Anonymous (AA) has helped many people over the years. To get further help, contact AA or call your caregiver, counselor, or clergyperson. Al-Anon and Alateen are support groups for friends and family members of an alcoholic. The people who love and care for an alcoholic often need help, too. For information about these organizations, check your phone directory or call a local alcoholism treatment center.  SEEK IMMEDIATE MEDICAL CARE IF:   You have a seizure.  You have a fever.  You experience uncontrolled vomiting or you  vomit up blood. This may be bright red or look like black coffee grounds.  You have blood in the stool. This may be bright red or appear as a black, tarry, bad-smelling stool.  You become lightheaded or  faint. Do not drive if you feel this way. Have someone else drive you or call 102 for help.  You become more agitated or confused.  You develop uncontrolled anxiety.  You begin to see things that are not really there (hallucinate). Your caregiver has determined that you completely understand your medical condition, and that your mental state is back to normal. You understand that you have been treated for alcohol withdrawal, have agreed not to drink any alcohol for a minimum of 1 day, will not operate a car or other machinery for 24 hours, and have had an opportunity to ask any questions about your condition. Document Released: 10/04/2004 Document Revised: 03/19/2011 Document Reviewed: 08/13/2007 [facility] Patient Information 2015 [place], [place]. This information is not intended to replace advice given to you by your health care provider. Make sure you discuss any questions you have with your health care provider.  Alcohol Use Disorder Alcohol use disorder is a mental disorder. It is not a one-time incident of heavy drinking. Alcohol use disorder is the excessive and uncontrollable use of alcohol over time that leads to problems with functioning in one or more areas of daily living. People with this disorder risk harming themselves and others when they drink to excess. Alcohol use disorder also can cause other mental disorders, such as mood and anxiety disorders, and serious physical problems. People with alcohol use disorder often misuse other drugs.  Alcohol use disorder is common and widespread. Some people with this disorder drink alcohol to cope with or escape from negative life events. Others drink to relieve chronic pain or symptoms of mental illness. People with a family history of alcohol use disorder are at higher risk of losing control and using alcohol to excess.  SYMPTOMS  Signs and symptoms of alcohol use disorder may include the following:   Consumption ofalcohol inlarger amounts or  over a longer period of time than intended.  Multiple unsuccessful attempts to cutdown or control alcohol use.   A great deal of time spent obtaining alcohol, using alcohol, or recovering from the effects of alcohol (hangover).  A strong desire or urge to use alcohol (cravings).   Continued use of alcohol despite problems at work, school, or home because of alcohol use.   Continued use of alcohol despite problems in relationships because of alcohol use.  Continued use of alcohol in situations when it is physically hazardous, such as driving a car.  Continued use of alcohol despite awareness of a physical or psychological problem that is likely related to alcohol use. Physical problems related to alcohol use can involve the brain, heart, liver, stomach, and intestines. Psychological problems related to alcohol use include intoxication, depression, anxiety, psychosis, delirium, and dementia.   The need for increased amounts of alcohol to achieve the same desired effect, or a decreased effect from the consumption of the same amount of alcohol (tolerance).  Withdrawal symptoms upon reducing or stopping alcohol use, or alcohol use to reduce or avoid withdrawal symptoms. Withdrawal symptoms include:  Racing heart.  Hand tremor.  Difficulty sleeping.  Nausea.  Vomiting.  Hallucinations.  Restlessness.  Seizures. DIAGNOSIS Alcohol use disorder is diagnosed through an assessment by your health care provider. Your health care provider may start by asking three or four questions  to screen for excessive or problematic alcohol use. To confirm a diagnosis of alcohol use disorder, at least two symptoms must be present within a 14-month period. The severity of alcohol use disorder depends on the number of symptoms:  Mild--two or three.  Moderate--four or five.  Severe--six or more. Your health care provider may perform a physical exam or use results from lab tests to see if you have  physical problems resulting from alcohol use. Your health care provider may refer you to a mental health professional for evaluation. TREATMENT  Some people with alcohol use disorder are able to reduce their alcohol use to low-risk levels. Some people with alcohol use disorder need to quit drinking alcohol. When necessary, mental health professionals with specialized training in substance use treatment can help. Your health care provider can help you decide how severe your alcohol use disorder is and what type of treatment you need. The following forms of treatment are available:   Detoxification. Detoxification involves the use of prescription medicines to prevent alcohol withdrawal symptoms in the first week after quitting. This is important for people with a history of symptoms of withdrawal and for heavy drinkers who are likely to have withdrawal symptoms. Alcohol withdrawal can be dangerous and, in severe cases, cause death. Detoxification is usually provided in a hospital or in-patient substance use treatment facility.  Counseling or talk therapy. Talk therapy is provided by substance use treatment counselors. It addresses the reasons people use alcohol and ways to keep them from drinking again. The goals of talk therapy are to help people with alcohol use disorder find healthy activities and ways to cope with life stress, to identify and avoid triggers for alcohol use, and to handle cravings, which can cause relapse.  Medicines.Different medicines can help treat alcohol use disorder through the following actions:  Decrease alcohol cravings.  Decrease the positive reward response felt from alcohol use.  Produce an uncomfortable physical reaction when alcohol is used (aversion therapy).  Support groups. Support groups are run by people who have quit drinking. They provide emotional support, advice, and guidance. These forms of treatment are often combined. Some people with alcohol use disorder  benefit from intensive combination treatment provided by specialized substance use treatment centers. Both inpatient and outpatient treatment programs are available. Document Released: 02/02/2004 Document Revised: 05/11/2013 Document Reviewed: 04/03/2012 [facility] Patient Information 2015 [place], [place]. This information is not intended to replace advice given to you by your health care provider. Make sure you discuss any questions you have with your health care provider.

## 2014-02-24 ENCOUNTER — Emergency Department (HOSPITAL_COMMUNITY): Payer: Medicare Other

## 2014-02-24 ENCOUNTER — Emergency Department (HOSPITAL_COMMUNITY)
Admission: EM | Admit: 2014-02-24 | Discharge: 2014-02-25 | Disposition: A | Discharge status: Other Acute Inpatient Hospital | Payer: Medicare Other | Attending: Emergency Medicine | Admitting: Emergency Medicine

## 2014-02-24 ENCOUNTER — Encounter (HOSPITAL_COMMUNITY): Payer: Self-pay | Admitting: Emergency Medicine

## 2014-02-24 DIAGNOSIS — Y9389 Activity, other specified: Secondary | ICD-10-CM | POA: Diagnosis not present

## 2014-02-24 DIAGNOSIS — J441 Chronic obstructive pulmonary disease with (acute) exacerbation: Secondary | ICD-10-CM | POA: Insufficient documentation

## 2014-02-24 DIAGNOSIS — Z7289 Other problems related to lifestyle: Secondary | ICD-10-CM

## 2014-02-24 DIAGNOSIS — Z8639 Personal history of other endocrine, nutritional and metabolic disease: Secondary | ICD-10-CM | POA: Insufficient documentation

## 2014-02-24 DIAGNOSIS — Z79899 Other long term (current) drug therapy: Secondary | ICD-10-CM | POA: Diagnosis not present

## 2014-02-24 DIAGNOSIS — F1010 Alcohol abuse, uncomplicated: Secondary | ICD-10-CM | POA: Diagnosis not present

## 2014-02-24 DIAGNOSIS — J449 Chronic obstructive pulmonary disease, unspecified: Secondary | ICD-10-CM

## 2014-02-24 DIAGNOSIS — Y998 Other external cause status: Secondary | ICD-10-CM | POA: Insufficient documentation

## 2014-02-24 DIAGNOSIS — Z720 Tobacco use: Secondary | ICD-10-CM | POA: Insufficient documentation

## 2014-02-24 DIAGNOSIS — S61512A Laceration without foreign body of left wrist, initial encounter: Secondary | ICD-10-CM | POA: Insufficient documentation

## 2014-02-24 DIAGNOSIS — Z008 Encounter for other general examination: Secondary | ICD-10-CM | POA: Diagnosis present

## 2014-02-24 DIAGNOSIS — F410 Panic disorder [episodic paroxysmal anxiety] without agoraphobia: Secondary | ICD-10-CM | POA: Diagnosis not present

## 2014-02-24 DIAGNOSIS — X788XXA Intentional self-harm by other sharp object, initial encounter: Secondary | ICD-10-CM | POA: Diagnosis not present

## 2014-02-24 DIAGNOSIS — Y9289 Other specified places as the place of occurrence of the external cause: Secondary | ICD-10-CM | POA: Insufficient documentation

## 2014-02-24 DIAGNOSIS — R45851 Suicidal ideations: Secondary | ICD-10-CM

## 2014-02-24 LAB — CBC WITH DIFFERENTIAL/PLATELET
Basophils Absolute: 0 10*3/uL (ref 0.0–0.1)
Basophils Relative: 1 % (ref 0–1)
EOS PCT: 3 % (ref 0–5)
Eosinophils Absolute: 0.2 10*3/uL (ref 0.0–0.7)
HCT: 40.5 % (ref 39.0–52.0)
HEMOGLOBIN: 13.2 g/dL (ref 13.0–17.0)
LYMPHS PCT: 35 % (ref 12–46)
Lymphs Abs: 2.3 10*3/uL (ref 0.7–4.0)
MCH: 30.8 pg (ref 26.0–34.0)
MCHC: 32.6 g/dL (ref 30.0–36.0)
MCV: 94.4 fL (ref 78.0–100.0)
MONOS PCT: 11 % (ref 3–12)
Monocytes Absolute: 0.7 10*3/uL (ref 0.1–1.0)
NEUTROS ABS: 3.3 10*3/uL (ref 1.7–7.7)
Neutrophils Relative %: 50 % (ref 43–77)
Platelets: 255 10*3/uL (ref 150–400)
RBC: 4.29 MIL/uL (ref 4.22–5.81)
RDW: 14.7 % (ref 11.5–15.5)
WBC: 6.5 10*3/uL (ref 4.0–10.5)

## 2014-02-24 LAB — COMPREHENSIVE METABOLIC PANEL
ALBUMIN: 4.3 g/dL (ref 3.5–5.2)
ALT: 23 U/L (ref 0–53)
AST: 26 U/L (ref 0–37)
Alkaline Phosphatase: 51 U/L (ref 39–117)
Anion gap: 9 (ref 5–15)
BUN: 10 mg/dL (ref 6–23)
CO2: 27 mmol/L (ref 19–32)
Calcium: 8.8 mg/dL (ref 8.4–10.5)
Chloride: 101 mmol/L (ref 96–112)
Creatinine, Ser: 0.76 mg/dL (ref 0.50–1.35)
GFR calc Af Amer: >90 mL/min (ref >90)
GFR calc non Af Amer: >90 mL/min (ref >90)
Glucose, Bld: 90 mg/dL (ref 70–99)
Potassium: 3.7 mmol/L (ref 3.5–5.1)
Sodium: 137 mmol/L (ref 135–145)
Total Bilirubin: 0.7 mg/dL (ref 0.3–1.2)
Total Protein: 7.3 g/dL (ref 6.0–8.3)

## 2014-02-24 LAB — ETHANOL: ALCOHOL ETHYL (B): 173 mg/dL — AB (ref 0–9)

## 2014-02-24 MED ORDER — UMECLIDINIUM-VILANTEROL 62.5-25 MCG/INH IN AEPB: INHALATION_SPRAY | Freq: Every morning | RESPIRATORY_TRACT | Status: DC

## 2014-02-24 MED ORDER — ONDANSETRON HCL 4 MG PO TABS: 4.0000 mg | ORAL_TABLET | Freq: Three times a day (TID) | ORAL | Status: DC | PRN

## 2014-02-24 MED ORDER — ACETAMINOPHEN 325 MG PO TABS
650.0000 mg | ORAL_TABLET | ORAL | Status: DC | PRN
Start: 2014-02-24 — End: 2014-02-25

## 2014-02-24 MED ORDER — IPRATROPIUM-ALBUTEROL 0.5-2.5 (3) MG/3ML IN SOLN
3.0000 mL | Freq: Four times a day (QID) | RESPIRATORY_TRACT | Status: DC
  Administered 2014-02-25 (×2): 3 mL via RESPIRATORY_TRACT
  Filled 2014-02-24 (×2): qty 3

## 2014-02-24 MED ORDER — CHLORDIAZEPOXIDE HCL 25 MG PO CAPS: 25.0000 mg | ORAL_CAPSULE | Freq: Three times a day (TID) | ORAL | Status: DC | PRN

## 2014-02-24 MED ORDER — ALBUTEROL SULFATE HFA 108 (90 BASE) MCG/ACT IN AERS: 2.0000 | INHALATION_SPRAY | Freq: Four times a day (QID) | RESPIRATORY_TRACT | Status: DC

## 2014-02-24 MED ORDER — IBUPROFEN 400 MG PO TABS: 600.0000 mg | ORAL_TABLET | Freq: Three times a day (TID) | ORAL | Status: DC | PRN

## 2014-02-24 MED ORDER — IPRATROPIUM-ALBUTEROL 0.5-2.5 (3) MG/3ML IN SOLN
3.0000 mL | Freq: Once | RESPIRATORY_TRACT | Status: AC
  Administered 2014-02-24: 3 mL via RESPIRATORY_TRACT
  Filled 2014-02-24: qty 3

## 2014-02-24 NOTE — BH Assessment (Signed)
Tele Assessment Note   Robert [name] is a 59 y.o. male presenting to [facility] with SI and depression.  Pt returning to [employer], stating--"I'm not doing very well".  Pt reports stressors: Living arrangements and separated from wife.  Pt cut his left wrist with a dull knife.  Pt has visible cuts on left wrist and also drinking 8-12oz beers today and is slightly inebriated with slurred speech.  Pt is SI and continues to endorses depressive sxs: anhedonia, insomnia, loss of interest in daily activities.    Axis I: Depressive Disorder NOS and Alcohol Use D/O, severe  Axis II: Deferred Axis III:  Past Medical History  Diagnosis Date  . COPD (chronic obstructive pulmonary disease)   . High cholesterol   . Alcohol addiction   . Depression   . Bipolar 1 disorder   . PTSD (post-traumatic stress disorder)   . Anxiety   . Panic    Axis IV: other psychosocial or environmental problems, problems related to social environment and problems with primary support group Axis V: 31-40 impairment in reality testing  Past Medical History:  Past Medical History  Diagnosis Date  . COPD (chronic obstructive pulmonary disease)   . High cholesterol   . Alcohol addiction   . Depression   . Bipolar 1 disorder   . PTSD (post-traumatic stress disorder)   . Anxiety   . Panic     Past Surgical History  Procedure Laterality Date  . Tonsillectomy      Family History:  Family History  Problem Relation Age of Onset  . Coronary artery disease Mother   . Coronary artery disease Father     Social History:  reports that he has been smoking Cigarettes.  He has been smoking about 1.00 pack per day. He does not have any smokeless tobacco history on file. He reports that he drinks alcohol. He reports that he does not use illicit drugs.  Additional Social History:  Alcohol / Drug Use Pain Medications: See MAR  Prescriptions: See MAR  Over the Counter: See MAR  History of alcohol / drug use?: Yes Longest  period of sobriety (when/how long): When in detox  Negative Consequences of Use: Work / Youth worker, Charity fundraiser relationships, Museum/gallery curator Withdrawal Symptoms: Other (Comment) (No current w/d sxs ) Substance #1 Name of Substance 1: Alcohol  1 - Age of First Use: 12YOM  1 - Amount (size/oz): 12pk-1case  1 - Frequency: Daily  1 - Duration: On-going  1 - Last Use / Amount: 02/24/14  CIWA: CIWA-Ar BP: 118/74 mmHg Pulse Rate: 88 COWS:    PATIENT STRENGTHS: (choose at least two) Communication skills Motivation for treatment/growth  Allergies: No Known Allergies  Home Medications:  (Not in a hospital admission)  OB/GYN Status:  No LMP for male patient.  General Assessment Data Location of Assessment: [facility] Is this a Tele or Face-to-Face Assessment?: Tele Assessment Is this an Initial Assessment or a Re-assessment for this encounter?: Initial Assessment Living Arrangements: Spouse/significant other, Children Can pt return to current living arrangement?: Yes Admission Status: Voluntary Is patient capable of signing voluntary admission?: Yes Transfer from: Home Referral Source: Self/Family/Friend  Medical Screening Exam ([place]) Medical Exam completed: No Reason for MSE not completed: Other: (None )  [place] Living Arrangements: Spouse/significant other, Children Name of Psychiatrist: None  Name of Therapist: None   Education Status Is patient currently in school?: No Current Grade: None  Highest grade of school patient has completed: None  Name of school: None  Contact person: None   Risk to self with the past 6 months Suicidal Ideation: Yes-Currently Present Suicidal Intent: Yes-Currently Present Is patient at risk for suicide?: Yes Suicidal Plan?: Yes-Currently Present Specify Current Suicidal Plan: Cut wrists  Access to Means: Yes Specify Access to Suicidal Means: Sharps, Knives  What has been your use of drugs/alcohol within the last 12 months?:  Abusing: alcohol  Previous Attempts/Gestures: No How many times?: 0 Other Self Harm Risks: None  Triggers for Past Attempts: None known Intentional Self Injurious Behavior: None Family Suicide History: No Recent stressful life event(s): Loss (Comment), Conflict (Comment), Other (Comment) (Chronic SA; Separtion from wife; issues w/living arrangments) Persecutory voices/beliefs?: No Depression: Yes Depression Symptoms: Loss of interest in usual pleasures, Feeling worthless/self pity, Fatigue Substance abuse history and/or treatment for substance abuse?: Yes Suicide prevention information given to non-admitted patients: Not applicable  Risk to Others within the past 6 months Homicidal Ideation: No Thoughts of Harm to Others: No Current Homicidal Intent: No Current Homicidal Plan: No Access to Homicidal Means: No Identified Victim: None  History of harm to others?: No Assessment of Violence: None Noted Violent Behavior Description: None  Does patient have access to weapons?: No Criminal Charges Pending?: No Does patient have a court date: No  Psychosis Hallucinations: None noted Delusions: None noted  Mental Status Report Appear/Hygiene: In scrubs Eye Contact: Fair Motor Activity: Unremarkable Speech: Logical/coherent, Slurred Level of Consciousness: Alert Mood: Depressed, Sad Affect: Appropriate to circumstance, Depressed, Sad Anxiety Level: None Thought Processes: Coherent, Relevant Judgement: Impaired Orientation: Person, Place, Time, Situation Obsessive Compulsive Thoughts/Behaviors: None  Cognitive Functioning Concentration: Normal Memory: Recent Intact, Remote Intact IQ: Average Insight: Poor Impulse Control: Poor Appetite: Fair Weight Loss: 0 Weight Gain: 0 Sleep: No Change Total Hours of Sleep: 5 Vegetative Symptoms: None  ADLScreening [facility]) Patient's cognitive ability adequate to safely complete daily activities?: Yes Patient able  to express need for assistance with ADLs?: Yes Independently performs ADLs?: Yes (appropriate for developmental age)  Prior Inpatient Therapy Prior Inpatient Therapy: Yes Prior Therapy Dates: Unk  Prior Therapy Facilty/Provider(s): Personal assistant, [facility]  Reason for Treatment: Detox   Prior Outpatient Therapy Prior Outpatient Therapy: No Prior Therapy Dates: None  Prior Therapy Facilty/Provider(s): None  Reason for Treatment: None   ADL Screening (condition at time of admission) Patient's cognitive ability adequate to safely complete daily activities?: Yes Is the patient deaf or have difficulty hearing?: No Does the patient have difficulty seeing, even when wearing glasses/contacts?: No Does the patient have difficulty concentrating, remembering, or making decisions?: No Patient able to express need for assistance with ADLs?: Yes Does the patient have difficulty dressing or bathing?: No Independently performs ADLs?: Yes (appropriate for developmental age) Does the patient have difficulty walking or climbing stairs?: No Weakness of Legs: None Weakness of Arms/Hands: None  Home Assistive Devices/Equipment Home Assistive Devices/Equipment: None  Therapy Consults (therapy consults require a physician order) PT Evaluation Needed: No OT Evalulation Needed: No SLP Evaluation Needed: No Abuse/Neglect Assessment (Assessment to be complete while patient is alone) Physical Abuse: Denies Verbal Abuse: Denies Sexual Abuse: Denies Exploitation of patient/patient's resources: Denies Self-Neglect: Denies Values / Beliefs Cultural Requests During Hospitalization: None Spiritual Requests During Hospitalization: None Consults Spiritual Care Consult Needed: No Social Work Consult Needed: No Regulatory affairs officer (For Healthcare) Does patient have an advance directive?: No Would patient like information on creating an advanced directive?: No - patient declined information     Additional Information  1:1 In Past 12 Months?: No CIRT Risk: No Elopement Risk: No Does patient have medical clearance?: Yes     Disposition:  Disposition Initial Assessment Completed for this Encounter: Yes Disposition of Patient: Inpatient treatment program, Referred to (per Patriciaann Clan, PA recommend ) Type of inpatient treatment program: Adult Patient referred to: Other (Comment) (Per Patriciaann Clan, PA recommend )  Girtha Rm 02/24/2014 10:31 PM
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
## 2014-02-24 NOTE — ED Notes (Signed)
Pt states he needs help with his life and has visible marks to the left wrist.

## 2014-02-24 NOTE — ED Provider Notes (Signed)
CSN: [id number]     Arrival date & time 02/24/14  1909 History   First MD Initiated Contact with Patient 02/24/14 1924     Chief Complaint  Patient presents with  . V70.1     (Consider location/radiation/quality/duration/timing/severity/associated sxs/prior Treatment) The history is provided by the patient.   59 year old male with history of alcohol abuse. History of depression and bipolar and posttraumatic stress disorder. Patient also has a history of COPD uses inhalers regularly. Patient is also on oxygen 2 L at all times and 3 L when needed for exertion.  Patient with family problems felt very depressed want to kill himself superficial cuts to his left wrist. Patient is right-hand dominant. Patient states tetanus is up-to-date. No active bleeding. Patient denies any ingestion of pills. Patient admits that he did drink alcohol today. Patient also feels like he is wheezing.  Past Medical History  Diagnosis Date  . COPD (chronic obstructive pulmonary disease)   . High cholesterol   . Alcohol addiction   . Depression   . Bipolar 1 disorder   . PTSD (post-traumatic stress disorder)   . Anxiety   . Panic    Past Surgical History  Procedure Laterality Date  . Tonsillectomy     Family History  Problem Relation Age of Onset  . Coronary artery disease Mother   . Coronary artery disease Father    History  Substance Use Topics  . Smoking status: Current Every Day Smoker -- 1.00 packs/day    Types: Cigarettes  . Smokeless tobacco: Not on file  . Alcohol Use: Yes     Comment: heavily    Review of Systems  Constitutional: Negative for fever.  HENT: Negative for congestion.   Eyes: Negative for redness.  Respiratory: Positive for shortness of breath and wheezing.   Cardiovascular: Negative for chest pain.  Gastrointestinal: Negative for abdominal pain.  Musculoskeletal: Negative for back pain.  Skin: Positive for wound.  Neurological: Negative for headaches.  Hematological:  Does not bruise/bleed easily.  Psychiatric/Behavioral: Negative for confusion.      Allergies  Review of patient's allergies indicates no known allergies.  Home Medications   Prior to Admission medications   Medication Sig Start Date End Date Taking? Authorizing Provider  albuterol (PROAIR HFA) 108 (90 BASE) MCG/ACT inhaler Inhale 2 puffs into the lungs every 6 (six) hours as needed for wheezing or shortness of breath.    Historical Provider, MD  ANORO ELLIPTA 62.5-25 MCG/INH AEPB Inhale 1 puff into the lungs every evening.  04/15/13   Historical Provider, MD  chlordiazePOXIDE (LIBRIUM) 25 MG capsule Take 1 capsule (25 mg total) by mouth 3 (three) times daily as needed for anxiety. 02/23/14   Dorie Rank, MD  diphenhydrAMINE (BENADRYL) 25 mg capsule Take 50 mg by mouth at bedtime.    Historical Provider, MD  ibuprofen (ADVIL,MOTRIN) 200 MG tablet Take 400 mg by mouth daily as needed for headache.    Historical Provider, MD  ipratropium-albuterol (DUONEB) 0.5-2.5 (3) MG/3ML SOLN Take 3 mLs by nebulization every 6 (six) hours as needed (shortness of breath).     Historical Provider, MD  LORazepam (ATIVAN) 1 MG tablet Take 1 tablet (1 mg total) by mouth 3 (three) times daily as needed for anxiety. 08/26/13   Tammy L. Triplett, PA-C  Multiple Vitamin (MULTIVITAMIN WITH MINERALS) TABS tablet Take 1 tablet by mouth daily.    Historical Provider, MD  Naphazoline HCl (CLEAR EYES OP) Place 2 drops into both eyes daily as needed (red  eyes).    Historical Provider, MD  oxyCODONE-acetaminophen (PERCOCET/ROXICET) 5-325 MG per tablet Take 1 tablet by mouth every 4 (four) hours as needed. 12/15/13   Tammy L. Triplett, PA-C  theophylline (THEO-24) 300 MG 24 hr capsule Take 300 mg by mouth daily.    Historical Provider, MD   BP 118/74 mmHg  Pulse 88  Temp(Src) 97.5 F (36.4 C) (Oral)  Resp 16  Ht 5\' 8"  (1.727 m)  Wt 195 lb (88.451 kg)  BMI 29.66 kg/m2  SpO2 100% Physical Exam  Constitutional: He is  oriented to person, place, and time. He appears well-developed and well-nourished. No distress.  HENT:  Head: Normocephalic and atraumatic.  Mouth/Throat: Oropharynx is clear and moist.  Eyes: Conjunctivae and EOM are normal. Pupils are equal, round, and reactive to light.  Neck: Normal range of motion. Neck supple.  Cardiovascular: Normal rate, regular rhythm and normal heart sounds.   No murmur heard. Pulmonary/Chest: Effort normal. He has wheezes.  Abdominal: Soft. There is no tenderness.  Musculoskeletal: Normal range of motion. He exhibits no edema.  Normal except for left wrist with several vertical superficial lacerations no active bleeding. No evidence of any deep tendon structure injuries or any deep structure injuries. Full range of motion of the hand sensation intact. Cap refill 2 sec radial pulses 2+.  Neurological: He is alert and oriented to person, place, and time. No cranial nerve deficit. He exhibits normal muscle tone. Coordination normal.  Skin: Skin is warm. No rash noted.  Nursing note and vitals reviewed.   ED Course  Procedures (including critical care time) Labs Review Labs Reviewed  ETHANOL - Abnormal; Notable for the following:    Alcohol, Ethyl (B) 173 (*)    All other components within normal limits  COMPREHENSIVE METABOLIC PANEL  CBC WITH DIFFERENTIAL/PLATELET  URINE RAPID DRUG SCREEN (HOSP PERFORMED)    Imaging Review Dg Chest 2 View  02/24/2014   CLINICAL DATA:  Shortness of breath and right chest pain.  Smoker.  EXAM: CHEST  2 VIEW  COMPARISON:  PA and lateral chest 09/16/2011 and 11/14/2013.  FINDINGS: The chest is hyperexpanded with attenuation of the pulmonary vasculature consistent with emphysema. Lungs appear clear. Heart size is normal. No pneumothorax or pleural effusion.  IMPRESSION: Emphysema without acute disease.   Electronically Signed   By: Inge Rise M.D.   On: 02/24/2014 20:30     EKG Interpretation None      MDM   Final  diagnoses:  COPD (chronic obstructive pulmonary disease)  ETOH abuse  Suicidal ideation    Patient was suicidal thoughts and intent self cutting to left wrist. Lacerations are all superficial not life-threatening and no deep structure injuries. Patient has a history of alcohol abuse. Patient's alcohol level is elevated here. Patient has a history of depression and bipolar disorder posterior medics stress disorder. Patient states that his had family problems and wants to kill himself. Patient has a history of COPD emphysema. Wheezing here bilaterally will be treated with his normal meds and a duo neb. Patient in no acute distress. Oxygen saturations on his normal 2 L of oxygen at rest is 100%. Patient uses 3 L of oxygen when active.  Urine drug screen is pending. Patient showing no signs of any alcohol withdrawal at this time. Patient will be continued on his Librium.  Behavior health team will need to assess consult placed. Patient is currently voluntary and has no desire to leave.  Patient's tetanus is up-to-date.  Fredia Sorrow,  MD 02/24/14 2156

## 2014-02-25 NOTE — ED Notes (Signed)
Pt trans to [facility]

## 2014-02-25 NOTE — ED Provider Notes (Signed)
Patient accepted to [facility]. They are requesting he be IVC'd prior to transport.  Ephraim Hamburger, MD 02/25/14 0830

## 2014-02-25 NOTE — ED Notes (Signed)
IVC papers faxed to [facility]

## 2014-02-25 NOTE — ED Notes (Signed)
Spoke with Harriette Bouillon at [facility], pt is accepted, will call [facility] for transport with O2 2L .

## 2014-02-25 NOTE — ED Notes (Signed)
[facility] here to transport pt, O2 tank provided for deputy to transport pt.

## 2014-02-25 NOTE — ED Notes (Signed)
Per Otila Kluver, pt has been accepted to [facility] but he needs to be IVC'd and paper work faxed to [phone].

## 2014-02-25 NOTE — ED Notes (Signed)
Attempted to call report, spoke with Lattie Haw RN, during report the nurse was previously unaware that the pt is on chronic O2.  The nurse said she will call back.

## 2014-02-25 NOTE — BH Assessment (Signed)
Inpt recommended. No current [facility] beds available. TTS seeking placement.  [place], [place] Triage Specialist 02/25/2014 1:58 AM

## 2014-02-25 NOTE — ED Notes (Signed)
[facility] Fax number: [phone].

## 2014-09-26 ENCOUNTER — Encounter (HOSPITAL_COMMUNITY): Payer: Self-pay | Admitting: Emergency Medicine

## 2014-09-26 ENCOUNTER — Emergency Department (HOSPITAL_COMMUNITY)
Admission: EM | Admit: 2014-09-26 | Discharge: 2014-09-26 | Disposition: A | Discharge status: Home / Self Care | Payer: Medicare Other | Attending: Emergency Medicine | Admitting: Emergency Medicine

## 2014-09-26 ENCOUNTER — Emergency Department (HOSPITAL_COMMUNITY): Payer: Medicare Other

## 2014-09-26 DIAGNOSIS — Z79899 Other long term (current) drug therapy: Secondary | ICD-10-CM | POA: Insufficient documentation

## 2014-09-26 DIAGNOSIS — R0602 Shortness of breath: Secondary | ICD-10-CM | POA: Diagnosis present

## 2014-09-26 DIAGNOSIS — Z720 Tobacco use: Secondary | ICD-10-CM | POA: Insufficient documentation

## 2014-09-26 DIAGNOSIS — F410 Panic disorder [episodic paroxysmal anxiety] without agoraphobia: Secondary | ICD-10-CM | POA: Diagnosis not present

## 2014-09-26 DIAGNOSIS — F319 Bipolar disorder, unspecified: Secondary | ICD-10-CM | POA: Insufficient documentation

## 2014-09-26 DIAGNOSIS — Z7951 Long term (current) use of inhaled steroids: Secondary | ICD-10-CM | POA: Insufficient documentation

## 2014-09-26 DIAGNOSIS — J441 Chronic obstructive pulmonary disease with (acute) exacerbation: Secondary | ICD-10-CM | POA: Diagnosis not present

## 2014-09-26 DIAGNOSIS — Z8639 Personal history of other endocrine, nutritional and metabolic disease: Secondary | ICD-10-CM | POA: Diagnosis not present

## 2014-09-26 DIAGNOSIS — Z9981 Dependence on supplemental oxygen: Secondary | ICD-10-CM | POA: Diagnosis not present

## 2014-09-26 LAB — COMPREHENSIVE METABOLIC PANEL
ALT: 16 U/L — AB (ref 17–63)
AST: 19 U/L (ref 15–41)
Albumin: 4.3 g/dL (ref 3.5–5.0)
Alkaline Phosphatase: 54 U/L (ref 38–126)
Anion gap: 7 (ref 5–15)
BUN: 9 mg/dL (ref 6–20)
CO2: 29 mmol/L (ref 22–32)
CREATININE: 0.77 mg/dL (ref 0.61–1.24)
Calcium: 9 mg/dL (ref 8.9–10.3)
Chloride: 102 mmol/L (ref 101–111)
Glucose, Bld: 108 mg/dL — ABNORMAL HIGH (ref 65–99)
Potassium: 3.9 mmol/L (ref 3.5–5.1)
Sodium: 138 mmol/L (ref 135–145)
Total Bilirubin: 1 mg/dL (ref 0.3–1.2)
Total Protein: 7.5 g/dL (ref 6.5–8.1)

## 2014-09-26 LAB — CBC WITH DIFFERENTIAL/PLATELET
Basophils Absolute: 0 10*3/uL (ref 0.0–0.1)
Basophils Relative: 0 %
Eosinophils Absolute: 0.1 10*3/uL (ref 0.0–0.7)
Eosinophils Relative: 1 %
HCT: 42 % (ref 39.0–52.0)
HEMOGLOBIN: 14.1 g/dL (ref 13.0–17.0)
LYMPHS ABS: 1.4 10*3/uL (ref 0.7–4.0)
LYMPHS PCT: 22 %
MCH: 32.8 pg (ref 26.0–34.0)
MCHC: 33.6 g/dL (ref 30.0–36.0)
MCV: 97.7 fL (ref 78.0–100.0)
Monocytes Absolute: 0.7 10*3/uL (ref 0.1–1.0)
Monocytes Relative: 11 %
NEUTROS PCT: 66 %
Neutro Abs: 4.3 10*3/uL (ref 1.7–7.7)
Platelets: 256 10*3/uL (ref 150–400)
RBC: 4.3 MIL/uL (ref 4.22–5.81)
RDW: 12.8 % (ref 11.5–15.5)
WBC: 6.5 10*3/uL (ref 4.0–10.5)

## 2014-09-26 MED ORDER — LEVOFLOXACIN 500 MG PO TABS: 500.0000 mg | ORAL_TABLET | Freq: Every day | ORAL | Status: DC

## 2014-09-26 MED ORDER — PREDNISONE 50 MG PO TABS: ORAL_TABLET | ORAL | Status: DC

## 2014-09-26 MED ORDER — IPRATROPIUM-ALBUTEROL 0.5-2.5 (3) MG/3ML IN SOLN
3.0000 mL | Freq: Once | RESPIRATORY_TRACT | Status: AC
  Administered 2014-09-26: 3 mL via RESPIRATORY_TRACT
  Filled 2014-09-26: qty 3

## 2014-09-26 MED ORDER — ALBUTEROL SULFATE (2.5 MG/3ML) 0.083% IN NEBU
2.5000 mg | INHALATION_SOLUTION | Freq: Once | RESPIRATORY_TRACT | Status: AC
  Administered 2014-09-26: 2.5 mg via RESPIRATORY_TRACT
  Filled 2014-09-26: qty 3

## 2014-09-26 MED ORDER — LEVOFLOXACIN 500 MG PO TABS
500.0000 mg | ORAL_TABLET | Freq: Once | ORAL | Status: AC
  Administered 2014-09-26: 500 mg via ORAL
  Filled 2014-09-26: qty 1

## 2014-09-26 MED ORDER — METHYLPREDNISOLONE SODIUM SUCC 125 MG IJ SOLR
125.0000 mg | Freq: Once | INTRAMUSCULAR | Status: AC
  Administered 2014-09-26: 125 mg via INTRAMUSCULAR
  Filled 2014-09-26: qty 2

## 2014-09-26 NOTE — Discharge Instructions (Signed)
Stop smoking. Prescription for antibiotic and prednisone. You have received a dose of each today. Start prescription tomorrow.

## 2014-09-26 NOTE — ED Provider Notes (Signed)
CSN: [id number]     Arrival date & time 09/26/14  1139 History   First MD Initiated Contact with Patient 09/26/14 1338     Chief Complaint  Patient presents with  . Shortness of Breath     (Consider location/radiation/quality/duration/timing/severity/associated sxs/prior Treatment) HPI..... Patient with known COPD on home oxygen presents with worsening dyspnea and wheezing for several days. He continues to smoke. He is on a minimum of 3 L of nasal oxygen at home. He has used his nebulizer machine, Symbicort, and albuterol inhaler with minimal relief. His son had an upper respiratory infection which he believes he has caught from his son.  No fever, sweats, chills.  Past Medical History  Diagnosis Date  . COPD (chronic obstructive pulmonary disease)   . High cholesterol   . Alcohol addiction   . Depression   . Bipolar 1 disorder   . PTSD (post-traumatic stress disorder)   . Anxiety   . Panic    Past Surgical History  Procedure Laterality Date  . Tonsillectomy     Family History  Problem Relation Age of Onset  . Coronary artery disease Mother   . Coronary artery disease Father    Social History  Substance Use Topics  . Smoking status: Current Every Day Smoker -- 0.03 packs/day for 45 years    Types: Cigarettes  . Smokeless tobacco: Never Used  . Alcohol Use: Yes     Comment: heavily    Review of Systems  All other systems reviewed and are negative.     Allergies  Review of patient's allergies indicates no known allergies.  Home Medications   Prior to Admission medications   Medication Sig Start Date End Date Taking? Authorizing Rex Oesterle  albuterol (PROVENTIL HFA) 108 (90 BASE) MCG/ACT inhaler Inhale 2 puffs into the lungs every 6 (six) hours as needed for wheezing or shortness of breath.   Yes Historical Batoul Limes, MD  budesonide-formoterol (SYMBICORT) 160-4.5 MCG/ACT inhaler Inhale 2 puffs into the lungs 2 (two) times daily.   Yes Historical Salihah Peckham, MD   diphenhydrAMINE (BENADRYL) 25 mg capsule Take 25 mg by mouth at bedtime.    Yes Historical Gussie Towson, MD  DULoxetine (CYMBALTA) 30 MG capsule Take 30 mg by mouth 2 (two) times daily.   Yes Historical Lexani Corona, MD  haloperidol (HALDOL) 0.5 MG tablet Take 0.5 mg by mouth daily. 11/18/13  Yes Historical Macey Wurtz, MD  ipratropium-albuterol (DUONEB) 0.5-2.5 (3) MG/3ML SOLN Take 3 mLs by nebulization every 6 (six) hours as needed (shortness of breath).    Yes Historical Anaya Bovee, MD  lithium carbonate 300 MG capsule Take 300 mg by mouth 2 (two) times daily with a meal.   Yes Historical Aishi Courts, MD  chlordiazePOXIDE (LIBRIUM) 25 MG capsule Take 1 capsule (25 mg total) by mouth 3 (three) times daily as needed for anxiety. Patient not taking: Reported on 09/26/2014 02/23/14   Dorie Rank, MD  ibuprofen (ADVIL,MOTRIN) 200 MG tablet Take 400 mg by mouth daily as needed for headache.    Historical Kevonna Nolte, MD  levofloxacin (LEVAQUIN) 500 MG tablet Take 1 tablet (500 mg total) by mouth daily. 09/26/14   Nat Christen, MD  LORazepam (ATIVAN) 1 MG tablet Take 1 tablet (1 mg total) by mouth 3 (three) times daily as needed for anxiety. Patient not taking: Reported on 02/24/2014 08/26/13   Tammy Triplett, PA-C  Naphazoline HCl (CLEAR EYES OP) Place 2 drops into both eyes daily as needed (red eyes).    Historical Mellina Benison, MD  oxyCODONE-acetaminophen (PERCOCET/ROXICET) 5-325  MG per tablet Take 1 tablet by mouth every 4 (four) hours as needed. Patient not taking: Reported on 02/24/2014 12/15/13   Tammy Triplett, PA-C  predniSONE (DELTASONE) 50 MG tablet 1 tablet for 5 days, one half tablet for 5 days 09/26/14   Nat Christen, MD   BP 161/83 mmHg  Pulse 96  Temp(Src) 97.4 F (36.3 C) (Oral)  Resp 13  Ht '5\' 8"'$  (1.727 m)  Wt 175 lb (79.379 kg)  BMI 26.61 kg/m2  SpO2 95% Physical Exam  Constitutional: He is oriented to person, place, and time. He appears well-developed and well-nourished.  HENT:  Head: Normocephalic and  atraumatic.  Eyes: Conjunctivae and EOM are normal. Pupils are equal, round, and reactive to light.  Neck: Normal range of motion. Neck supple.  Cardiovascular: Normal rate and regular rhythm.   Pulmonary/Chest: Effort normal.  bilat exp wheezing  Abdominal: Soft. Bowel sounds are normal.  Musculoskeletal: Normal range of motion.  Neurological: He is alert and oriented to person, place, and time.  Skin: Skin is warm and dry.  Psychiatric: He has a normal mood and affect. His behavior is normal.  Nursing note and vitals reviewed.   ED Course  Procedures (including critical care time) Labs Review Labs Reviewed  COMPREHENSIVE METABOLIC PANEL - Abnormal; Notable for the following:    Glucose, Bld 108 (*)    ALT 16 (*)    All other components within normal limits  CBC WITH DIFFERENTIAL/PLATELET    Imaging Review Dg Chest 2 View  09/26/2014   CLINICAL DATA:  Shortness of breath and productive cough.  EXAM: CHEST  2 VIEW  COMPARISON:  02/24/2014  FINDINGS: Cardiomediastinal silhouette is normal. Mediastinal contours appear intact.  There is no evidence of focal airspace consolidation or pneumothorax. There is a small right pleural effusion. Emphysematous changes are again seen.  Osseous structures are without acute abnormality. Soft tissues are grossly normal.  IMPRESSION: Small right pleural effusion.  Chronic emphysematous changes.   Electronically Signed   By: Fidela Salisbury M.D.   On: 09/26/2014 12:59   I have personally reviewed and evaluated these images and lab results as part of my medical decision-making.   EKG Interpretation None      MDM   Final diagnoses:  COPD exacerbation    Patient is in no acute distress. He feels better after albuterol/Atrovent nebulizer treatment. Vital signs are stable. Chest x-ray shows a small right pleural effusion. IM Solu-Medrol given here. Discharge medications Levaquin 500 mg and prednisone.    Nat Christen, MD 09/26/14 [phone]

## 2014-09-26 NOTE — ED Notes (Signed)
Respiratory at bedside.

## 2014-09-26 NOTE — ED Notes (Signed)
Patient c/o shortness of breath with exertion. Patient reports productive cough with thick white sputum. Congested cough noted. Per patient son at home has URI recently before his started. Patient wears O2 via [place] at home. 3 liters with activity and 2l stationary. Per patient O2 sats in 80s with exertion. Denies any fevers. Patient reports dificulty taking a deep breath and soreness in chest from cough.

## 2014-09-26 NOTE — ED Notes (Signed)
12 lead completed in triage, given to Dr. Lacinda Axon

## 2014-09-26 NOTE — ED Notes (Signed)
Pt c/o worsening SOB over the last couple days. Pt reports the only way he could breathe was at rest. His son at home has had recent URI. Pt also reports productive thick white sputum with cough.

## 2014-11-20 ENCOUNTER — Emergency Department (HOSPITAL_COMMUNITY)
Admission: EM | Admit: 2014-11-20 | Discharge: 2014-11-20 | Disposition: A | Discharge status: Home / Self Care | Payer: Medicare Other | Attending: Emergency Medicine | Admitting: Emergency Medicine

## 2014-11-20 ENCOUNTER — Encounter (HOSPITAL_COMMUNITY): Payer: Self-pay | Admitting: Emergency Medicine

## 2014-11-20 DIAGNOSIS — T2029XA Burn of second degree of multiple sites of head, face, and neck, initial encounter: Secondary | ICD-10-CM | POA: Diagnosis not present

## 2014-11-20 DIAGNOSIS — Z7951 Long term (current) use of inhaled steroids: Secondary | ICD-10-CM | POA: Insufficient documentation

## 2014-11-20 DIAGNOSIS — Z9981 Dependence on supplemental oxygen: Secondary | ICD-10-CM | POA: Diagnosis not present

## 2014-11-20 DIAGNOSIS — J441 Chronic obstructive pulmonary disease with (acute) exacerbation: Secondary | ICD-10-CM | POA: Insufficient documentation

## 2014-11-20 DIAGNOSIS — F17210 Nicotine dependence, cigarettes, uncomplicated: Secondary | ICD-10-CM | POA: Diagnosis not present

## 2014-11-20 DIAGNOSIS — F410 Panic disorder [episodic paroxysmal anxiety] without agoraphobia: Secondary | ICD-10-CM | POA: Insufficient documentation

## 2014-11-20 DIAGNOSIS — Z8639 Personal history of other endocrine, nutritional and metabolic disease: Secondary | ICD-10-CM | POA: Insufficient documentation

## 2014-11-20 DIAGNOSIS — Y9389 Activity, other specified: Secondary | ICD-10-CM | POA: Diagnosis not present

## 2014-11-20 DIAGNOSIS — X088XXA Exposure to other specified smoke, fire and flames, initial encounter: Secondary | ICD-10-CM | POA: Insufficient documentation

## 2014-11-20 DIAGNOSIS — T2020XA Burn of second degree of head, face, and neck, unspecified site, initial encounter: Secondary | ICD-10-CM

## 2014-11-20 DIAGNOSIS — Z23 Encounter for immunization: Secondary | ICD-10-CM | POA: Diagnosis not present

## 2014-11-20 DIAGNOSIS — Y9289 Other specified places as the place of occurrence of the external cause: Secondary | ICD-10-CM | POA: Diagnosis not present

## 2014-11-20 DIAGNOSIS — F319 Bipolar disorder, unspecified: Secondary | ICD-10-CM | POA: Diagnosis not present

## 2014-11-20 DIAGNOSIS — Z79899 Other long term (current) drug therapy: Secondary | ICD-10-CM | POA: Insufficient documentation

## 2014-11-20 DIAGNOSIS — Y998 Other external cause status: Secondary | ICD-10-CM | POA: Insufficient documentation

## 2014-11-20 DIAGNOSIS — S0993XA Unspecified injury of face, initial encounter: Secondary | ICD-10-CM | POA: Diagnosis present

## 2014-11-20 MED ORDER — TETANUS-DIPHTH-ACELL PERTUSSIS 5-2.5-18.5 LF-MCG/0.5 IM SUSP
0.5000 mL | Freq: Once | INTRAMUSCULAR | Status: AC
  Administered 2014-11-20: 0.5 mL via INTRAMUSCULAR
  Filled 2014-11-20: qty 0.5

## 2014-11-20 MED ORDER — HYDROMORPHONE HCL 1 MG/ML IJ SOLN
1.0000 mg | Freq: Once | INTRAMUSCULAR | Status: AC
  Administered 2014-11-20: 1 mg via INTRAMUSCULAR
  Filled 2014-11-20: qty 1

## 2014-11-20 MED ORDER — TETRACAINE HCL 0.5 % OP SOLN
1.0000 [drp] | Freq: Once | OPHTHALMIC | Status: DC
  Filled 2014-11-20: qty 2

## 2014-11-20 MED ORDER — SALINE SPRAY 0.65 % NA SOLN: 1.0000 | NASAL | Status: DC | PRN

## 2014-11-20 MED ORDER — FLUORESCEIN SODIUM 1 MG OP STRP
1.0000 | ORAL_STRIP | Freq: Once | OPHTHALMIC | Status: DC
  Filled 2014-11-20: qty 1

## 2014-11-20 MED ORDER — IPRATROPIUM-ALBUTEROL 0.5-2.5 (3) MG/3ML IN SOLN
3.0000 mL | Freq: Once | RESPIRATORY_TRACT | Status: AC
  Administered 2014-11-20: 3 mL via RESPIRATORY_TRACT
  Filled 2014-11-20: qty 3

## 2014-11-20 MED ORDER — HYDROCODONE-ACETAMINOPHEN 5-325 MG PO TABS: 1.0000 | ORAL_TABLET | ORAL | Status: DC | PRN
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
## 2014-11-20 NOTE — ED Notes (Signed)
[facility] for consult for Dr Oleta Mouse. Will page the Dr on call to [phone]

## 2015-10-31 ENCOUNTER — Emergency Department (HOSPITAL_COMMUNITY): Payer: Medicare Other

## 2015-10-31 ENCOUNTER — Inpatient Hospital Stay (HOSPITAL_COMMUNITY)
Admission: EM | Admit: 2015-10-31 | Discharge: 2015-11-02 | DRG: 190 | Disposition: A | Discharge status: Home / Self Care | Payer: Medicare Other | Attending: Internal Medicine | Admitting: Internal Medicine

## 2015-10-31 ENCOUNTER — Encounter (HOSPITAL_COMMUNITY): Payer: Self-pay | Admitting: Emergency Medicine

## 2015-10-31 DIAGNOSIS — J441 Chronic obstructive pulmonary disease with (acute) exacerbation: Principal | ICD-10-CM

## 2015-10-31 DIAGNOSIS — F319 Bipolar disorder, unspecified: Secondary | ICD-10-CM

## 2015-10-31 DIAGNOSIS — J9621 Acute and chronic respiratory failure with hypoxia: Secondary | ICD-10-CM | POA: Diagnosis present

## 2015-10-31 DIAGNOSIS — Z8249 Family history of ischemic heart disease and other diseases of the circulatory system: Secondary | ICD-10-CM

## 2015-10-31 DIAGNOSIS — J449 Chronic obstructive pulmonary disease, unspecified: Secondary | ICD-10-CM | POA: Diagnosis present

## 2015-10-31 DIAGNOSIS — F17210 Nicotine dependence, cigarettes, uncomplicated: Secondary | ICD-10-CM | POA: Diagnosis present

## 2015-10-31 DIAGNOSIS — Z9981 Dependence on supplemental oxygen: Secondary | ICD-10-CM | POA: Diagnosis not present

## 2015-10-31 DIAGNOSIS — Z79899 Other long term (current) drug therapy: Secondary | ICD-10-CM

## 2015-10-31 DIAGNOSIS — Z7951 Long term (current) use of inhaled steroids: Secondary | ICD-10-CM

## 2015-10-31 DIAGNOSIS — F1020 Alcohol dependence, uncomplicated: Secondary | ICD-10-CM

## 2015-10-31 DIAGNOSIS — J410 Simple chronic bronchitis: Secondary | ICD-10-CM | POA: Diagnosis not present

## 2015-10-31 DIAGNOSIS — F419 Anxiety disorder, unspecified: Secondary | ICD-10-CM | POA: Diagnosis present

## 2015-10-31 DIAGNOSIS — Z23 Encounter for immunization: Secondary | ICD-10-CM

## 2015-10-31 DIAGNOSIS — F4310 Post-traumatic stress disorder, unspecified: Secondary | ICD-10-CM | POA: Diagnosis present

## 2015-10-31 DIAGNOSIS — E7800 Pure hypercholesterolemia, unspecified: Secondary | ICD-10-CM

## 2015-10-31 LAB — URINALYSIS, ROUTINE W REFLEX MICROSCOPIC
Bilirubin Urine: NEGATIVE
GLUCOSE, UA: NEGATIVE mg/dL
Ketones, ur: NEGATIVE mg/dL
LEUKOCYTES UA: NEGATIVE
Nitrite: NEGATIVE
PROTEIN: NEGATIVE mg/dL
Specific Gravity, Urine: 1.01 (ref 1.005–1.030)
pH: 5.5 (ref 5.0–8.0)

## 2015-10-31 LAB — CBC
HCT: 40.8 % (ref 39.0–52.0)
HEMOGLOBIN: 13.4 g/dL (ref 13.0–17.0)
MCH: 30.7 pg (ref 26.0–34.0)
MCHC: 32.8 g/dL (ref 30.0–36.0)
MCV: 93.6 fL (ref 78.0–100.0)
PLATELETS: 231 10*3/uL (ref 150–400)
RBC: 4.36 MIL/uL (ref 4.22–5.81)
RDW: 12.9 % (ref 11.5–15.5)
WBC: 3.5 10*3/uL — AB (ref 4.0–10.5)

## 2015-10-31 LAB — BASIC METABOLIC PANEL
ANION GAP: 7 (ref 5–15)
BUN: 10 mg/dL (ref 6–20)
CHLORIDE: 99 mmol/L — AB (ref 101–111)
CO2: 29 mmol/L (ref 22–32)
CREATININE: 0.79 mg/dL (ref 0.61–1.24)
Calcium: 9.3 mg/dL (ref 8.9–10.3)
GFR calc non Af Amer: >60 mL/min (ref >60)
Glucose, Bld: 97 mg/dL (ref 65–99)
POTASSIUM: 4 mmol/L (ref 3.5–5.1)
SODIUM: 135 mmol/L (ref 135–145)

## 2015-10-31 LAB — URINE MICROSCOPIC-ADD ON

## 2015-10-31 LAB — TROPONIN I

## 2015-10-31 MED ORDER — MAGNESIUM CITRATE PO SOLN: 1.0000 | Freq: Once | ORAL | Status: DC | PRN

## 2015-10-31 MED ORDER — METHYLPREDNISOLONE SODIUM SUCC 125 MG IJ SOLR
80.0000 mg | Freq: Three times a day (TID) | INTRAMUSCULAR | Status: DC
  Administered 2015-10-31 – 2015-11-02 (×6): 80 mg via INTRAVENOUS
  Filled 2015-10-31 (×6): qty 2

## 2015-10-31 MED ORDER — INFLUENZA VAC SPLIT QUAD 0.5 ML IM SUSY
0.5000 mL | PREFILLED_SYRINGE | INTRAMUSCULAR | Status: AC
  Administered 2015-11-01: 0.5 mL via INTRAMUSCULAR
  Filled 2015-10-31: qty 0.5

## 2015-10-31 MED ORDER — ALBUTEROL SULFATE (2.5 MG/3ML) 0.083% IN NEBU
2.5000 mg | INHALATION_SOLUTION | RESPIRATORY_TRACT | Status: DC | PRN
  Administered 2015-10-31: 2.5 mg via RESPIRATORY_TRACT
  Filled 2015-10-31: qty 3

## 2015-10-31 MED ORDER — HYDROCODONE-ACETAMINOPHEN 5-325 MG PO TABS: 1.0000 | ORAL_TABLET | ORAL | Status: DC | PRN

## 2015-10-31 MED ORDER — ALBUTEROL SULFATE (2.5 MG/3ML) 0.083% IN NEBU
5.0000 mg | INHALATION_SOLUTION | Freq: Once | RESPIRATORY_TRACT | Status: AC
  Administered 2015-10-31: 5 mg via RESPIRATORY_TRACT
  Filled 2015-10-31: qty 6

## 2015-10-31 MED ORDER — LEVOFLOXACIN 500 MG PO TABS
500.0000 mg | ORAL_TABLET | Freq: Every day | ORAL | Status: DC
  Administered 2015-10-31 – 2015-11-02 (×3): 500 mg via ORAL
  Filled 2015-10-31 (×3): qty 1

## 2015-10-31 MED ORDER — GUAIFENESIN-DM 100-10 MG/5ML PO SYRP: 10.0000 mL | ORAL_SOLUTION | ORAL | Status: DC | PRN

## 2015-10-31 MED ORDER — ACETAMINOPHEN 325 MG PO TABS
650.0000 mg | ORAL_TABLET | Freq: Four times a day (QID) | ORAL | Status: DC | PRN
Start: 2015-10-31 — End: 2015-11-02

## 2015-10-31 MED ORDER — MOMETASONE FURO-FORMOTEROL FUM 200-5 MCG/ACT IN AERO
2.0000 | INHALATION_SPRAY | Freq: Two times a day (BID) | RESPIRATORY_TRACT | Status: DC
  Administered 2015-10-31 – 2015-11-02 (×4): 2 via RESPIRATORY_TRACT
  Filled 2015-10-31: qty 8.8

## 2015-10-31 MED ORDER — METHYLPREDNISOLONE SODIUM SUCC 125 MG IJ SOLR
125.0000 mg | Freq: Once | INTRAMUSCULAR | Status: AC
  Administered 2015-10-31: 125 mg via INTRAVENOUS
  Filled 2015-10-31: qty 2

## 2015-10-31 MED ORDER — IPRATROPIUM BROMIDE 0.02 % IN SOLN
1.0000 mg | Freq: Once | RESPIRATORY_TRACT | Status: AC
  Administered 2015-10-31: 1 mg via RESPIRATORY_TRACT
  Filled 2015-10-31: qty 5

## 2015-10-31 MED ORDER — ALBUTEROL (5 MG/ML) CONTINUOUS INHALATION SOLN
10.0000 mg/h | INHALATION_SOLUTION | Freq: Once | RESPIRATORY_TRACT | Status: AC
  Administered 2015-10-31: 10 mg/h via RESPIRATORY_TRACT
  Filled 2015-10-31: qty 20

## 2015-10-31 MED ORDER — HALOPERIDOL 0.5 MG PO TABS
0.5000 mg | ORAL_TABLET | Freq: Every day | ORAL | Status: DC
  Administered 2015-10-31 – 2015-11-01 (×2): 0.5 mg via ORAL
  Filled 2015-10-31 (×4): qty 1

## 2015-10-31 MED ORDER — MOMETASONE FURO-FORMOTEROL FUM 200-5 MCG/ACT IN AERO: 2.0000 | INHALATION_SPRAY | Freq: Two times a day (BID) | RESPIRATORY_TRACT | Status: DC

## 2015-10-31 MED ORDER — ONDANSETRON HCL 4 MG/2ML IJ SOLN: 4.0000 mg | Freq: Four times a day (QID) | INTRAMUSCULAR | Status: DC | PRN

## 2015-10-31 MED ORDER — MOMETASONE FURO-FORMOTEROL FUM 200-5 MCG/ACT IN AERO
INHALATION_SPRAY | RESPIRATORY_TRACT | Status: AC
  Filled 2015-10-31: qty 8.8

## 2015-10-31 MED ORDER — IPRATROPIUM-ALBUTEROL 0.5-2.5 (3) MG/3ML IN SOLN
3.0000 mL | Freq: Four times a day (QID) | RESPIRATORY_TRACT | Status: DC
  Administered 2015-10-31 – 2015-11-02 (×7): 3 mL via RESPIRATORY_TRACT
  Filled 2015-10-31 (×7): qty 3

## 2015-10-31 MED ORDER — LITHIUM CARBONATE 150 MG PO CAPS
300.0000 mg | ORAL_CAPSULE | Freq: Two times a day (BID) | ORAL | Status: DC
  Administered 2015-10-31 – 2015-11-02 (×4): 300 mg via ORAL
  Filled 2015-10-31 (×4): qty 2

## 2015-10-31 MED ORDER — ENOXAPARIN SODIUM 40 MG/0.4ML ~~LOC~~ SOLN
40.0000 mg | SUBCUTANEOUS | Status: DC
  Administered 2015-10-31 – 2015-11-01 (×2): 40 mg via SUBCUTANEOUS
  Filled 2015-10-31 (×2): qty 0.4

## 2015-10-31 MED ORDER — DOCUSATE SODIUM 100 MG PO CAPS
200.0000 mg | ORAL_CAPSULE | Freq: Two times a day (BID) | ORAL | Status: DC
Start: 2015-10-31 — End: 2015-11-02
  Administered 2015-10-31 – 2015-11-02 (×3): 200 mg via ORAL
  Filled 2015-10-31 (×4): qty 2

## 2015-10-31 NOTE — H&P (Signed)
TRH H&P   Patient Demographics:    Robert [name], is a 60 y.o. male  MRN: [id number]   DOB - 11/23/1955  Admit Date - 10/31/2015  Outpatient Primary MD for the patient is [facility], PA-C  Outpatient Specialists: Pulmonologist in [place]  Patient coming from: Home  Chief Complaint  Patient presents with  . Shortness of Breath      HPI:    Robert [name]  is a 60 y.o. male, With history of COPD on 2 L nasal cannula oxygen pulmonologist in [place], ongoing smoking counseled to quit, bipolar disorder, who's been having problems with a dry cough and gradually progressive shortness of breath and wheezing for the last 2-3 days comes to the hospital with these complaints. In the ER diagnosed with acute on chronic COPD exacerbation with hypoxia and I was called to admit the patient.  Eyes any fever or chills, no exposure to sick contacts, no recent travel, some chest and abdominal wall pain associated with coughing episodes, no diarrhea, no dysuria, no blood in stool or urine or focal weakness. No history is resistive of orthopnea or weight gain.    Review of systems:    In addition to the HPI above,   No Fever-chills, No Headache, No changes with Vision or hearing, No problems swallowing food or Liquids, No Chest pain, ++ Cough & Shortness of Breath, No Abdominal pain, No Nausea or Vommitting, Bowel movements are regular, No Blood in stool or Urine, No dysuria, No new skin rashes or bruises, No new joints pains-aches,  No new weakness, tingling, numbness in any extremity, No recent weight gain or loss, No polyuria, polydypsia or polyphagia, No significant Mental Stressors.  A full 10 point  Review of Systems was done, except as stated above, all other Review of Systems were negative.   With Past History of the following :    Past Medical History:  Diagnosis Date  . Alcohol addiction ([place])   . Anxiety   . Bipolar 1 disorder ([place])   . COPD (chronic obstructive pulmonary disease) ([place])   . Depression   . High cholesterol   . Panic   . PTSD (post-traumatic stress disorder)       Past Surgical History:  Procedure Laterality Date  . TONSILLECTOMY        Social History:  Social History  Substance Use Topics  . Smoking status: Current Every Day Smoker    Packs/day: 0.50    Years: 45.00    Types: Cigarettes  . Smokeless tobacco: Never Used  . Alcohol use Yes     Comment: heavily         Family History :     Family History  Problem Relation Age of Onset  . Coronary artery disease Mother   . Coronary artery disease Father        Home Medications:   Prior to Admission medications   Medication Sig Start Date End Date Taking? Authorizing Provider  albuterol (PROVENTIL HFA) 108 (90 BASE) MCG/ACT inhaler Inhale 2 puffs into the lungs every 6 (six) hours as needed for wheezing or shortness of breath.   Yes Historical Provider, MD  budesonide-formoterol (SYMBICORT) 160-4.5 MCG/ACT inhaler Inhale 2 puffs into the lungs 2 (two) times daily.   Yes Historical Provider, MD  diphenhydrAMINE (BENADRYL) 25 mg capsule Take 25 mg by mouth at bedtime.    Yes Historical Provider, MD  haloperidol (HALDOL) 0.5 MG tablet Take 0.5 mg by mouth at bedtime.  11/18/13  Yes Historical Provider, MD  ibuprofen (ADVIL,MOTRIN) 200 MG tablet Take 400 mg by mouth daily as needed for headache.   Yes Historical Provider, MD  ipratropium-albuterol (DUONEB) 0.5-2.5 (3) MG/3ML SOLN Take 3 mLs by nebulization every 6 (six) hours as needed (shortness of breath).    Yes Historical Provider, MD  lithium carbonate 300 MG capsule Take 300 mg by mouth 2 (two) times daily with a meal.   Yes  Historical Provider, MD  Naphazoline HCl (CLEAR EYES OP) Place 2 drops into both eyes daily as needed (red eyes).   Yes Historical Provider, MD  sodium chloride (OCEAN) 0.65 % SOLN nasal spray Place 1 spray into both nostrils as needed for congestion. 11/20/14  Yes Forde Dandy, MD     Allergies:    No Known Allergies   Physical Exam:   Vitals  Blood pressure 138/87, pulse 96, temperature 98.6 F (37 C), temperature source Oral, resp. rate 19, height '5\' 8"'$  (1.727 m), weight 83.5 kg (184 lb), SpO2 97 %.   1. General Middle-aged Caucasian male sitting in hospital bed in mild shortness of breath,  2. Normal affect and insight, Not Suicidal or Homicidal, Awake Alert, Oriented X 3.  3. No F.N deficits, ALL C.Nerves Intact, Strength 5/5 all 4 extremities, Sensation intact all 4 extremities, Plantars down going.  4. Ears and Eyes appear Normal, Conjunctivae clear, PERRLA. Moist Oral Mucosa.  5. Supple Neck, No JVD, No cervical lymphadenopathy appriciated, No Carotid Bruits.  6. Symmetrical Chest wall movement, Minimal air movement bilaterally, bilateral wheezing  7. RRR, No Gallops, Rubs or Murmurs, No Parasternal Heave.  8. Positive Bowel Sounds, Abdomen Soft, No tenderness, No organomegaly appriciated,No rebound -guarding or rigidity.  9.  No Cyanosis, Normal Skin Turgor, No Skin Rash or Bruise.  10. Good muscle tone,  joints appear normal , no effusions, Normal ROM.  11. No Palpable Lymph Nodes in Neck or Axillae      Data Review:    CBC  Recent Labs Lab 10/31/15 1218  WBC 3.5*  HGB 13.4  HCT 40.8  PLT 231  MCV 93.6  MCH 30.7  MCHC 32.8  RDW 12.9   ------------------------------------------------------------------------------------------------------------------  Chemistries   Recent Labs Lab 10/31/15 1218  NA 135  K 4.0  CL 99*  CO2 29  GLUCOSE 97  BUN 10  CREATININE 0.79  CALCIUM 9.3    ------------------------------------------------------------------------------------------------------------------ estimated creatinine clearance is 103.3 mL/min (by C-G formula based on SCr of 0.79 mg/dL). ------------------------------------------------------------------------------------------------------------------ No results for input(s): TSH, T4TOTAL, T3FREE, THYROIDAB in the last 72 hours.  Invalid input(s): FREET3  Coagulation profile No results for input(s): INR, PROTIME in the last 168 hours. ------------------------------------------------------------------------------------------------------------------- No results for input(s): DDIMER in the last 72 hours. -------------------------------------------------------------------------------------------------------------------  Cardiac Enzymes  Recent Labs Lab 10/31/15 1218  TROPONINI <0.03   ------------------------------------------------------------------------------------------------------------------ No results found for: BNP   ---------------------------------------------------------------------------------------------------------------  Urinalysis    Component Value Date/Time   COLORURINE YELLOW 10/31/2015 [place] 10/31/2015 1253   APPEARANCEUR Clear 11/14/2013 1034   LABSPEC 1.010 10/31/2015 1253   LABSPEC 1.003 11/14/2013 1034   PHURINE 5.5 10/31/2015 [place] 10/31/2015 1253   GLUCOSEU Negative 11/14/2013 1034   HGBUR SMALL (A) 10/31/2015 1253   BILIRUBINUR NEGATIVE 10/31/2015 1253   BILIRUBINUR Negative 11/14/2013 1034   KETONESUR NEGATIVE 10/31/2015 1253   PROTEINUR NEGATIVE 10/31/2015 1253   NITRITE NEGATIVE 10/31/2015 1253   LEUKOCYTESUR NEGATIVE 10/31/2015 1253   LEUKOCYTESUR Negative 11/14/2013 1034    ----------------------------------------------------------------------------------------------------------------   Imaging Results:    Dg Chest 2  View  Result Date: 10/31/2015 CLINICAL DATA:  Worsening shortness of breath and productive cough for 3-4 days. COPD. EXAM: CHEST  2 VIEW COMPARISON:  09/26/2014. FINDINGS: The heart size and mediastinal contours are within normal limits. Both lungs are clear. The visualized skeletal structures are unremarkable. Chronic emphysematous changes. Minimal blunting RIGHT CP angle stable. IMPRESSION: COPD.  No active disease. Electronically Signed   By: Staci Righter M.D.   On: 10/31/2015 14:36    My personal review of EKG: Rhythm NSR,  no Acute ST changes   Assessment & Plan:      1. Acute on chronic hypoxic respiratory failure secondary to COPD exacerbation. On exam patient seems to have advanced COPD at baseline, he is moving minimal air bilaterally, does have wheezing, he has been strictly counseled to quit smoking, he will be admitted, IV Solu-Medrol, oral Levaquin, scheduled and as needed neb treatments along with oxygen as needed. Add flutter valve for pulmonary toiletry. Post discharge follow with PCP and pulmonologist.  2. Ongoing smoking. Counseled to quit.  3. Bipolar disorder, PTSD. Both stable continue home medications unchanged.    DVT Prophylaxis  Lovenox   AM Labs Ordered, also please review Full Orders  Family Communication: Admission, patients condition and plan of care including tests being ordered have been discussed with the patient and daughter who indicate understanding and agree with the plan and Code Status.  Code Status Full  Likely DC to  Home 2-3 days  Condition Fair  Consults called: none    Admission status: Inpt    Time spent in minutes : Full   Thurnell Lose M.D on 10/31/2015 at 3:51 PM  Between 7am to 7pm - Pager - [phone]. After 7pm go to [email or web address] - password [email or web address]  [facility] - Office  [phone]

## 2015-10-31 NOTE — ED Notes (Signed)
3rd floor charge nurse called to assess pt's bed status. Charge RN said they are not accepting any new admissions at this time. Charge RN reported she will assign bed ready when floor RN is able to accept pt. ED Charge RN notified.

## 2015-10-31 NOTE — ED Provider Notes (Signed)
[facility] DEPT Provider Note   CSN: [id number] Arrival date & time: 10/31/15  1140     History   Chief Complaint Chief Complaint  Patient presents with  . Shortness of Breath    HPI Robert [name] is a 60 y.o. male.   Shortness of Breath     Pt was seen at 1245.  Per pt, c/o gradual onset and worsening of persistent cough, wheezing and SOB for the past 2 to 3 days.  Describes his symptoms as "my COPD."  Has been using home MDI and nebs without relief.  Has increased his home O2 to 4L N/C due to increasing SOB. Denies CP/palpitations, no back pain, no abd pain, no N/V/D, no fevers, no rash.    Past Medical History:  Diagnosis Date  . Alcohol addiction ([place])   . Anxiety   . Bipolar 1 disorder ([place])   . COPD (chronic obstructive pulmonary disease) ([place])   . Depression   . High cholesterol   . Panic   . PTSD (post-traumatic stress disorder)     There are no active problems to display for this patient.   Past Surgical History:  Procedure Laterality Date  . TONSILLECTOMY         Home Medications    Prior to Admission medications   Medication Sig Start Date End Date Taking? Authorizing Provider  albuterol (PROVENTIL HFA) 108 (90 BASE) MCG/ACT inhaler Inhale 2 puffs into the lungs every 6 (six) hours as needed for wheezing or shortness of breath.   Yes Historical Provider, MD  budesonide-formoterol (SYMBICORT) 160-4.5 MCG/ACT inhaler Inhale 2 puffs into the lungs 2 (two) times daily.   Yes Historical Provider, MD  diphenhydrAMINE (BENADRYL) 25 mg capsule Take 25 mg by mouth at bedtime.    Yes Historical Provider, MD  haloperidol (HALDOL) 0.5 MG tablet Take 0.5 mg by mouth at bedtime.  11/18/13  Yes Historical Provider, MD  ibuprofen (ADVIL,MOTRIN) 200 MG tablet Take 400 mg by mouth daily as needed for headache.   Yes Historical Provider, MD  ipratropium-albuterol (DUONEB) 0.5-2.5 (3) MG/3ML SOLN Take 3 mLs by nebulization every 6 (six) hours as needed  (shortness of breath).    Yes Historical Provider, MD  lithium carbonate 300 MG capsule Take 300 mg by mouth 2 (two) times daily with a meal.   Yes Historical Provider, MD  Naphazoline HCl (CLEAR EYES OP) Place 2 drops into both eyes daily as needed (red eyes).   Yes Historical Provider, MD  sodium chloride (OCEAN) 0.65 % SOLN nasal spray Place 1 spray into both nostrils as needed for congestion. 11/20/14  Yes Forde Dandy, MD    Family History Family History  Problem Relation Age of Onset  . Coronary artery disease Mother   . Coronary artery disease Father     Social History Social History  Substance Use Topics  . Smoking status: Current Every Day Smoker    Packs/day: 0.50    Years: 45.00    Types: Cigarettes  . Smokeless tobacco: Never Used  . Alcohol use Yes     Comment: heavily     Allergies   Review of patient's allergies indicates no known allergies.   Review of Systems Review of Systems  Respiratory: Positive for shortness of breath.   ROS: Statement: All systems negative except as marked or noted in the HPI; Constitutional: Negative for fever and chills. ; ; Eyes: Negative for eye pain, redness and discharge. ; ; ENMT: Negative for ear pain, hoarseness,  nasal congestion, sinus pressure and sore throat. ; ; Cardiovascular: Negative for chest pain, palpitations, diaphoresis, and peripheral edema. ; ; Respiratory: +SOB, wheezing, cough. Negative for stridor. ; ; Gastrointestinal: Negative for nausea, vomiting, diarrhea, abdominal pain, blood in stool, hematemesis, jaundice and rectal bleeding. . ; ; Genitourinary: Negative for dysuria, flank pain and hematuria. ; ; Musculoskeletal: Negative for back pain and neck pain. Negative for swelling and trauma.; ; Skin: Negative for pruritus, rash, abrasions, blisters, bruising and skin lesion.; ; Neuro: Negative for headache, lightheadedness and neck stiffness. Negative for weakness, altered level of consciousness, altered mental  status, extremity weakness, paresthesias, involuntary movement, seizure and syncope.      Physical Exam Updated Vital Signs BP 155/96 (BP Location: Left Arm)   Pulse 95   Temp 98.6 F (37 C) (Oral)   Resp 15   Ht '5\' 8"'$  (1.727 m)   Wt 184 lb (83.5 kg)   SpO2 99%   BMI 27.98 kg/m   Physical Exam 1250: Physical examination:  Nursing notes reviewed; Vital signs and O2 SAT reviewed;  Constitutional: Well developed, Well nourished, Well hydrated, Uncomfortable appearing; Head:  Normocephalic, atraumatic; Eyes: EOMI, PERRL, No scleral icterus; ENMT: Mouth and pharynx normal, Mucous membranes moist; Neck: Supple, Full range of motion, No lymphadenopathy; Cardiovascular: Regular rate and rhythm, No gallop; Respiratory: Breath sounds diminished & equal bilaterally, insp/exp wheezes bilat, faint audible wheezing.  Speaking short sentences, sitting upright, tachypneic.; Chest: Nontender, Movement normal; Abdomen: Soft, Nontender, Nondistended, Normal bowel sounds; Genitourinary: No CVA tenderness; Extremities: Pulses normal, No tenderness, No edema, No calf edema or asymmetry.; Neuro: AA&Ox3, Major CN grossly intact.  Speech clear. No gross focal motor deficits in extremities.; Skin: Color normal, Warm, Dry.   ED Treatments / Results  Labs (all labs ordered are listed, but only abnormal results are displayed)   EKG  EKG Interpretation  Date/Time:  Monday October 31 2015 11:51:16 EDT Ventricular Rate:  97 PR Interval:    QRS Duration: 85 QT Interval:  355 QTC Calculation: 451 R Axis:   78 Text Interpretation:  Sinus rhythm Consider anterior infarct Baseline wander When compared with ECG of 09/26/2014 No significant change was found Confirmed by [facility]  MD, Nunzio Cory [phone]) on 10/31/2015 2:27:23 PM       Radiology No results found.  Procedures Procedures (including critical care time)  Medications Ordered in ED Medications  methylPREDNISolone sodium succinate (SOLU-MEDROL) 125  mg/2 mL injection 125 mg (not administered)  albuterol (PROVENTIL,VENTOLIN) solution continuous neb (not administered)  ipratropium (ATROVENT) nebulizer solution 1 mg (not administered)  albuterol (PROVENTIL) (2.5 MG/3ML) 0.083% nebulizer solution 5 mg (5 mg Nebulization Given 10/31/15 1230)     Initial Impression / Assessment and Plan / ED Course  I have reviewed the triage vital signs and the nursing notes.  Pertinent labs & imaging results that were available during my care of the patient were reviewed by me and considered in my medical decision making (see chart for details).  MDM Reviewed: previous chart, nursing note and vitals Reviewed previous: labs and ECG Interpretation: labs, ECG and x-ray Total time providing critical care: 30-74 minutes. This excludes time spent performing separately reportable procedures and services. Consults: admitting MD   CRITICAL CARE Performed by: Alfonzo Feller Total critical care time: 35 minutes Critical care time was exclusive of separately billable procedures and treating other patients. Critical care was necessary to treat or prevent imminent or life-threatening deterioration. Critical care was time spent personally by me on the following activities:  development of treatment plan with patient and/or surrogate as well as nursing, discussions with consultants, evaluation of patient's response to treatment, examination of patient, obtaining history from patient or surrogate, ordering and performing treatments and interventions, ordering and review of laboratory studies, ordering and review of radiographic studies, pulse oximetry and re-evaluation of patient's condition.  Results for orders placed or performed during the hospital encounter of 30/16/01  Basic metabolic panel  Result Value Ref Range   Sodium 135 135 - 145 mmol/L   Potassium 4.0 3.5 - 5.1 mmol/L   Chloride 99 (L) 101 - 111 mmol/L   CO2 29 22 - 32 mmol/L   Glucose, Bld 97 65 -  99 mg/dL   BUN 10 6 - 20 mg/dL   Creatinine, Ser 0.79 0.61 - 1.24 mg/dL   Calcium 9.3 8.9 - 10.3 mg/dL   GFR calc non Af Amer >60 >60 mL/min   GFR calc Af Amer >60 >60 mL/min   Anion gap 7 5 - 15  CBC  Result Value Ref Range   WBC 3.5 (L) 4.0 - 10.5 K/uL   RBC 4.36 4.22 - 5.81 MIL/uL   Hemoglobin 13.4 13.0 - 17.0 g/dL   HCT 40.8 39.0 - 52.0 %   MCV 93.6 78.0 - 100.0 fL   MCH 30.7 26.0 - 34.0 pg   MCHC 32.8 30.0 - 36.0 g/dL   RDW 12.9 11.5 - 15.5 %   Platelets 231 150 - 400 K/uL  Urinalysis, Routine w reflex microscopic  Result Value Ref Range   Color, Urine YELLOW YELLOW   APPearance CLEAR CLEAR   Specific Gravity, Urine 1.010 1.005 - 1.030   pH 5.5 5.0 - 8.0   Glucose, UA NEGATIVE NEGATIVE mg/dL   Hgb urine dipstick SMALL (A) NEGATIVE   Bilirubin Urine NEGATIVE NEGATIVE   Ketones, ur NEGATIVE NEGATIVE mg/dL   Protein, ur NEGATIVE NEGATIVE mg/dL   Nitrite NEGATIVE NEGATIVE   Leukocytes, UA NEGATIVE NEGATIVE  Troponin I  Result Value Ref Range   Troponin I <0.03 <0.03 ng/mL  Urine microscopic-add on  Result Value Ref Range   Squamous Epithelial / LPF 0-5 (A) NONE SEEN   WBC, UA 0-5 0 - 5 WBC/hpf   RBC / HPF 0-5 0 - 5 RBC/hpf   Bacteria, UA RARE (A) NONE SEEN   Dg Chest 2 View Result Date: 10/31/2015 CLINICAL DATA:  Worsening shortness of breath and productive cough for 3-4 days. COPD. EXAM: CHEST  2 VIEW COMPARISON:  09/26/2014. FINDINGS: The heart size and mediastinal contours are within normal limits. Both lungs are clear. The visualized skeletal structures are unremarkable. Chronic emphysematous changes. Minimal blunting RIGHT CP angle stable. IMPRESSION: COPD.  No active disease. Electronically Signed   By: Staci Righter M.D.   On: 10/31/2015 14:36    1515:   On arrival: pt sitting upright, tachypneic, Sats 98 % O2 3L N/C, lungs diminished with wheezing. IV solumedrol and hour long neb started. After neb: pt appears more comfortable at rest, less tachypneic, Sats 100  % on O2 3L N/C, lungs continue diminished. Pt ambulated in hallway and back to stretcher with pt's O2 Sats dropping to 85 % on O2 3L N/C with pt c/o increasing SOB, with increasing HR and RR. Pt escorted back to stretcher with O2 Sats slowly increasing to 97 % on O2 3L N/C. Dx and testing d/w pt and family.  Questions answered.  Verb understanding, agreeable to admit.   1535:  T/C to [facility]. Candiss Norse,  case discussed, including:  HPI, pertinent PM/SHx, VS/PE, dx testing, ED course and treatment:  Agreeable to admit, requests to write temporary orders, obtain medical bed to team APAdmits.     Final Clinical Impressions(s) / ED Diagnoses   Final diagnoses:  None    New Prescriptions New Prescriptions   No medications on file      Francine Graven, DO 11/01/15 1400

## 2015-10-31 NOTE — ED Notes (Signed)
Attempted to call report, RN unavailable at this time.

## 2015-10-31 NOTE — ED Triage Notes (Signed)
Pt reports sob for past few days with episode of cp in left chest radiating to left arm. Pt also has non-productive cough, on 4L 02 [place] at home. Pt alert and oriented at this time.

## 2015-10-31 NOTE — ED Notes (Signed)
Pt reports he wears 2L O2 via [place] when at home resting, but uses 3L O2 via [place] upon exertion normally. Pt reports he has been having to use 4L of O2 due to the SOB today.

## 2015-11-01 LAB — CBC
HEMATOCRIT: 40.5 % (ref 39.0–52.0)
HEMOGLOBIN: 13.5 g/dL (ref 13.0–17.0)
MCH: 30.7 pg (ref 26.0–34.0)
MCHC: 33.3 g/dL (ref 30.0–36.0)
MCV: 92 fL (ref 78.0–100.0)
Platelets: 272 10*3/uL (ref 150–400)
RBC: 4.4 MIL/uL (ref 4.22–5.81)
RDW: 12.7 % (ref 11.5–15.5)
WBC: 2.6 10*3/uL — ABNORMAL LOW (ref 4.0–10.5)

## 2015-11-01 LAB — BASIC METABOLIC PANEL
Anion gap: 9 (ref 5–15)
BUN: 17 mg/dL (ref 6–20)
CHLORIDE: 101 mmol/L (ref 101–111)
CO2: 26 mmol/L (ref 22–32)
Calcium: 9.2 mg/dL (ref 8.9–10.3)
Creatinine, Ser: 0.89 mg/dL (ref 0.61–1.24)
GFR calc Af Amer: >60 mL/min (ref >60)
GFR calc non Af Amer: >60 mL/min (ref >60)
GLUCOSE: 151 mg/dL — AB (ref 65–99)
POTASSIUM: 3.9 mmol/L (ref 3.5–5.1)
Sodium: 136 mmol/L (ref 135–145)

## 2015-11-01 NOTE — Progress Notes (Signed)
PROGRESS NOTE                                                                                                                                                                                                             Patient Demographics:    Robert [name], is a 60 y.o. male, DOB - Dec 07, 1955, EVO:[id number]  Admit date - 10/31/2015   Admitting Physician Thurnell Lose, MD  Outpatient Primary MD for the patient is CLAGGETT,ELIN, PA-C  LOS - 1  Chief Complaint  Patient presents with  . Shortness of Breath       Brief Narrative   Robert [name]  is a 60 y.o. male, With history of COPD on 2 L nasal cannula oxygen pulmonologist in [place], ongoing smoking counseled to quit, bipolar disorder, who's been having problems with a dry cough and gradually progressive shortness of breath and wheezing for the last 2-3 days comes to the hospital with these complaints, he was diagnosed with COPD exacerbation and admitted to the hospital.   Subjective:    [place] today has, No headache, No chest pain, No abdominal pain - No Nausea, No new weakness tingling or numbness, Improved Cough - SOB.    Assessment  & Plan :    1. Acute on chronic hypoxic respiratory failure secondary to COPD exacerbation. On exam patient seems to have advanced COPD at baseline, he was moving minimal air bilaterallyOn admission which has improved, wheezing has gone down as well, he has been strictly counseled to quit smoking, for now continue IV Solu-Medrol, oral Levaquin, scheduled and as needed neb treatments along with oxygen as needed, continue flutter valve for pulmonary toiletry. If improvement continues discharged tomorrow or in 48 hours with outpatient follow-up with his PCP and pulmonologist in [place].   2. Ongoing smoking. Counseled to quit.   3. Bipolar disorder, PTSD. Both stable continue home medications  unchanged.    Family Communication  :  Daughter yesterday  Code Status :  Full  Diet : Heart Healthy  Disposition Plan  :  Home in 1-2 days  Consults  :  None  Procedures  :  None  DVT Prophylaxis  :  Lovenox   Lab Results  Component Value Date   PLT 272 11/01/2015    Inpatient Medications  Scheduled Meds: . docusate sodium  200  mg Oral BID  . enoxaparin (LOVENOX) injection  40 mg Subcutaneous Q24H  . haloperidol  0.5 mg Oral QHS  . Influenza vac split quadrivalent PF  0.5 mL Intramuscular Tomorrow-1000  . ipratropium-albuterol  3 mL Nebulization Q6H  . levofloxacin  500 mg Oral Daily  . lithium carbonate  300 mg Oral BID WC  . methylPREDNISolone (SOLU-MEDROL) injection  80 mg Intravenous TID  . mometasone-formoterol  2 puff Inhalation BID   Continuous Infusions:  PRN Meds:.acetaminophen, albuterol, guaiFENesin-dextromethorphan, HYDROcodone-acetaminophen, magnesium citrate, ondansetron (ZOFRAN) IV  Antibiotics  :    Anti-infectives    Start     Dose/Rate Route Frequency Ordered Stop   10/31/15 1700  levofloxacin (LEVAQUIN) tablet 500 mg     500 mg Oral Daily 10/31/15 1548           Objective:   Vitals:   10/31/15 2117 11/01/15 0253 11/01/15 0500 11/01/15 0725  BP:      Pulse:   92   Resp:   18   Temp:   97.4 F (36.3 C)   TempSrc:   Oral   SpO2: 94% 98% 96% 95%  Weight:   81.1 kg (178 lb 14.4 oz)   Height:        Wt Readings from Last 3 Encounters:  11/01/15 81.1 kg (178 lb 14.4 oz)  11/20/14 83.9 kg (185 lb)  09/26/14 79.4 kg (175 lb)    No intake or output data in the 24 hours ending 11/01/15 0819   Physical Exam  Awake Alert, Oriented X 3, No new F.N deficits, Normal affect [place].AT,PERRAL Supple Neck,No JVD, No cervical lymphadenopathy appriciated.  Symmetrical Chest wall movement, Mod air movement bilaterally, mild wheezing RRR,No Gallops,Rubs or new Murmurs, No Parasternal Heave +ve B.Sounds, Abd Soft, No tenderness, No organomegaly  appriciated, No rebound - guarding or rigidity. No Cyanosis, Clubbing or edema, No new Rash or bruise       Data Review:    CBC  Recent Labs Lab 10/31/15 1218 11/01/15 0545  WBC 3.5* 2.6*  HGB 13.4 13.5  HCT 40.8 40.5  PLT 231 272  MCV 93.6 92.0  MCH 30.7 30.7  MCHC 32.8 33.3  RDW 12.9 12.7    Chemistries   Recent Labs Lab 10/31/15 1218 11/01/15 0545  NA 135 136  K 4.0 3.9  CL 99* 101  CO2 29 26  GLUCOSE 97 151*  BUN 10 17  CREATININE 0.79 0.89  CALCIUM 9.3 9.2   ------------------------------------------------------------------------------------------------------------------ No results for input(s): CHOL, HDL, LDLCALC, TRIG, CHOLHDL, LDLDIRECT in the last 72 hours.  No results found for: HGBA1C ------------------------------------------------------------------------------------------------------------------ No results for input(s): TSH, T4TOTAL, T3FREE, THYROIDAB in the last 72 hours.  Invalid input(s): FREET3 ------------------------------------------------------------------------------------------------------------------ No results for input(s): VITAMINB12, FOLATE, FERRITIN, TIBC, IRON, RETICCTPCT in the last 72 hours.  Coagulation profile No results for input(s): INR, PROTIME in the last 168 hours.  No results for input(s): DDIMER in the last 72 hours.  Cardiac Enzymes  Recent Labs Lab 10/31/15 1218  TROPONINI <0.03   ------------------------------------------------------------------------------------------------------------------ No results found for: BNP  Micro Results No results found for this or any previous visit (from the past 240 hour(s)).  Radiology Reports Dg Chest 2 View  Result Date: 10/31/2015 CLINICAL DATA:  Worsening shortness of breath and productive cough for 3-4 days. COPD. EXAM: CHEST  2 VIEW COMPARISON:  09/26/2014. FINDINGS: The heart size and mediastinal contours are within normal limits. Both lungs are clear. The  visualized skeletal structures are unremarkable. Chronic emphysematous  changes. Minimal blunting RIGHT CP angle stable. IMPRESSION: COPD.  No active disease. Electronically Signed   By: Staci Righter M.D.   On: 10/31/2015 14:36    Time Spent in minutes  30   SINGH,PRASHANT K M.D on 11/01/2015 at 8:19 AM  Between 7am to 7pm - Pager - [phone]  After 7pm go to [email or web address] - password [facility]  [facility] -  Office  [phone]

## 2015-11-01 NOTE — Care Management Note (Signed)
Case Management Note  Patient Details  Name: Robert [name] MRN: [id number] Date of Birth: 01-21-1955  Subjective/Objective:                  Pt admitted with COPD. He is from home, lives with his daughter and is ind with ADL's. He has PCP, drives himself to appointments and manages his own medications. He has insurance with drug coverage and has no difficulty obtaining his prescriptions. He voices his desire to find a new PCP and things he needs from a PCP. He was provided with list of PCP's accepting new pts and encouraged to call and discuss his needs with the offices to determine which PCP would work best for him. Pt has supplemental oxygen he wears at home PTA. He also has neb machine. Pt does not receive any HH services PTA.   Action/Plan: No further CM needs anticipated.   Expected Discharge Date:    11/02/2015              Expected Discharge Plan:  Home/Self Care  In-House Referral:  NA  Discharge planning Services  CM Consult  Post Acute Care Choice:  NA Choice offered to:  NA  Status of Service:  Completed, signed off  Sherald Barge, RN 11/01/2015, 2:31 PM

## 2015-11-02 DIAGNOSIS — E7800 Pure hypercholesterolemia, unspecified: Secondary | ICD-10-CM

## 2015-11-02 DIAGNOSIS — F319 Bipolar disorder, unspecified: Secondary | ICD-10-CM

## 2015-11-02 DIAGNOSIS — J410 Simple chronic bronchitis: Secondary | ICD-10-CM

## 2015-11-02 DIAGNOSIS — F4310 Post-traumatic stress disorder, unspecified: Secondary | ICD-10-CM

## 2015-11-02 DIAGNOSIS — J441 Chronic obstructive pulmonary disease with (acute) exacerbation: Principal | ICD-10-CM

## 2015-11-02 MED ORDER — PREDNISONE 10 MG (21) PO TBPK: ORAL_TABLET | ORAL | 0 refills | Status: DC

## 2015-11-02 MED ORDER — LEVOFLOXACIN 750 MG PO TABS: 750.0000 mg | ORAL_TABLET | Freq: Every day | ORAL | 0 refills | Status: DC

## 2015-11-02 MED ORDER — GUAIFENESIN ER 600 MG PO TB12: 1200.0000 mg | ORAL_TABLET | Freq: Two times a day (BID) | ORAL | 0 refills | Status: DC

## 2015-11-02 NOTE — Progress Notes (Signed)
Patient with orders to be discharge home. Discharge instructions given, patient verbalized understanding. Patient stable. Patient left in private vehicle.

## 2015-11-02 NOTE — Care Management Important Message (Signed)
Important Message  Patient Details  Name: Robert [name] MRN: [id number] Date of Birth: 1955/06/26   Medicare Important Message Given:  N/A - LOS <3 / Initial given by admissions    Sherald Barge, RN 11/02/2015, 10:16 AM

## 2015-11-02 NOTE — Discharge Summary (Signed)
Physician Discharge Summary  Robert [name]:[id number] DOB: Jan 20, 1955 DOA: 10/31/2015  PCP: [name]  Admit date: 10/31/2015 Discharge date: 11/02/2015  Admitted From: Home Disposition: Home  Recommendations for Outpatient Follow-up:  1. Follow up with PCP in 1-2 weeks 2. Please obtain BMP/CBC in one week  Home Health: No Equipment/Devices: Already has O2 at home  Discharge Condition: Stable CODE STATUS: Full Code Diet recommendation: Heart Healthy  Brief/Interim Summary: Robert [name]  is a 60 y.o. male, With history of COPD on 2 L nasal cannula oxygen pulmonologist in [place], ongoing smoking counseled to quit, bipolar disorder, who's been having problems with a dry cough and gradually progressive shortness of breath and wheezing for the last 2-3 days comes to the hospital with these complaints. In the ER diagnosed with acute on chronic COPD exacerbation with hypoxia and I was called to admit the patient.  Eyes any fever or chills, no exposure to sick contacts, no recent travel, some chest and abdominal wall pain associated with coughing episodes, no diarrhea, no dysuria, no blood in stool or urine or focal weakness. No history is resistive of orthopnea or weight gain.  Discharge Diagnoses:  Principal Problem:   COPD (chronic obstructive pulmonary disease) (HCC) Active Problems:   High cholesterol   PTSD (post-traumatic stress disorder)   Bipolar 1 disorder (HCC)   COPD exacerbation (HCC)   Acute on chronic hypoxic respiratory failure secondary to COPD exacerbation  -Patient admitted to with dry cough, progressive SOB and wheezing. -Had hypoxia oxygen with oxygen saturations 85%. -This is likely secondary to acute COPD exacerbation, placed on oral levofloxacin was started on IV Solu-Medrol. -Supportive management with bronchodilators, mucolytics and antitussives initiated. -Patient improved, felt better. -On discharge levofloxacin for 5 more  days and prednisone taper/Mucinex.  Tobacco abuse -Ongoing tobacco abuse, patient reported that he smokes only about 3 cigarettes per day. -Counseled to stop smoking.  Bipolar disorder, PTSD -Both stable continue home medications unchanged.   Discharge Instructions  Discharge Instructions    Diet - low sodium heart healthy    Complete by:  As directed    Increase activity slowly    Complete by:  As directed        Medication List    TAKE these medications   budesonide-formoterol 160-4.5 MCG/ACT inhaler Commonly known as:  SYMBICORT Inhale 2 puffs into the lungs 2 (two) times daily.   CLEAR EYES OP Place 2 drops into both eyes daily as needed (red eyes).   diphenhydrAMINE 25 mg capsule Commonly known as:  BENADRYL Take 25 mg by mouth at bedtime.   guaiFENesin 600 MG 12 hr tablet Commonly known as:  MUCINEX Take 2 tablets (1,200 mg total) by mouth 2 (two) times daily.   haloperidol 0.5 MG tablet Commonly known as:  HALDOL Take 0.5 mg by mouth at bedtime.   ibuprofen 200 MG tablet Commonly known as:  ADVIL,MOTRIN Take 400 mg by mouth daily as needed for headache.   ipratropium-albuterol 0.5-2.5 (3) MG/3ML Soln Commonly known as:  DUONEB Take 3 mLs by nebulization every 6 (six) hours as needed (shortness of breath).   levofloxacin 750 MG tablet Commonly known as:  LEVAQUIN Take 1 tablet (750 mg total) by mouth daily. Start taking on:  11/03/2015   lithium carbonate 300 MG capsule Take 300 mg by mouth 2 (two) times daily with a meal.   predniSONE 10 MG (21) Tbpk tablet Commonly known as:  STERAPRED UNI-PAK 21 TAB Take 6-5-4-3-2-1 PO daily till gone  PROVENTIL HFA 108 (90 Base) MCG/ACT inhaler Generic drug:  albuterol Inhale 2 puffs into the lungs every 6 (six) hours as needed for wheezing or shortness of breath.   sodium chloride 0.65 % Soln nasal spray Commonly known as:  OCEAN Place 1 spray into both nostrils as needed for congestion.       No  Known Allergies  Consultations:  None   Procedures/Studies: Dg Chest 2 View  Result Date: 10/31/2015 CLINICAL DATA:  Worsening shortness of breath and productive cough for 3-4 days. COPD. EXAM: CHEST  2 VIEW COMPARISON:  09/26/2014. FINDINGS: The heart size and mediastinal contours are within normal limits. Both lungs are clear. The visualized skeletal structures are unremarkable. Chronic emphysematous changes. Minimal blunting RIGHT CP angle stable. IMPRESSION: COPD.  No active disease. Electronically Signed   By: Staci Righter M.D.   On: 10/31/2015 14:36    (Echo, Carotid, EGD, Colonoscopy, ERCP)    Subjective:   Discharge Exam: Vitals:   11/01/15 2034 11/02/15 0534  BP: 120/62 115/63  Pulse: 72 96  Resp: 20 20  Temp: 97.5 F (36.4 C) 97.4 F (36.3 C)   Vitals:   11/02/15 0200 11/02/15 0534 11/02/15 0749 11/02/15 0754  BP:  115/63    Pulse:  96    Resp:  20    Temp:  97.4 F (36.3 C)    TempSrc:  Oral    SpO2: 98% 98% 97% 98%  Weight:  81.1 kg (178 lb 11.2 oz)    Height:        General: Pt is alert, awake, not in acute distress Cardiovascular: RRR, S1/S2 +, no rubs, no gallops Respiratory: CTA bilaterally, no wheezing, no rhonchi Abdominal: Soft, NT, ND, bowel sounds + Extremities: no edema, no cyanosis    The results of significant diagnostics from this hospitalization (including imaging, microbiology, ancillary and laboratory) are listed below for reference.     Microbiology: No results found for this or any previous visit (from the past 240 hour(s)).   Labs: BNP (last 3 results) No results for input(s): BNP in the last 8760 hours. Basic Metabolic Panel:  Recent Labs Lab 10/31/15 1218 11/01/15 0545  NA 135 136  K 4.0 3.9  CL 99* 101  CO2 29 26  GLUCOSE 97 151*  BUN 10 17  CREATININE 0.79 0.89  CALCIUM 9.3 9.2   Liver Function Tests: No results for input(s): AST, ALT, ALKPHOS, BILITOT, PROT, ALBUMIN in the last 168 hours. No results for  input(s): LIPASE, AMYLASE in the last 168 hours. No results for input(s): AMMONIA in the last 168 hours. CBC:  Recent Labs Lab 10/31/15 1218 11/01/15 0545  WBC 3.5* 2.6*  HGB 13.4 13.5  HCT 40.8 40.5  MCV 93.6 92.0  PLT 231 272   Cardiac Enzymes:  Recent Labs Lab 10/31/15 1218  TROPONINI <0.03   BNP: Invalid input(s): POCBNP CBG: No results for input(s): GLUCAP in the last 168 hours. D-Dimer No results for input(s): DDIMER in the last 72 hours. Hgb A1c No results for input(s): HGBA1C in the last 72 hours. Lipid Profile No results for input(s): CHOL, HDL, LDLCALC, TRIG, CHOLHDL, LDLDIRECT in the last 72 hours. Thyroid function studies No results for input(s): TSH, T4TOTAL, T3FREE, THYROIDAB in the last 72 hours.  Invalid input(s): FREET3 Anemia work up No results for input(s): VITAMINB12, FOLATE, FERRITIN, TIBC, IRON, RETICCTPCT in the last 72 hours. Urinalysis    Component Value Date/Time   COLORURINE YELLOW 10/31/2015 [place]  10/31/2015 1253   APPEARANCEUR Clear 11/14/2013 1034   LABSPEC 1.010 10/31/2015 1253   LABSPEC 1.003 11/14/2013 1034   PHURINE 5.5 10/31/2015 [place] 10/31/2015 1253   GLUCOSEU Negative 11/14/2013 1034   HGBUR SMALL (A) 10/31/2015 1253   BILIRUBINUR NEGATIVE 10/31/2015 1253   BILIRUBINUR Negative 11/14/2013 [place] 10/31/2015 1253   PROTEINUR NEGATIVE 10/31/2015 1253   NITRITE NEGATIVE 10/31/2015 1253   LEUKOCYTESUR NEGATIVE 10/31/2015 1253   LEUKOCYTESUR Negative 11/14/2013 1034   Sepsis Labs Invalid input(s): PROCALCITONIN,  WBC,  LACTICIDVEN Microbiology No results found for this or any previous visit (from the past 240 hour(s)).   Time coordinating discharge: Over 30 minutes  SIGNED:   Birdie Hopes, MD  [facility] 11/02/2015, 10:15 AM Pager   If 7PM-7AM, please contact night-coverage [email or web address] Password [email or web address]

## 2015-11-09 IMAGING — DX DG CHEST 2V
2 series · 2 of 2 positions shown · non-contrast
Comparison: 02/24/2014

CLINICAL DATA: Shortness of breath and productive cough.

EXAM:
CHEST  2 VIEW

[chest pa]
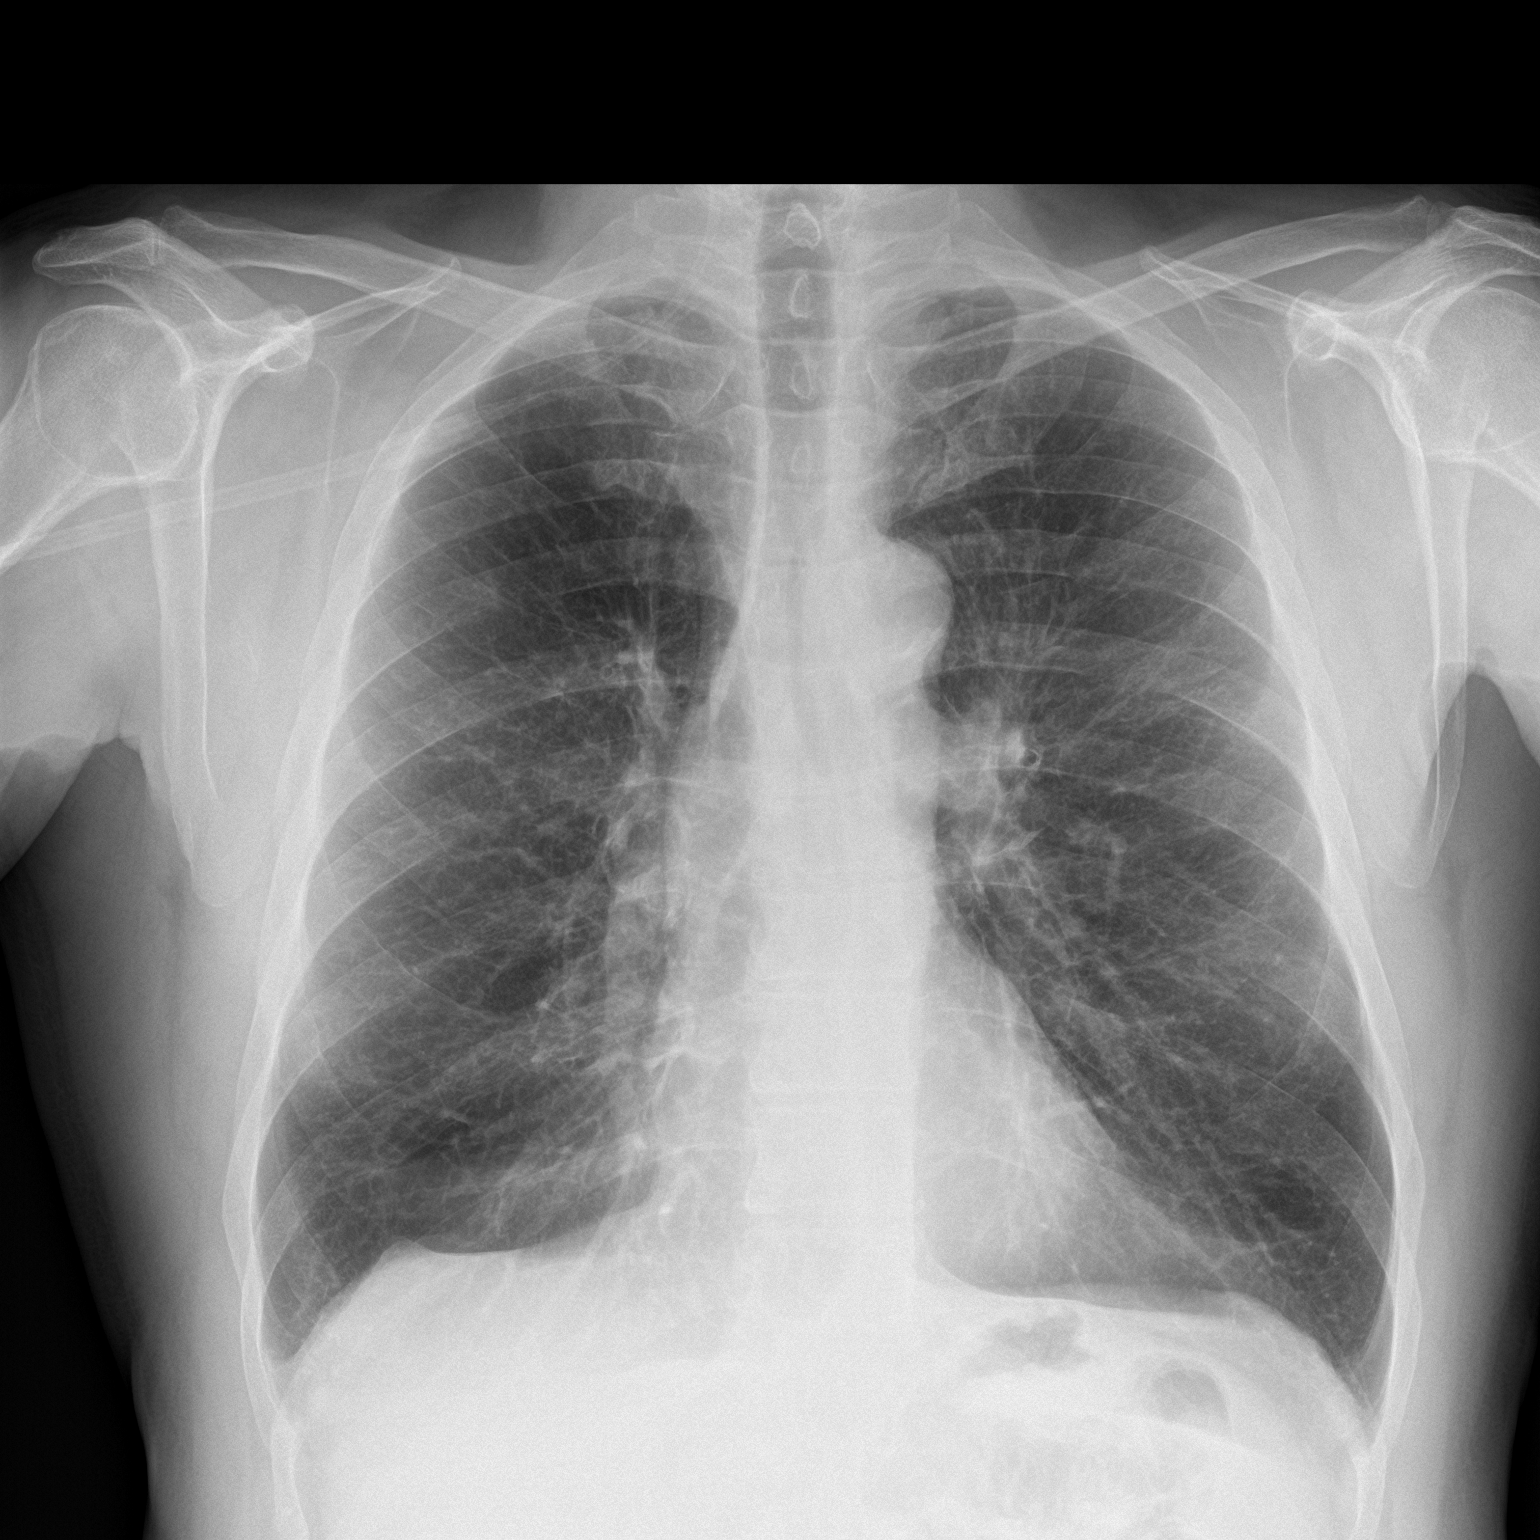

[chest lat]
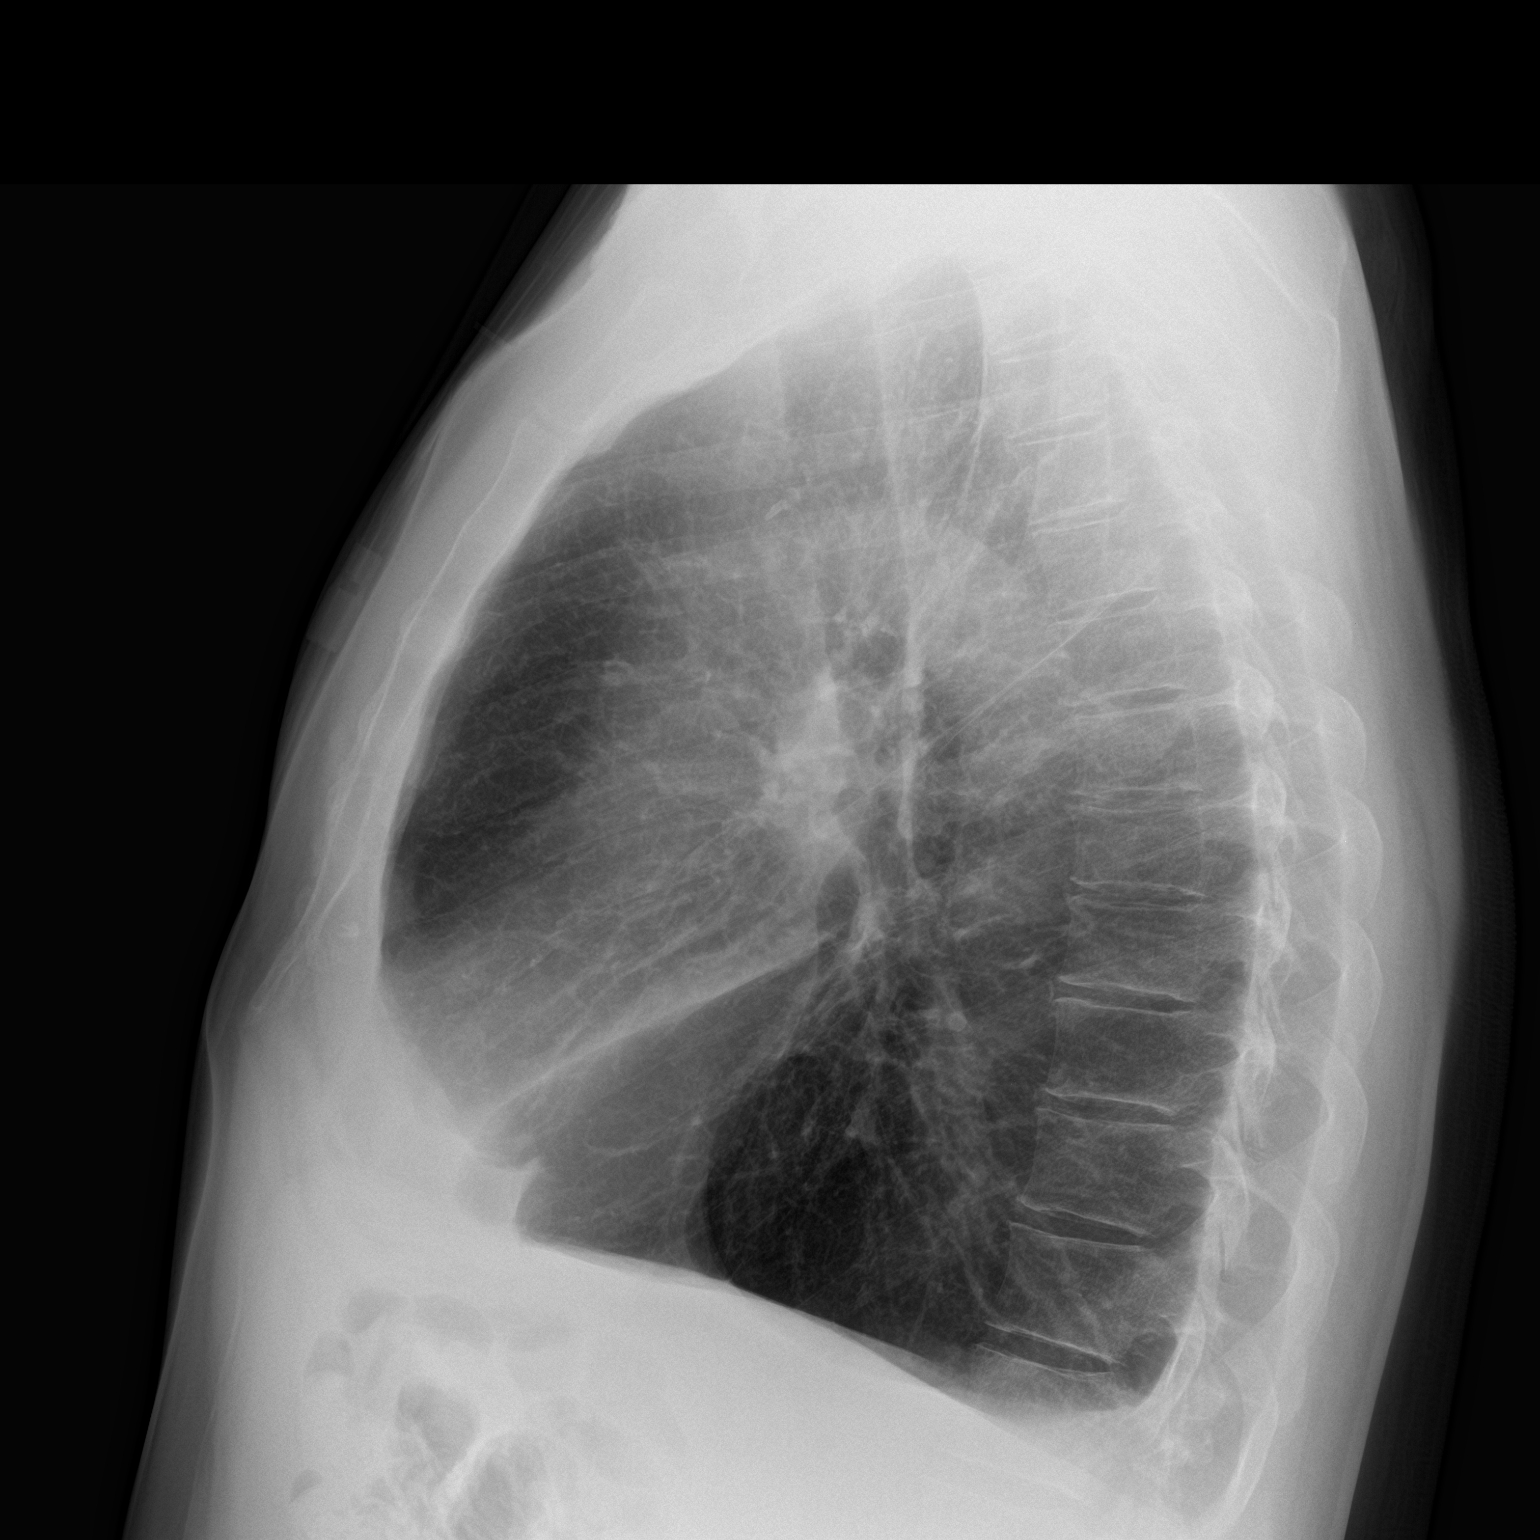

[2 of 2 positions shown; findings below may reference images not displayed]

FINDINGS: Cardiomediastinal silhouette is normal. Mediastinal contours appear
intact.

There is no evidence of focal airspace consolidation or
pneumothorax. There is a small right pleural effusion. Emphysematous
changes are again seen.

Osseous structures are without acute abnormality. Soft tissues are
grossly normal.
IMPRESSION: Small right pleural effusion.

Chronic emphysematous changes.

## 2016-02-16 ENCOUNTER — Emergency Department (HOSPITAL_COMMUNITY): Payer: Medicare Other

## 2016-02-16 ENCOUNTER — Observation Stay (HOSPITAL_COMMUNITY)
Admission: EM | Admit: 2016-02-16 | Discharge: 2016-02-17 | Disposition: A | Discharge status: Home / Self Care | Payer: Medicare Other | Attending: Internal Medicine | Admitting: Internal Medicine

## 2016-02-16 ENCOUNTER — Encounter (HOSPITAL_COMMUNITY): Payer: Self-pay | Admitting: Emergency Medicine

## 2016-02-16 DIAGNOSIS — I639 Cerebral infarction, unspecified: Secondary | ICD-10-CM | POA: Diagnosis not present

## 2016-02-16 DIAGNOSIS — Z79899 Other long term (current) drug therapy: Secondary | ICD-10-CM | POA: Diagnosis not present

## 2016-02-16 DIAGNOSIS — J441 Chronic obstructive pulmonary disease with (acute) exacerbation: Secondary | ICD-10-CM | POA: Diagnosis present

## 2016-02-16 DIAGNOSIS — G459 Transient cerebral ischemic attack, unspecified: Secondary | ICD-10-CM | POA: Diagnosis not present

## 2016-02-16 DIAGNOSIS — F17210 Nicotine dependence, cigarettes, uncomplicated: Secondary | ICD-10-CM | POA: Diagnosis not present

## 2016-02-16 DIAGNOSIS — F319 Bipolar disorder, unspecified: Secondary | ICD-10-CM | POA: Diagnosis not present

## 2016-02-16 DIAGNOSIS — J449 Chronic obstructive pulmonary disease, unspecified: Secondary | ICD-10-CM | POA: Diagnosis not present

## 2016-02-16 DIAGNOSIS — R531 Weakness: Secondary | ICD-10-CM | POA: Diagnosis present

## 2016-02-16 DIAGNOSIS — M6281 Muscle weakness (generalized): Secondary | ICD-10-CM

## 2016-02-16 LAB — URINALYSIS, ROUTINE W REFLEX MICROSCOPIC
BILIRUBIN URINE: NEGATIVE
Bacteria, UA: NONE SEEN
GLUCOSE, UA: NEGATIVE mg/dL
Ketones, ur: NEGATIVE mg/dL
Leukocytes, UA: NEGATIVE
Nitrite: NEGATIVE
PROTEIN: NEGATIVE mg/dL
Specific Gravity, Urine: 1.009 (ref 1.005–1.030)
pH: 6 (ref 5.0–8.0)

## 2016-02-16 LAB — COMPREHENSIVE METABOLIC PANEL
ALBUMIN: 4.3 g/dL (ref 3.5–5.0)
ALK PHOS: 47 U/L (ref 38–126)
ALT: 21 U/L (ref 17–63)
AST: 22 U/L (ref 15–41)
Anion gap: 6 (ref 5–15)
BILIRUBIN TOTAL: 0.5 mg/dL (ref 0.3–1.2)
BUN: 24 mg/dL — AB (ref 6–20)
CALCIUM: 9 mg/dL (ref 8.9–10.3)
CO2: 29 mmol/L (ref 22–32)
CREATININE: 1 mg/dL (ref 0.61–1.24)
Chloride: 103 mmol/L (ref 101–111)
GFR calc Af Amer: >60 mL/min (ref >60)
GLUCOSE: 107 mg/dL — AB (ref 65–99)
Potassium: 4.2 mmol/L (ref 3.5–5.1)
Sodium: 138 mmol/L (ref 135–145)
Total Protein: 7.3 g/dL (ref 6.5–8.1)

## 2016-02-16 LAB — RAPID URINE DRUG SCREEN, HOSP PERFORMED
Amphetamines: NOT DETECTED
BARBITURATES: NOT DETECTED
Benzodiazepines: NOT DETECTED
COCAINE: NOT DETECTED
Opiates: NOT DETECTED
TETRAHYDROCANNABINOL: NOT DETECTED

## 2016-02-16 LAB — CBC
HEMATOCRIT: 39.9 % (ref 39.0–52.0)
HEMOGLOBIN: 13.2 g/dL (ref 13.0–17.0)
MCH: 31.2 pg (ref 26.0–34.0)
MCHC: 33.1 g/dL (ref 30.0–36.0)
MCV: 94.3 fL (ref 78.0–100.0)
Platelets: 273 10*3/uL (ref 150–400)
RBC: 4.23 MIL/uL (ref 4.22–5.81)
RDW: 12.1 % (ref 11.5–15.5)
WBC: 7.2 10*3/uL (ref 4.0–10.5)

## 2016-02-16 LAB — I-STAT CHEM 8, ED
BUN: 23 mg/dL — AB (ref 6–20)
CREATININE: 1.1 mg/dL (ref 0.61–1.24)
Calcium, Ion: 1.18 mmol/L (ref 1.15–1.40)
Chloride: 101 mmol/L (ref 101–111)
GLUCOSE: 106 mg/dL — AB (ref 65–99)
HCT: 40 % (ref 39.0–52.0)
HEMOGLOBIN: 13.6 g/dL (ref 13.0–17.0)
Potassium: 4 mmol/L (ref 3.5–5.1)
Sodium: 139 mmol/L (ref 135–145)
TCO2: 30 mmol/L (ref 0–100)

## 2016-02-16 LAB — DIFFERENTIAL
Basophils Absolute: 0 10*3/uL (ref 0.0–0.1)
Basophils Relative: 0 %
EOS ABS: 0.1 10*3/uL (ref 0.0–0.7)
Eosinophils Relative: 2 %
LYMPHS ABS: 2 10*3/uL (ref 0.7–4.0)
LYMPHS PCT: 28 %
MONOS PCT: 10 %
Monocytes Absolute: 0.8 10*3/uL (ref 0.1–1.0)
NEUTROS ABS: 4.3 10*3/uL (ref 1.7–7.7)
Neutrophils Relative %: 60 %

## 2016-02-16 LAB — APTT: aPTT: 29 seconds (ref 24–36)

## 2016-02-16 LAB — I-STAT TROPONIN, ED: TROPONIN I, POC: 0.01 ng/mL (ref 0.00–0.08)

## 2016-02-16 LAB — ETHANOL: Alcohol, Ethyl (B): <5 mg/dL (ref <5)

## 2016-02-16 LAB — PROTIME-INR
INR: 1.02
Prothrombin Time: 13.4 seconds (ref 11.4–15.2)

## 2016-02-16 MED ORDER — ALBUTEROL SULFATE (2.5 MG/3ML) 0.083% IN NEBU
2.5000 mg | INHALATION_SOLUTION | Freq: Once | RESPIRATORY_TRACT | Status: AC
  Administered 2016-02-16: 2.5 mg via RESPIRATORY_TRACT
  Filled 2016-02-16: qty 3

## 2016-02-16 MED ORDER — IPRATROPIUM-ALBUTEROL 0.5-2.5 (3) MG/3ML IN SOLN
3.0000 mL | Freq: Once | RESPIRATORY_TRACT | Status: AC
  Administered 2016-02-16: 3 mL via RESPIRATORY_TRACT
  Filled 2016-02-16: qty 3

## 2016-02-16 MED ORDER — ASPIRIN 325 MG PO TABS: 325.0000 mg | ORAL_TABLET | Freq: Every day | ORAL | Status: DC

## 2016-02-16 MED ORDER — ASPIRIN 325 MG PO TABS
325.0000 mg | ORAL_TABLET | Freq: Every day | ORAL | Status: DC
  Administered 2016-02-16: 325 mg via ORAL
  Filled 2016-02-16: qty 1

## 2016-02-16 MED ORDER — IOPAMIDOL (ISOVUE-370) INJECTION 76%
100.0000 mL | Freq: Once | INTRAVENOUS | Status: AC | PRN
  Administered 2016-02-16: 100 mL via INTRAVENOUS

## 2016-02-16 NOTE — Progress Notes (Signed)
2023 CALL FROM ER TO DIAGNOSTIC, CODE STROKE COMING TO CT 2029 BEEPER 2030 PATIENT IN CT 2035 EXAM ENDED/ IMAGES SENT TO SOC/ EXAM COMPLETED IN EPIC/ 2037 CALLED RHONDA AT [facility]  SOC CALLED TWICE, ONCE WHILE PATIENT HAD NOT GOT TO CT YET, WANTING CT SCAN. CALLED AGAIN WHEN PATIENT WAS ON TABLE, LOOKING FOR A RECENT MRI SCAN DONE IN THE SYSTEM. I CANNOT FIND A MRI SCAN IN PACS OR EPIC/ TSF

## 2016-02-16 NOTE — ED Notes (Signed)
Pt transported to CT ?

## 2016-02-16 NOTE — H&P (Addendum)
TRH H&P    Patient Demographics:    Robert [name], is a 61 y.o. male  MRN: [id number]  DOB - [birth year]  Admit Date - 02/16/2016  Referring MD/NP/PA: Dr. Roderic Palau  Outpatient Primary MD for the patient is [facility], PA-C  Patient coming from: Home  Chief Complaint  Patient presents with  . Code Stroke      HPI:    Robert [name]  is a 60 y.o. male, With history of COPD on chronic home O2, bipolar disorder, depression came to hospital with right sided weakness which started when patient was cooking at home. Patient says that more than weakness he experienced unsteadiness on his feet with some weakness on right side. Weakness has seemed to resolved at this time. Code stroke was called initially and patient refused TPA. CT head and CT angiogram of neck did not show any significant abnormality. Patient also complaining of shortness of breath, denies coughing up phlegm. No chest pain. He denies nausea vomiting or diarrhea. No fever or dysuria. No previous history of stroke or seizures.    Review of systems:    In addition to the HPI above,  No Fever-chills, No Headache, No changes with Vision or hearing, No problems swallowing food or Liquids, No Abdominal pain, No Nausea or Vomiting, bowel movements are regular, No Blood in stool or Urine, No dysuria, No new skin rashes or bruises, No new joints pains-aches,  No recent weight gain or loss, No polyuria, polydypsia or polyphagia, No significant Mental Stressors.  A full 10 point Review of Systems was done, except as stated above, all other Review of Systems were negative.   With Past History of the following :    Past Medical History:  Diagnosis Date  . Alcohol addiction ([place])   . Anxiety   . Bipolar 1 disorder ([place])   . COPD (chronic obstructive pulmonary disease) ([place])   . Depression   . High cholesterol   . Panic   . PTSD  (post-traumatic stress disorder)       Past Surgical History:  Procedure Laterality Date  . TONSILLECTOMY        Social History:      Social History  Substance Use Topics  . Smoking status: Current Every Day Smoker    Packs/day: 0.50    Years: 45.00    Types: Cigarettes  . Smokeless tobacco: Never Used  . Alcohol use Yes     Comment: heavily       Family History :     Family History  Problem Relation Age of Onset  . Coronary artery disease Mother   . Coronary artery disease Father       Home Medications:   Prior to Admission medications   Medication Sig Start Date End Date Taking? Authorizing Provider  albuterol (PROVENTIL HFA) 108 (90 BASE) MCG/ACT inhaler Inhale 2 puffs into the lungs every 6 (six) hours as needed for wheezing or shortness of breath.   Yes Historical Provider, MD  budesonide-formoterol (SYMBICORT) 160-4.5 MCG/ACT inhaler Inhale 2 puffs into the lungs  2 (two) times daily.   Yes Historical Provider, MD  diphenhydrAMINE (BENADRYL) 25 mg capsule Take 25 mg by mouth at bedtime.    Yes Historical Provider, MD  ipratropium-albuterol (DUONEB) 0.5-2.5 (3) MG/3ML SOLN Take 3 mLs by nebulization every 6 (six) hours as needed (shortness of breath).    Yes Historical Provider, MD  Naphazoline HCl (CLEAR EYES OP) Place 2 drops into both eyes daily as needed (red eyes).   Yes Historical Provider, MD  sodium chloride (OCEAN) 0.65 % SOLN nasal spray Place 1 spray into both nostrils as needed for congestion. 11/20/14  Yes Forde Dandy, MD  haloperidol (HALDOL) 0.5 MG tablet Take 0.5 mg by mouth at bedtime.  11/18/13   Historical Provider, MD  lithium carbonate 300 MG capsule Take 300 mg by mouth 2 (two) times daily with a meal.    Historical Provider, MD     Allergies:    No Known Allergies   Physical Exam:   Vitals  Blood pressure 131/80, pulse 88, temperature 98.7 F (37.1 C), resp. rate 15, height '5\' 8"'$  (1.727 m), weight 86.2 kg (190 lb), SpO2 97 %.  1.   General: Appears in mild respiratory distress  2. Psychiatric:  Intact judgement and  insight, awake alert, oriented x 3.  3. Neurologic: No focal neurological deficits, all cranial nerves intact.Strength 5/5 all 4 extremities, sensation intact all 4 extremities, plantars down going.  4. Eyes :  anicteric sclerae, moist conjunctivae with no lid lag. PERRLA.  5. ENMT:  Oropharynx clear with moist mucous membranes and good dentition  6. Neck:  supple, no cervical lymphadenopathy appriciated, No thyromegaly  7. Respiratory : Bilateral wheezing  8. Cardiovascular : RRR, no gallops, rubs or murmurs, no leg edema  9. Gastrointestinal:  Positive bowel sounds, abdomen soft, non-tender to palpation,no hepatosplenomegaly, no rigidity or guarding       10. Skin:  No cyanosis, normal texture and turgor, no rash, lesions or ulcers      Data Review:    CBC  Recent Labs Lab 02/16/16 2028 02/16/16 2110  WBC 7.2  --   HGB 13.2 13.6  HCT 39.9 40.0  PLT 273  --   MCV 94.3  --   MCH 31.2  --   MCHC 33.1  --   RDW 12.1  --   LYMPHSABS 2.0  --   MONOABS 0.8  --   EOSABS 0.1  --   BASOSABS 0.0  --    ------------------------------------------------------------------------------------------------------------------  Chemistries   Recent Labs Lab 02/16/16 2028 02/16/16 2110  NA 138 139  K 4.2 4.0  CL 103 101  CO2 29  --   GLUCOSE 107* 106*  BUN 24* 23*  CREATININE 1.00 1.10  CALCIUM 9.0  --   AST 22  --   ALT 21  --   ALKPHOS 47  --   BILITOT 0.5  --    ------------------------------------------------------------------------------------------------------------------  ------------------------------------------------------------------------------------------------------------------ GFR: Estimated Creatinine Clearance: 75.3 mL/min (by C-G formula based on SCr of 1.1 mg/dL). Liver Function Tests:  Recent Labs Lab 02/16/16 2028  AST 22  ALT 21  ALKPHOS 47    BILITOT 0.5  PROT 7.3  ALBUMIN 4.3   No results for input(s): LIPASE, AMYLASE in the last 168 hours. No results for input(s): AMMONIA in the last 168 hours. Coagulation Profile:  Recent Labs Lab 02/16/16 2028  INR 1.02    --------------------------------------------------------------------------------------------------------------- Urine analysis:    Component Value Date/Time   COLORURINE STRAW (A) 02/16/2016 2023  APPEARANCEUR CLEAR 02/16/2016 2023   APPEARANCEUR Clear 11/14/2013 1034   LABSPEC 1.009 02/16/2016 2023   LABSPEC 1.003 11/14/2013 1034   PHURINE 6.0 02/16/2016 2023   GLUCOSEU NEGATIVE 02/16/2016 2023   GLUCOSEU Negative 11/14/2013 1034   HGBUR SMALL (A) 02/16/2016 2023   BILIRUBINUR NEGATIVE 02/16/2016 2023   BILIRUBINUR Negative 11/14/2013 [place] 02/16/2016 2023   PROTEINUR NEGATIVE 02/16/2016 2023   NITRITE NEGATIVE 02/16/2016 2023   LEUKOCYTESUR NEGATIVE 02/16/2016 2023   LEUKOCYTESUR Negative 11/14/2013 1034      Imaging Results:    Ct Angio Head W Or Wo Contrast  Result Date: 02/16/2016 CLINICAL DATA:  Extremity weakness while cooking 1800 hours, RIGHT greater than LEFT. No focal neurological deficit. History of hypercholesterolemia, COPD, alcohol addiction. EXAM: CT ANGIOGRAPHY HEAD AND NECK TECHNIQUE: Multidetector CT imaging of the head and neck was performed using the standard protocol during bolus administration of intravenous contrast. Multiplanar CT image reconstructions and MIPs were obtained to evaluate the vascular anatomy. Carotid stenosis measurements (when applicable) are obtained utilizing NASCET criteria, using the distal internal carotid diameter as the denominator. CONTRAST:  100 cc Isovue 370 COMPARISON:  CT HEAD February 16, 2016 at 2029 hours FINDINGS: CTA NECK- moderate motion degraded examination. AORTIC ARCH: Normal appearance of the thoracic arch, normal branch pattern. Mild calcific atherosclerosis of the aortic  arch. The origins of the innominate, left Common carotid artery and subclavian artery are widely patent. RIGHT CAROTID SYSTEM: Common carotid artery is widely patent, coursing in a straight line fashion. Patent bifurcation without flow limiting stenosis, limited assessment due to motion. Mild calcific atherosclerosis . Normal appearance of the included internal carotid artery. LEFT CAROTID SYSTEM: Common carotid artery is widely patent, coursing in a straight line fashion. Patent carotid bifurcation without flow limiting stenosis, limited assessment due to motion. Mild calcific atherosclerosis. Normal appearance of the included internal carotid artery. VERTEBRAL ARTERIES:Codominant vertebral arteries. Normal appearance of the vertebral arteries, which appear widely patent. SKELETON: No acute osseous process though bone windows have not been submitted. Poor dentition. Severe C5-6 and C6-7 degenerative discs resulting in moderate to severe LEFT C6-7 neural foraminal narrowing. OTHER NECK: Soft tissues of the neck are non-acute though, not tailored for evaluation. Included lung apices demonstrates severe centrilobular emphysema. CTA HEAD- delayed phase. ANTERIOR CIRCULATION: Normal appearance of the cervical internal carotid arteries, petrous, cavernous and supra clinoid internal carotid arteries. Widely patent anterior communicating artery. Patent bilateral middle cerebral artery with tandem moderate stenoses. Patent anterior cerebral arteries. No large vessel occlusion, dissection, contrast extravasation or aneurysm. POSTERIOR CIRCULATION: Normal appearance of the vertebral arteries, vertebrobasilar junction and basilar artery, as well as main branch vessels. Patent posterior cerebral arteries with moderate tandem stenoses. No large vessel occlusion, dissection, contrast extravasation or aneurysm. VENOUS SINUSES: Major dural venous sinuses are patent though not tailored for evaluation on this angiographic  examination. ANATOMIC VARIANTS: None. DELAYED PHASE: No abnormal intracranial enhancement. IMPRESSION: CTA NECK: Moderately motion degraded examination limits assessment of the carotid bifurcations. No flow limiting stenosis. Severe emphysema. CTA HEAD:  No emergent large vessel occlusion or severe stenosis. Moderate tandem stenosis of the MCA and PCA vessels compatible with atherosclerosis. Electronically Signed   By: Elon Alas M.D.   On: 02/16/2016 22:11   Ct Head Wo Contrast  Result Date: 02/16/2016 CLINICAL DATA:  Code stroke.  Diffuse extremity weakness EXAM: CT HEAD WITHOUT CONTRAST TECHNIQUE: Contiguous axial images were obtained from the base of the skull through the vertex without intravenous contrast.  COMPARISON:  None. FINDINGS: Brain: No mass lesion, intraparenchymal hemorrhage or extra-axial collection. No evidence of acute cortical infarct. Brain parenchyma and CSF-containing spaces are normal for age. Relative hypoattenuation of the anterior left temporal lobe on axial images is favored to be artifactual. The basal ganglia are preserved. The insular cortices are normal. Vascular: No hyperdense vessel or unexpected calcification. Skull: Normal visualized skull base, calvarium and extracranial soft tissues. Sinuses/Orbits: No sinus fluid levels or advanced mucosal thickening. No mastoid effusion. Normal orbits. ASPECTS [facility] Stroke Program Early CT Score) - Ganglionic level infarction (caudate, lentiform nuclei, internal capsule, insula, M1-M3 cortex): 7 - Supraganglionic infarction (M4-M6 cortex): 3 Total score (0-10 with 10 being normal): 10 IMPRESSION: 1. No acute intracranial abnormality. 2. ASPECTS is 10. These results were called by telephone at the time of interpretation on 02/16/2016 at 8:46 pm to Dr. Milton Ferguson , who verbally acknowledged these results. Electronically Signed   By: Ulyses Jarred M.D.   On: 02/16/2016 20:46   Ct Angio Neck W And/or Wo Contrast  Result Date:  02/16/2016 CLINICAL DATA:  Extremity weakness while cooking 1800 hours, RIGHT greater than LEFT. No focal neurological deficit. History of hypercholesterolemia, COPD, alcohol addiction. EXAM: CT ANGIOGRAPHY HEAD AND NECK TECHNIQUE: Multidetector CT imaging of the head and neck was performed using the standard protocol during bolus administration of intravenous contrast. Multiplanar CT image reconstructions and MIPs were obtained to evaluate the vascular anatomy. Carotid stenosis measurements (when applicable) are obtained utilizing NASCET criteria, using the distal internal carotid diameter as the denominator. CONTRAST:  100 cc Isovue 370 COMPARISON:  CT HEAD February 16, 2016 at 2029 hours FINDINGS: CTA NECK- moderate motion degraded examination. AORTIC ARCH: Normal appearance of the thoracic arch, normal branch pattern. Mild calcific atherosclerosis of the aortic arch. The origins of the innominate, left Common carotid artery and subclavian artery are widely patent. RIGHT CAROTID SYSTEM: Common carotid artery is widely patent, coursing in a straight line fashion. Patent bifurcation without flow limiting stenosis, limited assessment due to motion. Mild calcific atherosclerosis . Normal appearance of the included internal carotid artery. LEFT CAROTID SYSTEM: Common carotid artery is widely patent, coursing in a straight line fashion. Patent carotid bifurcation without flow limiting stenosis, limited assessment due to motion. Mild calcific atherosclerosis. Normal appearance of the included internal carotid artery. VERTEBRAL ARTERIES:Codominant vertebral arteries. Normal appearance of the vertebral arteries, which appear widely patent. SKELETON: No acute osseous process though bone windows have not been submitted. Poor dentition. Severe C5-6 and C6-7 degenerative discs resulting in moderate to severe LEFT C6-7 neural foraminal narrowing. OTHER NECK: Soft tissues of the neck are non-acute though, not tailored for  evaluation. Included lung apices demonstrates severe centrilobular emphysema. CTA HEAD- delayed phase. ANTERIOR CIRCULATION: Normal appearance of the cervical internal carotid arteries, petrous, cavernous and supra clinoid internal carotid arteries. Widely patent anterior communicating artery. Patent bilateral middle cerebral artery with tandem moderate stenoses. Patent anterior cerebral arteries. No large vessel occlusion, dissection, contrast extravasation or aneurysm. POSTERIOR CIRCULATION: Normal appearance of the vertebral arteries, vertebrobasilar junction and basilar artery, as well as main branch vessels. Patent posterior cerebral arteries with moderate tandem stenoses. No large vessel occlusion, dissection, contrast extravasation or aneurysm. VENOUS SINUSES: Major dural venous sinuses are patent though not tailored for evaluation on this angiographic examination. ANATOMIC VARIANTS: None. DELAYED PHASE: No abnormal intracranial enhancement. IMPRESSION: CTA NECK: Moderately motion degraded examination limits assessment of the carotid bifurcations. No flow limiting stenosis. Severe emphysema. CTA HEAD:  No emergent large vessel  occlusion or severe stenosis. Moderate tandem stenosis of the MCA and PCA vessels compatible with atherosclerosis. Electronically Signed   By: Elon Alas M.D.   On: 02/16/2016 22:11   Dg Chest Portable 1 View  Result Date: 02/16/2016 CLINICAL DATA:  Acute onset extremity weakness at 1800 hours. History of COPD. EXAM: PORTABLE CHEST 1 VIEW COMPARISON:  Chest radiograph October 31, 2015 FINDINGS: Cardiomediastinal silhouette is normal. Mildly calcified aortic knob. No pleural effusions or focal consolidations. Mild chronic interstitial changes with increased lung volumes, flattened hemidiaphragms. Trachea projects midline and there is no pneumothorax. Soft tissue planes and included osseous structures are non-suspicious. IMPRESSION: Stable examination: COPD, no acute  cardiopulmonary process. Electronically Signed   By: Elon Alas M.D.   On: 02/16/2016 21:21    My personal review of EKG: Rhythm NSR   Assessment & Plan:    Active Problems:   Bipolar 1 disorder (HCC)   COPD exacerbation ([facility])   TIA (transient ischemic attack)   1. TIA versus stroke- patient for TIA workup, will obtain MRI/MRA brain in a.m. CT angiogram of neck showed no abnormality. Will check lipid profile, hemoglobin A1c in a.m. Start aspirin 325 mg by mouth daily.Will check echocardiogram in a.m. he will checks every 4 hours. 2. COPD exacerbation- patient also presenting with COPD exacerbation, start Solu-Medrol 60 milli grams IV every 6 hours, DuoNeb nebs every 6 hours. 3. Bipolar disorder- continue lithium 300 mg daily   DVT Prophylaxis-   Lovenox   AM Labs Ordered, also please review Full Orders  Family Communication: Admission, patients condition and plan of care including tests being ordered have been discussed with the patient  who indicate understanding and agree with the plan and Code Status.  Code Status: Full code  Admission status: Observation    Time spent in minutes : 60 minutes   Ellar Hakala S M.D on 02/16/2016 at 11:13 PM  Between 7am to 7pm - Pager - [phone]. After 7pm go to [email or web address] - password [facility]  [facility] - Office  [phone]

## 2016-02-16 NOTE — ED Triage Notes (Signed)
Pt states he was cooking at 1800 and felt weak in all of his extremities.  After attempting to walk pt states it feels like weakness was more on right side than left.  No deficits noted in grip strength, facial asymmetry, or leg strength

## 2016-02-16 NOTE — ED Provider Notes (Signed)
[facility] DEPT Provider Note   CSN: [id number] Arrival date & time: 02/16/16  2000   An emergency department physician performed an initial assessment on this suspected stroke patient at 2019.  History   Chief Complaint Chief Complaint  Patient presents with  . Code Stroke    HPI Robert [name] is a 61 y.o. male.  Patient states at 6:00 he started having weakness in his right arm and right leg.  Patient now feels like it's improving.    Weakness  Primary symptoms include focal weakness. This is a new problem. The current episode started 1 to 2 hours ago. The problem has been rapidly improving. There was right upper extremity and right lower extremity focality noted. There has been no fever. Pertinent negatives include no shortness of breath, no chest pain and no headaches. There were no medications administered prior to arrival. Associated medical issues do not include trauma.    Past Medical History:  Diagnosis Date  . Alcohol addiction ([place])   . Anxiety   . Bipolar 1 disorder ([place])   . COPD (chronic obstructive pulmonary disease) ([place])   . Depression   . High cholesterol   . Panic   . PTSD (post-traumatic stress disorder)     Patient Active Problem List   Diagnosis Date Noted  . COPD exacerbation ([place]) 10/31/2015  . COPD (chronic obstructive pulmonary disease) ([place])   . High cholesterol   . PTSD (post-traumatic stress disorder)   . Alcohol addiction ([place])   . Bipolar 1 disorder [facility])     Past Surgical History:  Procedure Laterality Date  . TONSILLECTOMY         Home Medications    Prior to Admission medications   Medication Sig Start Date End Date Taking? Authorizing Provider  albuterol (PROVENTIL HFA) 108 (90 BASE) MCG/ACT inhaler Inhale 2 puffs into the lungs every 6 (six) hours as needed for wheezing or shortness of breath.   Yes Historical Provider, MD  budesonide-formoterol (SYMBICORT) 160-4.5 MCG/ACT inhaler Inhale 2 puffs into the lungs 2  (two) times daily.   Yes Historical Provider, MD  diphenhydrAMINE (BENADRYL) 25 mg capsule Take 25 mg by mouth at bedtime.    Yes Historical Provider, MD  ipratropium-albuterol (DUONEB) 0.5-2.5 (3) MG/3ML SOLN Take 3 mLs by nebulization every 6 (six) hours as needed (shortness of breath).    Yes Historical Provider, MD  Naphazoline HCl (CLEAR EYES OP) Place 2 drops into both eyes daily as needed (red eyes).   Yes Historical Provider, MD  sodium chloride (OCEAN) 0.65 % SOLN nasal spray Place 1 spray into both nostrils as needed for congestion. 11/20/14  Yes Forde Dandy, MD  haloperidol (HALDOL) 0.5 MG tablet Take 0.5 mg by mouth at bedtime.  11/18/13   Historical Provider, MD  lithium carbonate 300 MG capsule Take 300 mg by mouth 2 (two) times daily with a meal.    Historical Provider, MD    Family History Family History  Problem Relation Age of Onset  . Coronary artery disease Mother   . Coronary artery disease Father     Social History Social History  Substance Use Topics  . Smoking status: Current Every Day Smoker    Packs/day: 0.50    Years: 45.00    Types: Cigarettes  . Smokeless tobacco: Never Used  . Alcohol use Yes     Comment: heavily     Allergies   Patient has no known allergies.   Review of Systems Review of Systems  Constitutional: Negative for appetite change and fatigue.  HENT: Negative for congestion, ear discharge and sinus pressure.   Eyes: Negative for discharge.  Respiratory: Negative for cough and shortness of breath.   Cardiovascular: Negative for chest pain.  Gastrointestinal: Negative for abdominal pain and diarrhea.  Genitourinary: Negative for frequency and hematuria.  Musculoskeletal: Negative for back pain.  Skin: Negative for rash.  Neurological: Positive for focal weakness and weakness. Negative for seizures and headaches.  Psychiatric/Behavioral: Negative for hallucinations.     Physical Exam Updated Vital Signs BP 148/82   Pulse 90    Temp 98.7 F (37.1 C)   Resp 20   Ht '5\' 8"'$  (1.727 m)   Wt 190 lb (86.2 kg)   SpO2 99%   BMI 28.89 kg/m   Physical Exam  Constitutional: He is oriented to person, place, and time. He appears well-developed.  HENT:  Head: Normocephalic.  Eyes: Conjunctivae and EOM are normal. No scleral icterus.  Neck: Neck supple. No thyromegaly present.  Cardiovascular: Normal rate and regular rhythm.  Exam reveals no gallop and no friction rub.   No murmur heard. Pulmonary/Chest: No stridor. He has no wheezes. He has no rales. He exhibits no tenderness.  Abdominal: He exhibits no distension. There is no tenderness. There is no rebound.  Musculoskeletal: Normal range of motion. He exhibits no edema.  Lymphadenopathy:    He has no cervical adenopathy.  Neurological: He is oriented to person, place, and time. He exhibits normal muscle tone. Coordination abnormal.  Patient has a mild decrease in coordination of his right arm and his right leg  Skin: No rash noted. No erythema.  Psychiatric: He has a normal mood and affect. His behavior is normal.     ED Treatments / Results  Labs (all labs ordered are listed, but only abnormal results are displayed) Labs Reviewed  COMPREHENSIVE METABOLIC PANEL - Abnormal; Notable for the following:       Result Value   Glucose, Bld 107 (*)    BUN 24 (*)    All other components within normal limits  I-STAT CHEM 8, ED - Abnormal; Notable for the following:    BUN 23 (*)    Glucose, Bld 106 (*)    All other components within normal limits  ETHANOL  PROTIME-INR  APTT  CBC  DIFFERENTIAL  RAPID URINE DRUG SCREEN, HOSP PERFORMED  URINALYSIS, ROUTINE W REFLEX MICROSCOPIC  I-STAT TROPOININ, ED    EKG  EKG Interpretation None       Radiology Ct Head Wo Contrast  Result Date: 02/16/2016 CLINICAL DATA:  Code stroke.  Diffuse extremity weakness EXAM: CT HEAD WITHOUT CONTRAST TECHNIQUE: Contiguous axial images were obtained from the base of the skull  through the vertex without intravenous contrast. COMPARISON:  None. FINDINGS: Brain: No mass lesion, intraparenchymal hemorrhage or extra-axial collection. No evidence of acute cortical infarct. Brain parenchyma and CSF-containing spaces are normal for age. Relative hypoattenuation of the anterior left temporal lobe on axial images is favored to be artifactual. The basal ganglia are preserved. The insular cortices are normal. Vascular: No hyperdense vessel or unexpected calcification. Skull: Normal visualized skull base, calvarium and extracranial soft tissues. Sinuses/Orbits: No sinus fluid levels or advanced mucosal thickening. No mastoid effusion. Normal orbits. ASPECTS [facility] Stroke Program Early CT Score) - Ganglionic level infarction (caudate, lentiform nuclei, internal capsule, insula, M1-M3 cortex): 7 - Supraganglionic infarction (M4-M6 cortex): 3 Total score (0-10 with 10 being normal): 10 IMPRESSION: 1. No acute intracranial abnormality. 2. ASPECTS  is 10. These results were called by telephone at the time of interpretation on 02/16/2016 at 8:46 pm to Dr. Milton Ferguson , who verbally acknowledged these results. Electronically Signed   By: Ulyses Jarred M.D.   On: 02/16/2016 20:46   Dg Chest Portable 1 View  Result Date: 02/16/2016 CLINICAL DATA:  Acute onset extremity weakness at 1800 hours. History of COPD. EXAM: PORTABLE CHEST 1 VIEW COMPARISON:  Chest radiograph October 31, 2015 FINDINGS: Cardiomediastinal silhouette is normal. Mildly calcified aortic knob. No pleural effusions or focal consolidations. Mild chronic interstitial changes with increased lung volumes, flattened hemidiaphragms. Trachea projects midline and there is no pneumothorax. Soft tissue planes and included osseous structures are non-suspicious. IMPRESSION: Stable examination: COPD, no acute cardiopulmonary process. Electronically Signed   By: Elon Alas M.D.   On: 02/16/2016 21:21    Procedures Procedures (including  critical care time)  Medications Ordered in ED Medications  ipratropium-albuterol (DUONEB) 0.5-2.5 (3) MG/3ML nebulizer solution 3 mL (not administered)  albuterol (PROVENTIL) (2.5 MG/3ML) 0.083% nebulizer solution 2.5 mg (not administered)  iopamidol (ISOVUE-370) 76 % injection 100 mL (100 mLs Intravenous Contrast Given 02/16/16 2143)     Initial Impression / Assessment and Plan / ED Course  I have reviewed the triage vital signs and the nursing notes.  Pertinent labs & imaging results that were available during my care of the patient were reviewed by me and considered in my medical decision making (see chart for details).     CRITICAL CARE Performed by: Cythnia Osmun,Kyley L Total critical care time:19mnutes Critical care time was exclusive of separately billable procedures and treating other patients. Critical care was necessary to treat or prevent imminent or life-threatening deterioration. Critical care was time spent personally by me on the following activities: development of treatment plan with patient and/or surrogate as well as nursing, discussions with consultants, evaluation of patient's response to treatment, examination of patient, obtaining history from patient or surrogate, ordering and performing treatments and interventions, ordering and review of laboratory studies, ordering and review of radiographic studies, pulse oximetry and re-evaluation of patient's condition.   Final Clinical Impressions(s) / ED Diagnoses   Final diagnoses:  None  Patient declined TPA after speaking with neurologist.  He will be admitted for stroke workup  New Prescriptions New Prescriptions   No medications on file     JMilton Ferguson MD 02/16/16 2214

## 2016-02-17 ENCOUNTER — Observation Stay (HOSPITAL_BASED_OUTPATIENT_CLINIC_OR_DEPARTMENT_OTHER): Payer: Medicare Other

## 2016-02-17 ENCOUNTER — Observation Stay (HOSPITAL_COMMUNITY): Payer: Medicare Other

## 2016-02-17 DIAGNOSIS — G458 Other transient cerebral ischemic attacks and related syndromes: Secondary | ICD-10-CM

## 2016-02-17 DIAGNOSIS — I639 Cerebral infarction, unspecified: Secondary | ICD-10-CM

## 2016-02-17 LAB — ECHOCARDIOGRAM COMPLETE
AV Area VTI: 1.46 cm2
AV Peak grad: 16 mmHg
AV VEL mean LVOT/AV: 0.58
AV peak Index: 0.72
AVAREAMEANV: 1.46 cm2
AVAREAMEANVIN: 0.72 cm2/m2
AVAREAVTIIND: 0.63 cm2/m2
AVG: 9 mmHg
AVLVOTPG: 5 mmHg
AVPKVEL: 203 cm/s
Ao pk vel: 0.58 m/s
CHL CUP AV VALUE AREA INDEX: 0.63
CHL CUP AV VEL: 1.28
CHL CUP STROKE VOLUME: 39 mL
DOP CAL AO MEAN VELOCITY: 141 cm/s
EWDT: 109 ms
FS: 43 % (ref 28–44)
HEIGHTINCHES: 68 in
IV/PV OW: 1.03
LA diam end sys: 32 mm
LA diam index: 1.58 cm/m2
LA vol: 37.2 mL
LASIZE: 32 mm
LAVOLA4C: 29.3 mL
LAVOLIN: 18.3 mL/m2
LV dias vol index: 32 mL/m2
LV sys vol: 27 mL (ref 21–61)
LVDIAVOL: 65 mL (ref 62–150)
LVOT VTI: 22.8 cm
LVOT area: 2.54 cm2
LVOT diameter: 18 mm
LVOTPV: 117 cm/s
LVOTSV: 58 mL
LVOTVTI: 0.51 cm
LVSYSVOLIN: 13 mL/m2
MV Dec: 109
MV Peak grad: 6 mmHg
MV pk E vel: 118 m/s
PW: 10.1 mm — AB (ref 0.6–1.1)
Simpson's disk: 59
TAPSE: 18.7 mm
VTI: 45.1 cm
Valve area: 1.28 cm2
WEIGHTICAEL: 2971.2 [oz_av]

## 2016-02-17 LAB — LIPID PANEL
Cholesterol: 229 mg/dL — ABNORMAL HIGH (ref 0–200)
HDL: 76 mg/dL (ref >40)
LDL CALC: 148 mg/dL — AB (ref 0–99)
Total CHOL/HDL Ratio: 3 RATIO
Triglycerides: 26 mg/dL (ref <150)
VLDL: 5 mg/dL (ref 0–40)

## 2016-02-17 MED ORDER — SENNOSIDES-DOCUSATE SODIUM 8.6-50 MG PO TABS: 1.0000 | ORAL_TABLET | Freq: Every evening | ORAL | Status: DC | PRN

## 2016-02-17 MED ORDER — STROKE: EARLY STAGES OF RECOVERY BOOK
Freq: Once | Status: DC
  Filled 2016-02-17: qty 1

## 2016-02-17 MED ORDER — IPRATROPIUM-ALBUTEROL 0.5-2.5 (3) MG/3ML IN SOLN
3.0000 mL | Freq: Four times a day (QID) | RESPIRATORY_TRACT | Status: DC
  Administered 2016-02-17 (×2): 3 mL via RESPIRATORY_TRACT
  Filled 2016-02-17 (×2): qty 3

## 2016-02-17 MED ORDER — HALOPERIDOL 0.5 MG PO TABS
0.5000 mg | ORAL_TABLET | Freq: Every day | ORAL | Status: DC
  Filled 2016-02-17 (×4): qty 1

## 2016-02-17 MED ORDER — ACETAMINOPHEN 325 MG PO TABS: 650.0000 mg | ORAL_TABLET | ORAL | Status: DC | PRN

## 2016-02-17 MED ORDER — HALOPERIDOL 0.5 MG PO TABS: 0.5000 mg | ORAL_TABLET | Freq: Every day | ORAL | 1 refills | Status: DC

## 2016-02-17 MED ORDER — ASPIRIN 325 MG PO TABS
325.0000 mg | ORAL_TABLET | Freq: Every day | ORAL | Status: DC
  Administered 2016-02-17: 325 mg via ORAL
  Filled 2016-02-17: qty 1

## 2016-02-17 MED ORDER — ASPIRIN EC 81 MG PO TBEC: 81.0000 mg | DELAYED_RELEASE_TABLET | Freq: Every day | ORAL | Status: DC

## 2016-02-17 MED ORDER — LITHIUM CARBONATE 150 MG PO CAPS
300.0000 mg | ORAL_CAPSULE | Freq: Two times a day (BID) | ORAL | Status: DC
  Administered 2016-02-17 (×2): 300 mg via ORAL
  Filled 2016-02-17 (×2): qty 2

## 2016-02-17 MED ORDER — ENOXAPARIN SODIUM 40 MG/0.4ML ~~LOC~~ SOLN
40.0000 mg | SUBCUTANEOUS | Status: DC
Start: 2016-02-17 — End: 2016-02-17
  Administered 2016-02-17: 40 mg via SUBCUTANEOUS
  Filled 2016-02-17: qty 0.4

## 2016-02-17 MED ORDER — ACETAMINOPHEN 650 MG RE SUPP: 650.0000 mg | RECTAL | Status: DC | PRN

## 2016-02-17 MED ORDER — LITHIUM CARBONATE 300 MG PO CAPS: 300.0000 mg | ORAL_CAPSULE | Freq: Two times a day (BID) | ORAL | 1 refills | Status: DC

## 2016-02-17 MED ORDER — SODIUM CHLORIDE 0.9 % IV SOLN: INTRAVENOUS | Status: DC

## 2016-02-17 MED ORDER — ORAL CARE MOUTH RINSE: 15.0000 mL | Freq: Two times a day (BID) | OROMUCOSAL | Status: DC

## 2016-02-17 MED ORDER — METHYLPREDNISOLONE SODIUM SUCC 125 MG IJ SOLR
60.0000 mg | Freq: Four times a day (QID) | INTRAMUSCULAR | Status: DC
  Administered 2016-02-17 (×3): 60 mg via INTRAVENOUS
  Filled 2016-02-17 (×3): qty 2

## 2016-02-17 MED ORDER — IPRATROPIUM-ALBUTEROL 0.5-2.5 (3) MG/3ML IN SOLN
3.0000 mL | RESPIRATORY_TRACT | Status: DC
  Administered 2016-02-17 (×2): 3 mL via RESPIRATORY_TRACT
  Filled 2016-02-17 (×2): qty 3

## 2016-02-17 MED ORDER — ACETAMINOPHEN 160 MG/5ML PO SOLN: 650.0000 mg | ORAL | Status: DC | PRN

## 2016-02-17 NOTE — Care Management Obs Status (Signed)
[facility] NOTIFICATION   Patient Details  Name: Robert [name] MRN: [id number] Date of Birth: June 18, 1955   Medicare Observation Status Notification Given:  Yes    Sherald Barge, RN 02/17/2016, 1:45 PM

## 2016-02-17 NOTE — Clinical Social Work Note (Signed)
CSW provided list of outpatient psychiatrists. Pt states he needs someone to prescribe medication. No other needs reported. CSW signing off.  Benay Pike, [place]

## 2016-02-17 NOTE — Discharge Summary (Signed)
Physician Discharge Summary  Robert [name] UTM:[id number] DOB: 1955-08-03 DOA: 02/16/2016  PCP: Geroge [name]  Admit date: 02/16/2016 Discharge date: 02/17/2016  Time spent: 45 minutes  Recommendations for Outpatient Follow-up:  -To be discharged home today. -Advised to follow-up with primary care provider in 2 weeks.   Discharge Diagnoses:  Active Problems:   Bipolar 1 disorder (HCC)   COPD exacerbation ([place])   TIA (transient ischemic attack)   Discharge Condition: Stable and improved  Filed Weights   02/16/16 2008 02/17/16 0030  Weight: 86.2 kg (190 lb) 84.2 kg (185 lb 11.2 oz)    History of present illness:  As per Dr. Darrick Meigs on 2/8: Robert [name]  is a 61 y.o. male, With history of COPD on chronic home O2, bipolar disorder, depression came to hospital with right sided weakness which started when patient was cooking at home. Patient says that more than weakness he experienced unsteadiness on his feet with some weakness on right side. Weakness has seemed to resolved at this time. Code stroke was called initially and patient refused TPA. CT head and CT angiogram of neck did not show any significant abnormality. Patient also complaining of shortness of breath, denies coughing up phlegm. No chest pain. He denies nausea vomiting or diarrhea. No fever or dysuria. No previous history of stroke or seizures.  Hospital Course:   TIA -Right sided symptoms have fully resolved at time of discharge. -MRI negative. -Placed on daily aspirin. -Seen by PT/OT/ST with recommendations for discharge home without follow-up. -Echo: Left ventricular ejection fraction of 55-60% with grade 1 diastolic dysfunction, no wall motion abnormalities, no obvious PFO.  Rest of chronic conditions have been stable in this less than 24-hour long hospitalization  Procedures:  As above   Consultations:  None  Discharge Instructions  Discharge Instructions    Diet - low sodium heart  healthy    Complete by:  As directed    Increase activity slowly    Complete by:  As directed      Allergies as of 02/17/2016   No Known Allergies     Medication List    TAKE these medications   aspirin EC 81 MG tablet Take 1 tablet (81 mg total) by mouth daily.   budesonide-formoterol 160-4.5 MCG/ACT inhaler Commonly known as:  SYMBICORT Inhale 2 puffs into the lungs 2 (two) times daily.   CLEAR EYES OP Place 2 drops into both eyes daily as needed (red eyes).   diphenhydrAMINE 25 mg capsule Commonly known as:  BENADRYL Take 25 mg by mouth at bedtime.   haloperidol 0.5 MG tablet Commonly known as:  HALDOL Take 1 tablet (0.5 mg total) by mouth at bedtime.   ipratropium-albuterol 0.5-2.5 (3) MG/3ML Soln Commonly known as:  DUONEB Take 3 mLs by nebulization every 6 (six) hours as needed (shortness of breath).   lithium carbonate 300 MG capsule Take 1 capsule (300 mg total) by mouth 2 (two) times daily with a meal.   PROVENTIL HFA 108 (90 Base) MCG/ACT inhaler Generic drug:  albuterol Inhale 2 puffs into the lungs every 6 (six) hours as needed for wheezing or shortness of breath.   sodium chloride 0.65 % Soln nasal spray Commonly known as:  OCEAN Place 1 spray into both nostrils as needed for congestion.      No Known Allergies Follow-up Information    CLAGGETT,ELIN, PA-C. Schedule an appointment as soon as possible for a visit in 2 week(s).   Specialty:  Family Medicine Contact  information: [address] [place] [address] [phone]            The results of significant diagnostics from this hospitalization (including imaging, microbiology, ancillary and laboratory) are listed below for reference.    Significant Diagnostic Studies: Ct Angio Head W Or Wo Contrast  Result Date: 02/16/2016 CLINICAL DATA:  Extremity weakness while cooking 1800 hours, RIGHT greater than LEFT. No focal neurological deficit. History of hypercholesterolemia, COPD,  alcohol addiction. EXAM: CT ANGIOGRAPHY HEAD AND NECK TECHNIQUE: Multidetector CT imaging of the head and neck was performed using the standard protocol during bolus administration of intravenous contrast. Multiplanar CT image reconstructions and MIPs were obtained to evaluate the vascular anatomy. Carotid stenosis measurements (when applicable) are obtained utilizing NASCET criteria, using the distal internal carotid diameter as the denominator. CONTRAST:  100 cc Isovue 370 COMPARISON:  CT HEAD February 16, 2016 at 2029 hours FINDINGS: CTA NECK- moderate motion degraded examination. AORTIC ARCH: Normal appearance of the thoracic arch, normal branch pattern. Mild calcific atherosclerosis of the aortic arch. The origins of the innominate, left Common carotid artery and subclavian artery are widely patent. RIGHT CAROTID SYSTEM: Common carotid artery is widely patent, coursing in a straight line fashion. Patent bifurcation without flow limiting stenosis, limited assessment due to motion. Mild calcific atherosclerosis . Normal appearance of the included internal carotid artery. LEFT CAROTID SYSTEM: Common carotid artery is widely patent, coursing in a straight line fashion. Patent carotid bifurcation without flow limiting stenosis, limited assessment due to motion. Mild calcific atherosclerosis. Normal appearance of the included internal carotid artery. VERTEBRAL ARTERIES:Codominant vertebral arteries. Normal appearance of the vertebral arteries, which appear widely patent. SKELETON: No acute osseous process though bone windows have not been submitted. Poor dentition. Severe C5-6 and C6-7 degenerative discs resulting in moderate to severe LEFT C6-7 neural foraminal narrowing. OTHER NECK: Soft tissues of the neck are non-acute though, not tailored for evaluation. Included lung apices demonstrates severe centrilobular emphysema. CTA HEAD- delayed phase. ANTERIOR CIRCULATION: Normal appearance of the cervical internal  carotid arteries, petrous, cavernous and supra clinoid internal carotid arteries. Widely patent anterior communicating artery. Patent bilateral middle cerebral artery with tandem moderate stenoses. Patent anterior cerebral arteries. No large vessel occlusion, dissection, contrast extravasation or aneurysm. POSTERIOR CIRCULATION: Normal appearance of the vertebral arteries, vertebrobasilar junction and basilar artery, as well as main branch vessels. Patent posterior cerebral arteries with moderate tandem stenoses. No large vessel occlusion, dissection, contrast extravasation or aneurysm. VENOUS SINUSES: Major dural venous sinuses are patent though not tailored for evaluation on this angiographic examination. ANATOMIC VARIANTS: None. DELAYED PHASE: No abnormal intracranial enhancement. IMPRESSION: CTA NECK: Moderately motion degraded examination limits assessment of the carotid bifurcations. No flow limiting stenosis. Severe emphysema. CTA HEAD:  No emergent large vessel occlusion or severe stenosis. Moderate tandem stenosis of the MCA and PCA vessels compatible with atherosclerosis. Electronically Signed   By: Elon Alas M.D.   On: 02/16/2016 22:11   Ct Head Wo Contrast  Result Date: 02/16/2016 CLINICAL DATA:  Code stroke.  Diffuse extremity weakness EXAM: CT HEAD WITHOUT CONTRAST TECHNIQUE: Contiguous axial images were obtained from the base of the skull through the vertex without intravenous contrast. COMPARISON:  None. FINDINGS: Brain: No mass lesion, intraparenchymal hemorrhage or extra-axial collection. No evidence of acute cortical infarct. Brain parenchyma and CSF-containing spaces are normal for age. Relative hypoattenuation of the anterior left temporal lobe on axial images is favored to be artifactual. The basal ganglia are preserved. The insular cortices are  normal. Vascular: No hyperdense vessel or unexpected calcification. Skull: Normal visualized skull base, calvarium and extracranial soft  tissues. Sinuses/Orbits: No sinus fluid levels or advanced mucosal thickening. No mastoid effusion. Normal orbits. ASPECTS [facility] Stroke Program Early CT Score) - Ganglionic level infarction (caudate, lentiform nuclei, internal capsule, insula, M1-M3 cortex): 7 - Supraganglionic infarction (M4-M6 cortex): 3 Total score (0-10 with 10 being normal): 10 IMPRESSION: 1. No acute intracranial abnormality. 2. ASPECTS is 10. These results were called by telephone at the time of interpretation on 02/16/2016 at 8:46 pm to Dr. Milton Ferguson , who verbally acknowledged these results. Electronically Signed   By: Ulyses Jarred M.D.   On: 02/16/2016 20:46   Ct Angio Neck W And/or Wo Contrast  Result Date: 02/16/2016 CLINICAL DATA:  Extremity weakness while cooking 1800 hours, RIGHT greater than LEFT. No focal neurological deficit. History of hypercholesterolemia, COPD, alcohol addiction. EXAM: CT ANGIOGRAPHY HEAD AND NECK TECHNIQUE: Multidetector CT imaging of the head and neck was performed using the standard protocol during bolus administration of intravenous contrast. Multiplanar CT image reconstructions and MIPs were obtained to evaluate the vascular anatomy. Carotid stenosis measurements (when applicable) are obtained utilizing NASCET criteria, using the distal internal carotid diameter as the denominator. CONTRAST:  100 cc Isovue 370 COMPARISON:  CT HEAD February 16, 2016 at 2029 hours FINDINGS: CTA NECK- moderate motion degraded examination. AORTIC ARCH: Normal appearance of the thoracic arch, normal branch pattern. Mild calcific atherosclerosis of the aortic arch. The origins of the innominate, left Common carotid artery and subclavian artery are widely patent. RIGHT CAROTID SYSTEM: Common carotid artery is widely patent, coursing in a straight line fashion. Patent bifurcation without flow limiting stenosis, limited assessment due to motion. Mild calcific atherosclerosis . Normal appearance of the included internal  carotid artery. LEFT CAROTID SYSTEM: Common carotid artery is widely patent, coursing in a straight line fashion. Patent carotid bifurcation without flow limiting stenosis, limited assessment due to motion. Mild calcific atherosclerosis. Normal appearance of the included internal carotid artery. VERTEBRAL ARTERIES:Codominant vertebral arteries. Normal appearance of the vertebral arteries, which appear widely patent. SKELETON: No acute osseous process though bone windows have not been submitted. Poor dentition. Severe C5-6 and C6-7 degenerative discs resulting in moderate to severe LEFT C6-7 neural foraminal narrowing. OTHER NECK: Soft tissues of the neck are non-acute though, not tailored for evaluation. Included lung apices demonstrates severe centrilobular emphysema. CTA HEAD- delayed phase. ANTERIOR CIRCULATION: Normal appearance of the cervical internal carotid arteries, petrous, cavernous and supra clinoid internal carotid arteries. Widely patent anterior communicating artery. Patent bilateral middle cerebral artery with tandem moderate stenoses. Patent anterior cerebral arteries. No large vessel occlusion, dissection, contrast extravasation or aneurysm. POSTERIOR CIRCULATION: Normal appearance of the vertebral arteries, vertebrobasilar junction and basilar artery, as well as main branch vessels. Patent posterior cerebral arteries with moderate tandem stenoses. No large vessel occlusion, dissection, contrast extravasation or aneurysm. VENOUS SINUSES: Major dural venous sinuses are patent though not tailored for evaluation on this angiographic examination. ANATOMIC VARIANTS: None. DELAYED PHASE: No abnormal intracranial enhancement. IMPRESSION: CTA NECK: Moderately motion degraded examination limits assessment of the carotid bifurcations. No flow limiting stenosis. Severe emphysema. CTA HEAD:  No emergent large vessel occlusion or severe stenosis. Moderate tandem stenosis of the MCA and PCA vessels compatible  with atherosclerosis. Electronically Signed   By: Elon Alas M.D.   On: 02/16/2016 22:11   Mr Brain Wo Contrast  Result Date: 02/17/2016 CLINICAL DATA:  Weakness in the right arm and leg. EXAM: MRI  HEAD WITHOUT CONTRAST MRA HEAD WITHOUT CONTRAST TECHNIQUE: Multiplanar, multiecho pulse sequences of the brain and surrounding structures were obtained without intravenous contrast. Angiographic images of the head were obtained using MRA technique without contrast. COMPARISON:  CT head and CTA head neck from yesterday FINDINGS: MRI HEAD FINDINGS Brain: No acute infarction, hemorrhage, hydrocephalus, extra-axial collection or mass lesion. Moderate patchy FLAIR hyperintensity in the cerebral white matter, likely chronic microvascular disease in this patient with multiple vascular risk factors. 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 Vascular: Normal flow voids. Skull and upper cervical spine: Normal marrow signal. Sinuses/Orbits: Negative. MRA HEAD FINDINGS Symmetric carotid and vertebral arteries. No unusual vertebrobasilar branching. Overall mild atherosclerotic irregularity of ACA, MCA, and PCA vessels. Stenosis is up to moderate at the left P2 segment. Negative for aneurysm. IMPRESSION: 1. No acute finding, including infarct. 2. Moderate white matter disease compatible with chronic microvascular ischemia. 3. Intracranial atherosclerosis without flow limiting stenosis. Electronically Signed   By: Monte Fantasia M.D.   On: 02/17/2016 09:56   Dg Chest Portable 1 View  Result Date: 02/16/2016 CLINICAL DATA:  Acute onset extremity weakness at 1800 hours. History of COPD. EXAM: PORTABLE CHEST 1 VIEW COMPARISON:  Chest radiograph October 31, 2015 FINDINGS: Cardiomediastinal silhouette is normal. Mildly calcified aortic knob. No pleural effusions or focal consolidations. Mild chronic interstitial changes with increased lung volumes, flattened hemidiaphragms. Trachea projects midline and there is  no pneumothorax. Soft tissue planes and included osseous structures are non-suspicious. IMPRESSION: Stable examination: COPD, no acute cardiopulmonary process. Electronically Signed   By: Elon Alas M.D.   On: 02/16/2016 21:21   Mr Jodene Nam Head/brain KG Cm  Result Date: 02/17/2016 CLINICAL DATA:  Weakness in the right arm and leg. EXAM: MRI HEAD WITHOUT CONTRAST MRA HEAD WITHOUT CONTRAST TECHNIQUE: Multiplanar, multiecho pulse sequences of the brain and surrounding structures were obtained without intravenous contrast. Angiographic images of the head were obtained using MRA technique without contrast. COMPARISON:  CT head and CTA head neck from yesterday FINDINGS: MRI HEAD FINDINGS Brain: No acute infarction, hemorrhage, hydrocephalus, extra-axial collection or mass lesion. Moderate patchy FLAIR hyperintensity in the cerebral white matter, likely chronic microvascular disease in this patient with multiple vascular risk factors. 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 Vascular: Normal flow voids. Skull and upper cervical spine: Normal marrow signal. Sinuses/Orbits: Negative. MRA HEAD FINDINGS Symmetric carotid and vertebral arteries. No unusual vertebrobasilar branching. Overall mild atherosclerotic irregularity of ACA, MCA, and PCA vessels. Stenosis is up to moderate at the left P2 segment. Negative for aneurysm. IMPRESSION: 1. No acute finding, including infarct. 2. Moderate white matter disease compatible with chronic microvascular ischemia. 3. Intracranial atherosclerosis without flow limiting stenosis. Electronically Signed   By: Monte Fantasia M.D.   On: 02/17/2016 09:56    Microbiology: No results found for this or any previous visit (from the past 240 hour(s)).   Labs: Basic Metabolic Panel:  Recent Labs Lab 02/16/16 2028 02/16/16 2110  NA 138 139  K 4.2 4.0  CL 103 101  CO2 29  --   GLUCOSE 107* 106*  BUN 24* 23*  CREATININE 1.00 1.10  CALCIUM 9.0  --    Liver  Function Tests:  Recent Labs Lab 02/16/16 2028  AST 22  ALT 21  ALKPHOS 47  BILITOT 0.5  PROT 7.3  ALBUMIN 4.3   No results for input(s): LIPASE, AMYLASE in the last 168 hours. No results for input(s): AMMONIA in the last 168 hours. CBC:  Recent  Labs Lab 02/16/16 2028 02/16/16 2110  WBC 7.2  --   NEUTROABS 4.3  --   HGB 13.2 13.6  HCT 39.9 40.0  MCV 94.3  --   PLT 273  --    Cardiac Enzymes: No results for input(s): CKTOTAL, CKMB, CKMBINDEX, TROPONINI in the last 168 hours. BNP: BNP (last 3 results) No results for input(s): BNP in the last 8760 hours.  ProBNP (last 3 results) No results for input(s): PROBNP in the last 8760 hours.  CBG: No results for input(s): GLUCAP in the last 168 hours.     SignedLelon Frohlich  [facility] Pager: [phone] 02/17/2016, 2:45 PM

## 2016-02-17 NOTE — Progress Notes (Signed)
SLP Cancellation Note  Patient Details Name: Robert [name] MRN: [id number] DOB: [birth year]   Cancelled treatment:       Reason Eval/Treat Not Completed: SLP screened, no needs identified, will sign off. Spoke with pt for several minutes; reported no changes in speech/ language/ cognition. Carried on conversation with clarity and appropriate content. MRI negative for acute findings. Will sign off at this time; please re-consult if needs arise.   Kern Reap, MA, CCC-SLP 02/17/2016, 2:31 PM

## 2016-02-17 NOTE — Progress Notes (Signed)
OT Cancellation Note  Patient Details Name: Robert [name] MRN: [id number] DOB: [birth year]   Cancelled Treatment:    Reason Eval/Treat Not Completed: OT screened, no needs identified, will sign off. OT order received. Patient's chart reviewed as well as discussed with PT. Patient is presenting at Mod I level and walking 200 feet. Pt is functioning at baseline. PT reviewed energy conservation techniques during evaluation. Patient mentioned some un coordination in right hand although none noted. Thank you for the review.     Ailene Ravel, OTR/L,CBIS  [phone]  02/17/2016, 12:58 PM

## 2016-02-17 NOTE — Evaluation (Signed)
Physical Therapy Evaluation Patient Details Name: Robert [name] MRN: [id number] DOB: [birth year] Today's Date: 02/17/2016   History of Present Illness  61 y.o. male, With history of COPD on chronic home O2, bipolar disorder, depression came to hospital with right sided weakness which started when patient was cooking at home. Patient says that more than weakness he experienced unsteadiness on his feet with some weakness on right side. Weakness has seemed to resolved at this time. Code stroke was called initially and patient refused TPA. CT head and CT angiogram of neck did not show any significant abnormality.  MRI (-).  Clinical Impression  Pt received in bed, and was agreeable to PT evaluation.  Pt expressed that he is normally independent with short distance ambulation, however he still gets out into the community and drives.  He is independent with dressing and bathing, but states it takes a long time and it has become more difficult over the last year.  During PT evaluation, he was able to perform sit<>stand transfers at Mod (I) level, as well as ambulated 244f at Mod (I) level pushing his own O2 tank.  He did required 2 standing rest breaks where he propped his back against the wall, however his SpO2 was 92-93% on 3L.  Encouraged pt to perform PLB when feeling SOB, as well as discussed multiple ways to conserve energy at home.  This included spreading tasks out throughout the day, obtaining a shower chair and hand held shower head, having a seat in the kitchen, and using the motorized cart at the store.  Pt does not demonstrate need for continued skilled acute PT, however he may benefit from outpatient pulmonary rehab at d/c.      Follow Up Recommendations Other (comment) (pulmonary rehab)    Equipment Recommendations  None recommended by PT    Recommendations for Other Services       Precautions / Restrictions Precautions Precautions: None Restrictions Weight Bearing Restrictions: No       Mobility  Bed Mobility Overal bed mobility: Modified Independent                Transfers Overall transfer level: Modified independent                  Ambulation/Gait Ambulation/Gait assistance: Modified independent (Device/Increase time) Ambulation Distance (Feet): 200 Feet Assistive device: None Gait Pattern/deviations: WFL(Within Functional Limits)     General Gait Details: Pushing his own O2 tank, however pt required  2 standing rest breaks due to feeling SOB, however pt's SpO2 was 92-93% on 3L.  Pt has his own pulse oximeter that he uses to check at home.    Stairs            Wheelchair Mobility    Modified Rankin (Stroke Patients Only)       Balance Overall balance assessment: Modified Independent                                           Pertinent Vitals/Pain Pain Assessment: No/denies pain    Home Living   Living Arrangements: Children (dtr, and son lives within a mile) Available Help at Discharge: Other (Comment) (ex wife will assist with groceries at times. ) Type of Home: Mobile home Home Access: Stairs to enter   [employer] Steps: 4 steps to the deck, and 3 steps at the back.   Home  Layout: One level Home Equipment: Other (comment) (O2 on 3 L all the time. )      Prior Function     Gait / Transfers Assistance Needed: independent, but cannot go long distances.  Has difficulty walking through the grocery store.  Can't do all the shopping at one time.   ADL's / Homemaking Assistance Needed: independent, however he expresses that it fatigues him quickly.  Still driving.         Hand Dominance   Dominant Hand: Right    Extremity/Trunk Assessment   Upper Extremity Assessment Upper Extremity Assessment: Overall WFL for tasks assessed    Lower Extremity Assessment Lower Extremity Assessment: Overall WFL for tasks assessed       Communication   Communication: No difficulties   Cognition Arousal/Alertness: Awake/alert Behavior During Therapy: WFL for tasks assessed/performed Overall Cognitive Status: Within Functional Limits for tasks assessed                      General Comments      Exercises     Assessment/Plan    PT Assessment Patent does not need any further PT services  PT Problem List            PT Treatment Interventions      PT Goals (Current goals can be found in the Care Plan section)  Acute Rehab PT Goals PT Goal Formulation: All assessment and education complete, DC therapy    Frequency     Barriers to discharge        Co-evaluation               End of Session Equipment Utilized During Treatment: Gait belt;Oxygen Activity Tolerance: Patient limited by fatigue Patient left: with call bell/phone within reach (Sitting on the EOB) Nurse Communication: Mobility status (mobility sheet left hanging in the room.  )    Functional Assessment Tool Used: [place] "6-clicks"  Functional Limitation: Mobility: Walking and moving around Mobility: Walking and Moving Around Current Status [phone]): At least 1 percent but less than 20 percent impaired, limited or restricted Mobility: Walking and Moving Around Goal Status [phone]): At least 1 percent but less than 20 percent impaired, limited or restricted Mobility: Walking and Moving Around Discharge Status [phone]): At least 1 percent but less than 20 percent impaired, limited or restricted    Time: 5427-0623 PT Time Calculation (min) (ACUTE ONLY): 27 min   Charges:   PT Evaluation $PT Eval Low Complexity: 1 Procedure PT Treatments $Gait Training: 8-22 mins   PT G Codes:   PT G-Codes **NOT FOR INPATIENT CLASS** Functional Assessment Tool Used: [employer] "6-clicks"  Functional Limitation: Mobility: Walking and moving around Mobility: Walking and Moving Around Current Status [phone]): At least 1 percent but less than 20 percent impaired,  limited or restricted Mobility: Walking and Moving Around Goal Status [phone]): At least 1 percent but less than 20 percent impaired, limited or restricted Mobility: Walking and Moving Around Discharge Status [phone]): At least 1 percent but less than 20 percent impaired, limited or restricted    Beth Amber Williard, PT, DPT X: [phone]

## 2016-02-17 NOTE — Progress Notes (Signed)
*  PRELIMINARY RESULTS* Echocardiogram 2D Echocardiogram has been performed.  Samuel Germany 02/17/2016, 11:01 AM

## 2016-02-17 NOTE — Progress Notes (Signed)
OT Cancellation Note  Patient Details Name: MARUICE PIERONI MRN: [id number] DOB: 07-10-1955   Cancelled Treatment:     Reason evaluation not completed: Pt unavailable. Pt taken down for additional testing, will attempt evaluation at a later time.   Guadelupe Sabin, OTR/L  [phone] 02/17/2016, 9:24 AM

## 2016-02-17 NOTE — Care Management Note (Signed)
Case Management Note  Patient Details  Name: Robert [name] MRN: [id number] Date of Birth: 03/31/1955  Subjective/Objective:                  Pt from home with family. Pt ind with ADL's. Pt has supplemental oxygen and neb machine PTA. He has no HH services PTA. He plans to return home with self care. Pt has PCP and transportation. No difficulty affording or managing medications.   Action/Plan: No CM needs.   Expected Discharge Date:  02/17/16               Expected Discharge Plan:  Home/Self Care  In-House Referral:  NA  Discharge planning Services  CM Consult  Post Acute Care Choice:  NA Choice offered to:  NA  Status of Service:  Completed, signed off  Sherald Barge, RN 02/17/2016, 3:15 PM

## 2016-02-17 NOTE — Care Management Important Message (Deleted)
Important Message  Patient Details  Name: BART ASHFORD MRN: [id number] Date of Birth: 03/18/1955   Medicare Important Message Given:  Yes    Sherald Barge, RN 02/17/2016, 1:45 PM

## 2016-02-18 LAB — HEMOGLOBIN A1C
Hgb A1c MFr Bld: 5.2 % (ref 4.8–5.6)
Mean Plasma Glucose: 103 mg/dL

## 2016-05-16 ENCOUNTER — Encounter (HOSPITAL_COMMUNITY): Payer: Self-pay | Admitting: *Deleted

## 2016-05-16 ENCOUNTER — Emergency Department (HOSPITAL_COMMUNITY)
Admission: EM | Admit: 2016-05-16 | Discharge: 2016-05-16 | Disposition: A | Discharge status: Home / Self Care | Payer: Medicare Other | Attending: Emergency Medicine | Admitting: Emergency Medicine

## 2016-05-16 ENCOUNTER — Emergency Department (HOSPITAL_COMMUNITY): Payer: Medicare Other

## 2016-05-16 DIAGNOSIS — J441 Chronic obstructive pulmonary disease with (acute) exacerbation: Secondary | ICD-10-CM | POA: Insufficient documentation

## 2016-05-16 DIAGNOSIS — Z79899 Other long term (current) drug therapy: Secondary | ICD-10-CM | POA: Diagnosis not present

## 2016-05-16 DIAGNOSIS — F17210 Nicotine dependence, cigarettes, uncomplicated: Secondary | ICD-10-CM | POA: Diagnosis not present

## 2016-05-16 DIAGNOSIS — R0602 Shortness of breath: Secondary | ICD-10-CM | POA: Diagnosis present

## 2016-05-16 DIAGNOSIS — Z7982 Long term (current) use of aspirin: Secondary | ICD-10-CM | POA: Diagnosis not present

## 2016-05-16 LAB — CBC WITH DIFFERENTIAL/PLATELET
BASOS ABS: 0 10*3/uL (ref 0.0–0.1)
BASOS PCT: 0 %
Eosinophils Absolute: 0.2 10*3/uL (ref 0.0–0.7)
Eosinophils Relative: 2 %
HEMATOCRIT: 39 % (ref 39.0–52.0)
HEMOGLOBIN: 12.9 g/dL — AB (ref 13.0–17.0)
LYMPHS PCT: 30 %
Lymphs Abs: 2 10*3/uL (ref 0.7–4.0)
MCH: 30.9 pg (ref 26.0–34.0)
MCHC: 33.1 g/dL (ref 30.0–36.0)
MCV: 93.5 fL (ref 78.0–100.0)
MONO ABS: 0.5 10*3/uL (ref 0.1–1.0)
MONOS PCT: 8 %
NEUTROS ABS: 4 10*3/uL (ref 1.7–7.7)
NEUTROS PCT: 60 %
Platelets: 336 10*3/uL (ref 150–400)
RBC: 4.17 MIL/uL — ABNORMAL LOW (ref 4.22–5.81)
RDW: 12.5 % (ref 11.5–15.5)
WBC: 6.7 10*3/uL (ref 4.0–10.5)

## 2016-05-16 LAB — TROPONIN I: Troponin I: <0.03 ng/mL (ref <0.03)

## 2016-05-16 LAB — BASIC METABOLIC PANEL
ANION GAP: 7 (ref 5–15)
BUN: 16 mg/dL (ref 6–20)
CHLORIDE: 99 mmol/L — AB (ref 101–111)
CO2: 31 mmol/L (ref 22–32)
Calcium: 9.2 mg/dL (ref 8.9–10.3)
Creatinine, Ser: 1.13 mg/dL (ref 0.61–1.24)
GFR calc non Af Amer: >60 mL/min (ref >60)
GLUCOSE: 102 mg/dL — AB (ref 65–99)
Potassium: 4.2 mmol/L (ref 3.5–5.1)
Sodium: 137 mmol/L (ref 135–145)

## 2016-05-16 MED ORDER — BUDESONIDE-FORMOTEROL FUMARATE 160-4.5 MCG/ACT IN AERO: 2.0000 | INHALATION_SPRAY | Freq: Two times a day (BID) | RESPIRATORY_TRACT | 0 refills | Status: DC

## 2016-05-16 MED ORDER — DOXYCYCLINE HYCLATE 100 MG PO TABS
100.0000 mg | ORAL_TABLET | Freq: Once | ORAL | Status: AC
  Administered 2016-05-16: 100 mg via ORAL
  Filled 2016-05-16: qty 1

## 2016-05-16 MED ORDER — METHYLPREDNISOLONE SODIUM SUCC 125 MG IJ SOLR
125.0000 mg | Freq: Once | INTRAMUSCULAR | Status: AC
  Administered 2016-05-16: 125 mg via INTRAVENOUS
  Filled 2016-05-16: qty 2

## 2016-05-16 MED ORDER — IPRATROPIUM-ALBUTEROL 0.5-2.5 (3) MG/3ML IN SOLN
RESPIRATORY_TRACT | Status: AC
  Filled 2016-05-16: qty 3

## 2016-05-16 MED ORDER — ALBUTEROL SULFATE (2.5 MG/3ML) 0.083% IN NEBU
5.0000 mg | INHALATION_SOLUTION | Freq: Once | RESPIRATORY_TRACT | Status: DC
  Filled 2016-05-16: qty 6

## 2016-05-16 MED ORDER — ALBUTEROL (5 MG/ML) CONTINUOUS INHALATION SOLN
10.0000 mg/h | INHALATION_SOLUTION | Freq: Once | RESPIRATORY_TRACT | Status: AC
  Administered 2016-05-16: 10 mg/h via RESPIRATORY_TRACT
  Filled 2016-05-16: qty 20

## 2016-05-16 MED ORDER — ALBUTEROL SULFATE (2.5 MG/3ML) 0.083% IN NEBU
2.5000 mg | INHALATION_SOLUTION | Freq: Once | RESPIRATORY_TRACT | Status: AC
  Administered 2016-05-16: 2.5 mg via RESPIRATORY_TRACT

## 2016-05-16 MED ORDER — IPRATROPIUM-ALBUTEROL 0.5-2.5 (3) MG/3ML IN SOLN
3.0000 mL | Freq: Once | RESPIRATORY_TRACT | Status: AC
  Administered 2016-05-16: 3 mL via RESPIRATORY_TRACT
  Administered 2016-05-16: 14:00:00 via RESPIRATORY_TRACT

## 2016-05-16 MED ORDER — PREDNISONE 20 MG PO TABS: 40.0000 mg | ORAL_TABLET | Freq: Every day | ORAL | 0 refills | Status: DC

## 2016-05-16 MED ORDER — IPRATROPIUM BROMIDE 0.02 % IN SOLN
1.0000 mg | Freq: Once | RESPIRATORY_TRACT | Status: AC
  Administered 2016-05-16: 1 mg via RESPIRATORY_TRACT
  Filled 2016-05-16: qty 5

## 2016-05-16 MED ORDER — ALBUTEROL SULFATE HFA 108 (90 BASE) MCG/ACT IN AERS: 2.0000 | INHALATION_SPRAY | RESPIRATORY_TRACT | 0 refills | Status: DC | PRN

## 2016-05-16 MED ORDER — DOXYCYCLINE HYCLATE 100 MG PO TABS: 100.0000 mg | ORAL_TABLET | Freq: Two times a day (BID) | ORAL | 0 refills | Status: DC

## 2016-05-16 MED ORDER — ALBUTEROL SULFATE (2.5 MG/3ML) 0.083% IN NEBU
INHALATION_SOLUTION | RESPIRATORY_TRACT | Status: AC
  Filled 2016-05-16: qty 3

## 2016-05-16 NOTE — Discharge Instructions (Signed)
Take the prescriptions as directed.  Use your albuterol inhaler (2 to 4 puffs) or your albuterol nebulizer (1 unit dose) every 4 hours for the next 7 days, then as needed for cough, wheezing, or shortness of breath.  Call your regular medical doctor tomorrow morning to schedule a follow up appointment within the next 2 days.  Return to the Emergency Department immediately sooner if worsening.

## 2016-05-16 NOTE — ED Provider Notes (Signed)
[place] DEPT Provider Note   CSN: [id number] Arrival date & time: 05/16/16  1315     History   Chief Complaint Chief Complaint  Patient presents with  . Shortness of Breath    HPI Robert [name] is a 61 y.o. male.  HPI  Pt was seen at 1540.  Per pt, c/o gradual onset and worsening of persistent cough, wheezing and SOB for the past 3 to 4 days.  Describes his symptoms as "my COPD."  Has been using home O2, MDI and nebs with transient relief. Symptoms worsen with walking.  Denies CP/palpitations, no back pain, no abd pain, no N/V/D, no fevers, no rash.    Past Medical History:  Diagnosis Date  . Alcohol addiction ([place])   . Anxiety   . Bipolar 1 disorder ([place])   . COPD (chronic obstructive pulmonary disease) ([place])   . Depression   . High cholesterol   . Panic   . PTSD (post-traumatic stress disorder)     Patient Active Problem List   Diagnosis Date Noted  . TIA (transient ischemic attack) 02/16/2016  . COPD exacerbation ([place]) 10/31/2015  . COPD (chronic obstructive pulmonary disease) ([place])   . High cholesterol   . PTSD (post-traumatic stress disorder)   . Alcohol addiction ([place])   . Bipolar 1 disorder [facility])     Past Surgical History:  Procedure Laterality Date  . TONSILLECTOMY         Home Medications    Prior to Admission medications   Medication Sig Start Date End Date Taking? Authorizing Provider  albuterol (PROVENTIL HFA) 108 (90 BASE) MCG/ACT inhaler Inhale 2 puffs into the lungs every 6 (six) hours as needed for wheezing or shortness of breath.   Yes [provider]  aspirin EC 81 MG tablet Take 1 tablet (81 mg total) by mouth daily. 02/17/16  Yes Isaac Bliss, Rayford Halsted, MD  budesonide-formoterol [facility]) 160-4.5 MCG/ACT inhaler Inhale 2 puffs into the lungs 2 (two) times daily.   Yes [provider]  diphenhydrAMINE (BENADRYL) 25 mg capsule Take 25 mg by mouth at bedtime as needed for sleep.    Yes [provider]  DULoxetine (CYMBALTA) 60 MG capsule Take 60 mg by mouth daily.   Yes [provider]  haloperidol (HALDOL) 0.5 MG tablet Take 1 tablet (0.5 mg total) by mouth at bedtime. 02/17/16  Yes Isaac Bliss, Rayford Halsted, MD  ipratropium-albuterol (DUONEB) 0.5-2.5 (3) MG/3ML SOLN Take 3 mLs by nebulization every 6 (six) hours as needed (shortness of breath).    Yes [provider]  lithium carbonate 300 MG capsule Take 1 capsule (300 mg total) by mouth 2 (two) times daily with a meal. 02/17/16  Yes Isaac Bliss, Rayford Halsted, MD  Naphazoline HCl (CLEAR EYES OP) Place 2 drops into both eyes daily as needed (red eyes).   Yes [provider]  OXYGEN Inhale 2-3 L into the lungs daily. 3l while active and 2l while resting   Yes [provider]  sodium chloride (OCEAN) 0.65 % SOLN nasal spray Place 1 spray into both nostrils as needed for congestion. 11/20/14  Yes Forde Dandy, MD    Family History Family History  Problem Relation Age of Onset  . Coronary artery disease Mother   . Coronary artery disease Father     Social History Social History  Substance Use Topics  . Smoking status: Current Some Day Smoker    Packs/day: 0.50    Years: 45.00  Types: Cigarettes  . Smokeless tobacco: Never Used  . Alcohol use Yes     Comment: heavily     Allergies   Patient has no known allergies.   Review of Systems Review of Systems ROS: Statement: All systems negative except as marked or noted in the HPI; Constitutional: Negative for fever and chills. ; ; Eyes: Negative for eye pain, redness and discharge. ; ; ENMT: Negative for ear pain, hoarseness, nasal congestion, sinus pressure and sore throat. ; ; Cardiovascular: Negative for chest pain, palpitations, diaphoresis, and peripheral edema. ; ; Respiratory: +SOB, wheezing, cough. Negative for stridor. ; ; Gastrointestinal: Negative for nausea, vomiting, diarrhea, abdominal pain, blood in stool, hematemesis, jaundice and  rectal bleeding. . ; ; Genitourinary: Negative for dysuria, flank pain and hematuria. ; ; Musculoskeletal: Negative for back pain and neck pain. Negative for swelling and trauma.; ; Skin: Negative for pruritus, rash, abrasions, blisters, bruising and skin lesion.; ; Neuro: Negative for headache, lightheadedness and neck stiffness. Negative for weakness, altered level of consciousness, altered mental status, extremity weakness, paresthesias, involuntary movement, seizure and syncope.       Physical Exam Updated Vital Signs BP (!) 139/95   Pulse 86   Temp 98.3 F (36.8 C) (Oral)   Resp (!) 30   SpO2 100%    Patient Vitals for the past 24 hrs:  BP Temp Temp src Pulse Resp SpO2  05/16/16 1630 (!) 139/95 - - 86 - 100 %  05/16/16 1600 128/85 - - 85 - 98 %  05/16/16 1335 - - - - - 98 %  05/16/16 1319 (!) 158/87 98.3 F (36.8 C) Oral (!) 101 (!) 30 98 %     Physical Exam 1545: Physical examination:  Nursing notes reviewed; Vital signs and O2 SAT reviewed;  Constitutional: Well developed, Well nourished, Well hydrated, uncomfortable appearing.; Head:  Normocephalic, atraumatic; Eyes: EOMI, PERRL, No scleral icterus; ENMT: Mouth and pharynx normal, Mucous membranes moist; Neck: Supple, Full range of motion, No lymphadenopathy; Cardiovascular: Tachycardic rate and rhythm, No gallop; Respiratory: Breath sounds diminished & equal bilaterally, scattered insp/exp wheezes. No audible wheezing. Speaking sentences, sitting upright, tachypneic, No access mm use; Chest: Nontender, Movement normal; Abdomen: Soft, Nontender, Nondistended, Normal bowel sounds; Genitourinary: No CVA tenderness; Extremities: Pulses normal, No tenderness, No edema, No calf edema or asymmetry.; Neuro: AA&Ox3, Major CN grossly intact.  Speech clear. No gross focal motor or sensory deficits in extremities.; Skin: Color normal, Warm, Dry.   ED Treatments / Results  Labs (all labs ordered are listed, but only abnormal results are  displayed)   EKG  EKG Interpretation  Date/Time:  Wednesday May 16 2016 13:21:51 EDT Ventricular Rate:  100 PR Interval:  168 QRS Duration: 76 QT Interval:  344 QTC Calculation: 443 R Axis:   81 Text Interpretation:  Normal sinus rhythm Normal ECG When compared with ECG of 02/16/2016 No significant change was found Confirmed by [facility]  MD, Nunzio Cory [phone]) on 05/16/2016 3:54:51 PM       Radiology   Procedures Procedures (including critical care time)  Medications Ordered in ED Medications  albuterol (PROVENTIL,VENTOLIN) solution continuous neb (not administered)  ipratropium (ATROVENT) nebulizer solution 1 mg (not administered)  albuterol (PROVENTIL) (2.5 MG/3ML) 0.083% nebulizer solution 2.5 mg (2.5 mg Nebulization Given 05/16/16 1335)  ipratropium-albuterol (DUONEB) 0.5-2.5 (3) MG/3ML nebulizer solution 3 mL (3 mLs Nebulization Given 05/16/16 1418)  albuterol (PROVENTIL) (2.5 MG/3ML) 0.083% nebulizer solution 2.5 mg (2.5 mg Nebulization Given 05/16/16 1418)  methylPREDNISolone sodium succinate (  SOLU-MEDROL) 125 mg/2 mL injection 125 mg (125 mg Intravenous Given 05/16/16 1635)     Initial Impression / Assessment and Plan / ED Course  I have reviewed the triage vital signs and the nursing notes.  Pertinent labs & imaging results that were available during my care of the patient were reviewed by me and considered in my medical decision making (see chart for details).  MDM Reviewed: previous chart, nursing note and vitals Reviewed previous: labs and ECG Interpretation: labs, x-ray and ECG   Results for orders placed or performed during the hospital encounter of 05/16/16  Troponin I  Result Value Ref Range   Troponin I <0.03 <0.03 ng/mL  CBC with Differential  Result Value Ref Range   WBC 6.7 4.0 - 10.5 K/uL   RBC 4.17 (L) 4.22 - 5.81 MIL/uL   Hemoglobin 12.9 (L) 13.0 - 17.0 g/dL   HCT 39.0 39.0 - 52.0 %   MCV 93.5 78.0 - 100.0 fL   MCH 30.9 26.0 - 34.0 pg   MCHC 33.1 30.0  - 36.0 g/dL   RDW 12.5 11.5 - 15.5 %   Platelets 336 150 - 400 K/uL   Neutrophils Relative % 60 %   Neutro Abs 4.0 1.7 - 7.7 K/uL   Lymphocytes Relative 30 %   Lymphs Abs 2.0 0.7 - 4.0 K/uL   Monocytes Relative 8 %   Monocytes Absolute 0.5 0.1 - 1.0 K/uL   Eosinophils Relative 2 %   Eosinophils Absolute 0.2 0.0 - 0.7 K/uL   Basophils Relative 0 %   Basophils Absolute 0.0 0.0 - 0.1 K/uL  Basic metabolic panel  Result Value Ref Range   Sodium 137 135 - 145 mmol/L   Potassium 4.2 3.5 - 5.1 mmol/L   Chloride 99 (L) 101 - 111 mmol/L   CO2 31 22 - 32 mmol/L   Glucose, Bld 102 (H) 65 - 99 mg/dL   BUN 16 6 - 20 mg/dL   Creatinine, Ser 1.13 0.61 - 1.24 mg/dL   Calcium 9.2 8.9 - 10.3 mg/dL   GFR calc non Af Amer >60 >60 mL/min   GFR calc Af Amer >60 >60 mL/min   Anion gap 7 5 - 15   Dg Chest 2 View Result Date: 05/16/2016 CLINICAL DATA:  Shortness of breath EXAM: CHEST  2 VIEW COMPARISON:  02/16/2016 FINDINGS: History of COPD with chronic hyperinflation. Chronic airway thickening. There is no edema, consolidation, effusion, or pneumothorax. Normal heart size and aortic contours. Aortic atherosclerosis. IMPRESSION: COPD without acute superimposed finding. Electronically Signed   By: Monte Fantasia M.D.   On: 05/16/2016 13:59    1810:  Pt states he "feels better" after neb and steroid.  NAD, lungs diminished bilat, faint scattered wheezing continues, resps easy, speaking full sentences, Sats 98% on his usual O2 3L N/C.  Pt ambulated around the ED with Sats remaining 98-100 % on his usual O2 N/C, NAD while walking, but then became anxious and began to hyperventilate. Offered admission, pt refuses admission and wants to go home now. Pt makes his own medical decisions. Risks/benefits explained, pt continues to refuse. Strict return precautions given. Dx and testing d/w pt.  Questions answered.  Verb understanding, agreeable to d/c home with outpt f/u.    Final Clinical Impressions(s) / ED  Diagnoses   Final diagnoses:  None    New Prescriptions New Prescriptions   No medications on file     Francine Graven, DO 05/19/16 2149

## 2016-05-16 NOTE — ED Notes (Signed)
RN and MD offered admission. RN explained the importance of being admitted for medical care and pt still refused.

## 2016-05-16 NOTE — ED Notes (Signed)
Patient ambulated around the emergency room.  Patient's o2 sat on 2 lpm remained at 98-100%. Patient very anxious and started to hyperventilate once we got back into the room and I was switching oxygen over to wall unit.

## 2016-05-16 NOTE — ED Triage Notes (Signed)
Pt states he has been wheezing and coughing 2-3 days ago. His cough is productive, yellow in color. He has bilateral leg swelling. Pt wears 3L oxygen all the time. States he has had to increase it lately. Pt lung sounds are inspiratory and expiratory wheezing.

## 2017-01-31 ENCOUNTER — Other Ambulatory Visit: Payer: Self-pay

## 2017-01-31 ENCOUNTER — Emergency Department (HOSPITAL_COMMUNITY)
Admission: EM | Admit: 2017-01-31 | Discharge: 2017-01-31 | Disposition: A | Discharge status: Home / Self Care | Payer: Medicare Other | Attending: Emergency Medicine | Admitting: Emergency Medicine

## 2017-01-31 ENCOUNTER — Encounter (HOSPITAL_COMMUNITY): Payer: Self-pay | Admitting: Emergency Medicine

## 2017-01-31 ENCOUNTER — Emergency Department (HOSPITAL_COMMUNITY): Payer: Medicare Other

## 2017-01-31 DIAGNOSIS — R0602 Shortness of breath: Secondary | ICD-10-CM | POA: Diagnosis present

## 2017-01-31 DIAGNOSIS — J441 Chronic obstructive pulmonary disease with (acute) exacerbation: Secondary | ICD-10-CM | POA: Diagnosis not present

## 2017-01-31 DIAGNOSIS — Z7982 Long term (current) use of aspirin: Secondary | ICD-10-CM | POA: Diagnosis not present

## 2017-01-31 DIAGNOSIS — F17210 Nicotine dependence, cigarettes, uncomplicated: Secondary | ICD-10-CM | POA: Insufficient documentation

## 2017-01-31 DIAGNOSIS — Z79899 Other long term (current) drug therapy: Secondary | ICD-10-CM | POA: Diagnosis not present

## 2017-01-31 LAB — CBC WITH DIFFERENTIAL/PLATELET
BASOS ABS: 0 10*3/uL (ref 0.0–0.1)
Basophils Relative: 0 %
EOS PCT: 1 %
Eosinophils Absolute: 0.1 10*3/uL (ref 0.0–0.7)
HCT: 39.3 % (ref 39.0–52.0)
HEMOGLOBIN: 12.7 g/dL — AB (ref 13.0–17.0)
LYMPHS ABS: 1.7 10*3/uL (ref 0.7–4.0)
LYMPHS PCT: 15 %
MCH: 30.1 pg (ref 26.0–34.0)
MCHC: 32.3 g/dL (ref 30.0–36.0)
MCV: 93.1 fL (ref 78.0–100.0)
Monocytes Absolute: 1.1 10*3/uL — ABNORMAL HIGH (ref 0.1–1.0)
Monocytes Relative: 10 %
NEUTROS ABS: 8.6 10*3/uL — AB (ref 1.7–7.7)
NEUTROS PCT: 74 %
PLATELETS: 265 10*3/uL (ref 150–400)
RBC: 4.22 MIL/uL (ref 4.22–5.81)
RDW: 12.6 % (ref 11.5–15.5)
WBC: 11.5 10*3/uL — AB (ref 4.0–10.5)

## 2017-01-31 LAB — COMPREHENSIVE METABOLIC PANEL
ALT: 16 U/L — ABNORMAL LOW (ref 17–63)
ANION GAP: 12 (ref 5–15)
AST: 17 U/L (ref 15–41)
Albumin: 4.2 g/dL (ref 3.5–5.0)
Alkaline Phosphatase: 55 U/L (ref 38–126)
BILIRUBIN TOTAL: 1.1 mg/dL (ref 0.3–1.2)
BUN: 13 mg/dL (ref 6–20)
CHLORIDE: 99 mmol/L — AB (ref 101–111)
CO2: 26 mmol/L (ref 22–32)
Calcium: 9.2 mg/dL (ref 8.9–10.3)
Creatinine, Ser: 0.76 mg/dL (ref 0.61–1.24)
Glucose, Bld: 110 mg/dL — ABNORMAL HIGH (ref 65–99)
POTASSIUM: 3.7 mmol/L (ref 3.5–5.1)
Sodium: 137 mmol/L (ref 135–145)
TOTAL PROTEIN: 7.9 g/dL (ref 6.5–8.1)

## 2017-01-31 LAB — LACTIC ACID, PLASMA: Lactic Acid, Venous: 0.6 mmol/L (ref 0.5–1.9)

## 2017-01-31 MED ORDER — IPRATROPIUM-ALBUTEROL 0.5-2.5 (3) MG/3ML IN SOLN
3.0000 mL | Freq: Once | RESPIRATORY_TRACT | Status: AC
  Administered 2017-01-31: 3 mL via RESPIRATORY_TRACT
  Filled 2017-01-31: qty 3

## 2017-01-31 MED ORDER — SODIUM CHLORIDE 0.9 % IV SOLN
INTRAVENOUS | Status: DC
  Administered 2017-01-31: 20:00:00 via INTRAVENOUS

## 2017-01-31 MED ORDER — METHYLPREDNISOLONE SODIUM SUCC 125 MG IJ SOLR
125.0000 mg | Freq: Once | INTRAMUSCULAR | Status: AC
  Administered 2017-01-31: 125 mg via INTRAVENOUS
  Filled 2017-01-31: qty 2

## 2017-01-31 MED ORDER — PREDNISONE 10 MG PO TABS: 40.0000 mg | ORAL_TABLET | Freq: Every day | ORAL | 0 refills | Status: DC

## 2017-01-31 NOTE — Discharge Instructions (Signed)
Take the prednisone as directed for the next 5 days.  As we discussed today's workup without any significant findings.  Chest x-ray negative.  Screening test for serious infection was also negative.  Return for any new or worse symptoms.  Usual albuterol inhaler or nebulizer treatments at home every 6 hours.

## 2017-01-31 NOTE — ED Provider Notes (Signed)
[name] EMERGENCY DEPARTMENT Provider Note   CSN: [id number] Arrival date & time: 01/31/17  3382     History   Chief Complaint Chief Complaint  Patient presents with  . Fever    HPI Robert [name] is a 62 y.o. male.  Patient with worse shortness of breath and not feeling well today.  Thought that he may have a low-grade fever.  Was concerned about developing pneumonia.  About a year ago he got very sick with pneumonia.  And became septic.  Patient has a known history of COPD.  Patient just finished a course of steroids about a week ago.  Uses inhalers at home.  Patient normally on 2 L of oxygen patient felt as if he had chills as well.      Past Medical History:  Diagnosis Date  . Alcohol addiction ([place])   . Anxiety   . Bipolar 1 disorder ([place])   . COPD (chronic obstructive pulmonary disease) ([place])   . Depression   . High cholesterol   . Panic   . PTSD (post-traumatic stress disorder)     Patient Active Problem List   Diagnosis Date Noted  . TIA (transient ischemic attack) 02/16/2016  . COPD exacerbation ([place]) 10/31/2015  . COPD (chronic obstructive pulmonary disease) ([place])   . High cholesterol   . PTSD (post-traumatic stress disorder)   . Alcohol addiction ([place])   . Bipolar 1 disorder [facility])     Past Surgical History:  Procedure Laterality Date  . TONSILLECTOMY         Home Medications    Prior to Admission medications   Medication Sig Start Date End Date Taking? Authorizing Provider  albuterol (PROVENTIL HFA) 108 (90 BASE) MCG/ACT inhaler Inhale 2 puffs into the lungs every 6 (six) hours as needed for wheezing or shortness of breath.    [provider]  albuterol (PROVENTIL HFA;VENTOLIN HFA) 108 (90 BASE) MCG/ACT inhaler Inhale 2 puffs into the lungs every 4 (four) hours as needed for wheezing or shortness of breath. 09/16/11 05/16/17  Jola Schmidt, MD  albuterol (PROVENTIL HFA;VENTOLIN HFA) 108 (90 Base) MCG/ACT inhaler Inhale 2 puffs into  the lungs every 4 (four) hours as needed for wheezing or shortness of breath. 05/16/16   Francine Graven, DO  albuterol (PROVENTIL) (2.5 MG/3ML) 0.083% nebulizer solution Take 2.5 mg by nebulization every 4 (four) hours as needed. 07/08/11 05/16/17  Lily Kocher, PA-C  aspirin EC 81 MG tablet Take 1 tablet (81 mg total) by mouth daily. 02/17/16   Isaac Bliss, Rayford Halsted, MD  budesonide-formoterol [facility]) 160-4.5 MCG/ACT inhaler Inhale 2 puffs into the lungs 2 (two) times daily.    [provider]  budesonide-formoterol (SYMBICORT) 160-4.5 MCG/ACT inhaler Inhale 2 puffs into the lungs 2 (two) times daily. 05/16/16   Francine Graven, DO  diphenhydrAMINE (BENADRYL) 25 mg capsule Take 25 mg by mouth at bedtime as needed for sleep.     [provider]  doxycycline (VIBRA-TABS) 100 MG tablet Take 1 tablet (100 mg total) by mouth 2 (two) times daily. 05/16/16   Francine Graven, DO  DULoxetine (CYMBALTA) 60 MG capsule Take 60 mg by mouth daily.    [provider]  haloperidol (HALDOL) 0.5 MG tablet Take 1 tablet (0.5 mg total) by mouth at bedtime. 02/17/16   Isaac Bliss, Rayford Halsted, MD  ipratropium-albuterol (DUONEB) 0.5-2.5 (3) MG/3ML SOLN Take 3 mLs by nebulization every 6 (six) hours as needed (shortness of breath).     [provider]  lithium carbonate 300 MG capsule Take 1 capsule (300 mg total) by mouth 2 (two) times daily with a meal. 02/17/16   Isaac Bliss, Rayford Halsted, MD  Naphazoline HCl (CLEAR EYES OP) Place 2 drops into both eyes daily as needed (red eyes).    [provider]  OXYGEN Inhale 2-3 L into the lungs daily. 3l while active and 2l while resting    [provider]  predniSONE (DELTASONE) 10 MG tablet Take 4 tablets (40 mg total) by mouth daily. 01/31/17   Fredia Sorrow, MD  predniSONE (DELTASONE) 20 MG tablet Take 2 tablets (40 mg total) by mouth daily. 05/16/16   Francine Graven, DO  sodium chloride (OCEAN) 0.65 % SOLN nasal  spray Place 1 spray into both nostrils as needed for congestion. 11/20/14   Forde Dandy, MD    Family History Family History  Problem Relation Age of Onset  . Coronary artery disease Mother   . Coronary artery disease Father     Social History Social History   Tobacco Use  . Smoking status: Current Some Day Smoker    Packs/day: 0.50    Years: 45.00    Pack years: 22.50    Types: Cigarettes  . Smokeless tobacco: Never Used  Substance Use Topics  . Alcohol use: No    Frequency: Never    Comment: heavily  . Drug use: No     Allergies   Patient has no known allergies.   Review of Systems Review of Systems  Constitutional: Positive for chills and fatigue. Negative for fever.  HENT: Negative for congestion.   Eyes: Negative for visual disturbance.  Respiratory: Positive for shortness of breath.   Cardiovascular: Negative for chest pain.  Gastrointestinal: Negative for abdominal pain.  Genitourinary: Negative for dysuria.  Musculoskeletal: Negative for myalgias.  Skin: Negative for rash.  Neurological: Negative for headaches.  Hematological: Does not bruise/bleed easily.  Psychiatric/Behavioral: Negative for confusion.     Physical Exam Updated Vital Signs BP (!) 148/88   Pulse (!) 103   Temp 98.4 F (36.9 C) (Oral)   Resp 16   Ht 1.727 m (5\' 8" )   Wt 86.2 kg (190 lb)   SpO2 98%   BMI 28.89 kg/m   Physical Exam  Constitutional: He is oriented to person, place, and time. He appears well-developed and well-nourished. No distress.  HENT:  Head: Normocephalic and atraumatic.  Mouth/Throat: Oropharynx is clear and moist.  Eyes: Conjunctivae and EOM are normal. Pupils are equal, round, and reactive to light.  Neck: Normal range of motion. Neck supple.  Cardiovascular: Normal rate, regular rhythm and normal heart sounds.  Pulmonary/Chest: Effort normal and breath sounds normal.  Mild wheezing bilaterally  Abdominal: Soft. Bowel sounds are normal. There is  no tenderness.  Musculoskeletal: Normal range of motion. He exhibits no edema.  Neurological: He is alert and oriented to person, place, and time. No cranial nerve deficit or sensory deficit. He exhibits normal muscle tone. Coordination normal.  Skin: Skin is warm.  Nursing note and vitals reviewed.    ED Treatments / Results  Labs (all labs ordered are listed, but only abnormal results are displayed) Labs Reviewed  COMPREHENSIVE METABOLIC PANEL - Abnormal; Notable for the following components:      Result Value   Chloride 99 (*)    Glucose, Bld 110 (*)    ALT 16 (*)    All other components within normal limits  CBC WITH DIFFERENTIAL/PLATELET - Abnormal; Notable for the following  components:   WBC 11.5 (*)    Hemoglobin 12.7 (*)    Neutro Abs 8.6 (*)    Monocytes Absolute 1.1 (*)    All other components within normal limits  LACTIC ACID, PLASMA    EKG  EKG Interpretation  Date/Time:  Thursday January 31 2017 19:13:05 EST Ventricular Rate:  112 PR Interval:  158 QRS Duration: 92 QT Interval:  326 QTC Calculation: 444 R Axis:   82 Text Interpretation:  Sinus tachycardia Right atrial enlargement Borderline ECG No STEMI.  Confirmed by Nanda Quinton [phone]) on 01/31/2017 7:38:55 PM       Radiology Dg Chest 2 View  Result Date: 01/31/2017 CLINICAL DATA:  Dyspnea EXAM: CHEST  2 VIEW COMPARISON:  05/16/2016 chest radiograph. FINDINGS: Stable cardiomediastinal silhouette with normal heart size. No pneumothorax. No pleural effusion. Hyperinflated lungs. No pulmonary edema. No acute consolidative airspace disease. IMPRESSION: 1. No acute cardiopulmonary disease. 2. Hyperinflated lungs, suggesting COPD. Electronically Signed   By: Ilona Sorrel M.D.   On: 01/31/2017 19:32    Procedures Procedures (including critical care time)  Medications Ordered in ED Medications  0.9 %  sodium chloride infusion ( Intravenous New Bag/Given 01/31/17 2003)  methylPREDNISolone sodium succinate  (SOLU-MEDROL) 125 mg/2 mL injection 125 mg (125 mg Intravenous Given 01/31/17 2003)  ipratropium-albuterol (DUONEB) 0.5-2.5 (3) MG/3ML nebulizer solution 3 mL (3 mLs Nebulization Given 01/31/17 1959)     Initial Impression / Assessment and Plan / ED Course  I have reviewed the triage vital signs and the nursing notes.  Pertinent labs & imaging results that were available during my care of the patient were reviewed by me and considered in my medical decision making (see chart for details).     No fevers here.  Vital signs without any significant signs of toxicity other than heart rate up a little bit.  Oxygen saturations on percent.  Patient with some bilateral wheezing and some decreased air movement.  Patient given nebulizer treatment and feeling better.  Lungs clear.  Patient also given a dose of Solu-Medrol.  Lactic acid without significant elevation.  Mild leukocytosis.  But patient just got off a course of steroids a week ago.  No signs of any toxic findings.  Chest x-ray negative for pneumonia.  Overall patient feeling better also reassured.  Patient will be given a 5-day course of steroids.  Patient will return for any new or worse symptoms.  Final Clinical Impressions(s) / ED Diagnoses   Final diagnoses:  COPD exacerbation [facility])    ED Discharge Orders        Ordered    predniSONE (DELTASONE) 10 MG tablet  Daily     01/31/17 2119       Fredia Sorrow, MD 01/31/17 2125

## 2017-01-31 NOTE — ED Triage Notes (Signed)
Pt c/o increased sob and low grade fever that started today.

## 2017-04-16 ENCOUNTER — Other Ambulatory Visit: Payer: Self-pay

## 2017-04-16 ENCOUNTER — Inpatient Hospital Stay (HOSPITAL_COMMUNITY)
Admission: EM | Admit: 2017-04-16 | Discharge: 2017-04-23 | DRG: 190 | Disposition: A | Discharge status: Home / Self Care | Payer: Medicare Other | Attending: Internal Medicine | Admitting: Internal Medicine

## 2017-04-16 ENCOUNTER — Encounter (HOSPITAL_COMMUNITY): Payer: Self-pay | Admitting: *Deleted

## 2017-04-16 ENCOUNTER — Emergency Department (HOSPITAL_COMMUNITY): Payer: Medicare Other

## 2017-04-16 DIAGNOSIS — J9622 Acute and chronic respiratory failure with hypercapnia: Secondary | ICD-10-CM

## 2017-04-16 DIAGNOSIS — E860 Dehydration: Secondary | ICD-10-CM | POA: Diagnosis present

## 2017-04-16 DIAGNOSIS — Z79899 Other long term (current) drug therapy: Secondary | ICD-10-CM | POA: Diagnosis not present

## 2017-04-16 DIAGNOSIS — F17210 Nicotine dependence, cigarettes, uncomplicated: Secondary | ICD-10-CM | POA: Diagnosis present

## 2017-04-16 DIAGNOSIS — F10231 Alcohol dependence with withdrawal delirium: Secondary | ICD-10-CM | POA: Diagnosis not present

## 2017-04-16 DIAGNOSIS — G9341 Metabolic encephalopathy: Secondary | ICD-10-CM | POA: Diagnosis present

## 2017-04-16 DIAGNOSIS — Z7951 Long term (current) use of inhaled steroids: Secondary | ICD-10-CM | POA: Diagnosis not present

## 2017-04-16 DIAGNOSIS — F10931 Alcohol use, unspecified with withdrawal delirium: Secondary | ICD-10-CM

## 2017-04-16 DIAGNOSIS — J9621 Acute and chronic respiratory failure with hypoxia: Secondary | ICD-10-CM | POA: Diagnosis present

## 2017-04-16 DIAGNOSIS — F1010 Alcohol abuse, uncomplicated: Secondary | ICD-10-CM | POA: Diagnosis not present

## 2017-04-16 DIAGNOSIS — E7800 Pure hypercholesterolemia, unspecified: Secondary | ICD-10-CM | POA: Diagnosis present

## 2017-04-16 DIAGNOSIS — J441 Chronic obstructive pulmonary disease with (acute) exacerbation: Secondary | ICD-10-CM | POA: Diagnosis present

## 2017-04-16 DIAGNOSIS — J42 Unspecified chronic bronchitis: Secondary | ICD-10-CM

## 2017-04-16 DIAGNOSIS — Z9981 Dependence on supplemental oxygen: Secondary | ICD-10-CM

## 2017-04-16 DIAGNOSIS — Z720 Tobacco use: Secondary | ICD-10-CM | POA: Diagnosis not present

## 2017-04-16 DIAGNOSIS — E875 Hyperkalemia: Secondary | ICD-10-CM | POA: Diagnosis not present

## 2017-04-16 DIAGNOSIS — J9811 Atelectasis: Secondary | ICD-10-CM | POA: Diagnosis present

## 2017-04-16 DIAGNOSIS — Z7982 Long term (current) use of aspirin: Secondary | ICD-10-CM | POA: Diagnosis not present

## 2017-04-16 LAB — CBC
HCT: 41.5 % (ref 39.0–52.0)
Hemoglobin: 13.2 g/dL (ref 13.0–17.0)
MCH: 29.7 pg (ref 26.0–34.0)
MCHC: 31.8 g/dL (ref 30.0–36.0)
MCV: 93.3 fL (ref 78.0–100.0)
PLATELETS: 327 10*3/uL (ref 150–400)
RBC: 4.45 MIL/uL (ref 4.22–5.81)
RDW: 12.8 % (ref 11.5–15.5)
WBC: 7.4 10*3/uL (ref 4.0–10.5)

## 2017-04-16 LAB — BASIC METABOLIC PANEL
Anion gap: 13 (ref 5–15)
BUN: 13 mg/dL (ref 6–20)
CALCIUM: 9.8 mg/dL (ref 8.9–10.3)
CHLORIDE: 95 mmol/L — AB (ref 101–111)
CO2: 27 mmol/L (ref 22–32)
CREATININE: 0.76 mg/dL (ref 0.61–1.24)
GFR calc Af Amer: >60 mL/min (ref >60)
GFR calc non Af Amer: >60 mL/min (ref >60)
GLUCOSE: 98 mg/dL (ref 65–99)
Potassium: 4.4 mmol/L (ref 3.5–5.1)
Sodium: 135 mmol/L (ref 135–145)

## 2017-04-16 LAB — TROPONIN I: Troponin I: <0.03 ng/mL (ref <0.03)

## 2017-04-16 LAB — D-DIMER, QUANTITATIVE: D-Dimer, Quant: <0.27 ug/mL-FEU (ref 0.00–0.50)

## 2017-04-16 LAB — BRAIN NATRIURETIC PEPTIDE: B NATRIURETIC PEPTIDE 5: 23 pg/mL (ref 0.0–100.0)

## 2017-04-16 MED ORDER — ONDANSETRON HCL 4 MG PO TABS: 4.0000 mg | ORAL_TABLET | Freq: Four times a day (QID) | ORAL | Status: DC | PRN

## 2017-04-16 MED ORDER — LORAZEPAM 2 MG/ML IJ SOLN
1.0000 mg | Freq: Four times a day (QID) | INTRAMUSCULAR | Status: AC | PRN
  Administered 2017-04-18 – 2017-04-19 (×5): 1 mg via INTRAVENOUS
  Filled 2017-04-16 (×6): qty 1

## 2017-04-16 MED ORDER — SALINE SPRAY 0.65 % NA SOLN: 1.0000 | NASAL | Status: DC | PRN

## 2017-04-16 MED ORDER — VITAMIN B-1 100 MG PO TABS
100.0000 mg | ORAL_TABLET | Freq: Every day | ORAL | Status: DC
  Administered 2017-04-17 – 2017-04-18 (×2): 100 mg via ORAL
  Filled 2017-04-16 (×2): qty 1

## 2017-04-16 MED ORDER — PREDNISONE 20 MG PO TABS
60.0000 mg | ORAL_TABLET | Freq: Every day | ORAL | Status: DC
  Administered 2017-04-17: 60 mg via ORAL
  Filled 2017-04-16: qty 3

## 2017-04-16 MED ORDER — THIAMINE HCL 100 MG/ML IJ SOLN: 100.0000 mg | Freq: Every day | INTRAMUSCULAR | Status: DC

## 2017-04-16 MED ORDER — ALBUTEROL SULFATE (2.5 MG/3ML) 0.083% IN NEBU
5.0000 mg | INHALATION_SOLUTION | Freq: Once | RESPIRATORY_TRACT | Status: AC
  Administered 2017-04-16: 5 mg via RESPIRATORY_TRACT
  Filled 2017-04-16: qty 6

## 2017-04-16 MED ORDER — SODIUM CHLORIDE 0.9 % IV SOLN
1.0000 g | INTRAVENOUS | Status: DC
  Administered 2017-04-16: 1 g via INTRAVENOUS
  Filled 2017-04-16 (×2): qty 10

## 2017-04-16 MED ORDER — ENOXAPARIN SODIUM 40 MG/0.4ML ~~LOC~~ SOLN
40.0000 mg | SUBCUTANEOUS | Status: DC
  Administered 2017-04-16 – 2017-04-22 (×7): 40 mg via SUBCUTANEOUS
  Filled 2017-04-16 (×7): qty 0.4

## 2017-04-16 MED ORDER — MAGNESIUM SULFATE 2 GM/50ML IV SOLN
2.0000 g | Freq: Once | INTRAVENOUS | Status: AC
  Administered 2017-04-16: 2 g via INTRAVENOUS
  Filled 2017-04-16: qty 50

## 2017-04-16 MED ORDER — LORAZEPAM 0.5 MG PO TABS
0.5000 mg | ORAL_TABLET | Freq: Once | ORAL | Status: AC
  Administered 2017-04-16: 0.5 mg via ORAL
  Filled 2017-04-16: qty 1

## 2017-04-16 MED ORDER — PREDNISONE 50 MG PO TABS
60.0000 mg | ORAL_TABLET | Freq: Once | ORAL | Status: AC
  Administered 2017-04-16: 60 mg via ORAL
  Filled 2017-04-16: qty 1

## 2017-04-16 MED ORDER — SODIUM CHLORIDE 0.9 % IV SOLN
INTRAVENOUS | Status: AC
  Administered 2017-04-16: 23:00:00 via INTRAVENOUS

## 2017-04-16 MED ORDER — ALBUTEROL (5 MG/ML) CONTINUOUS INHALATION SOLN
10.0000 mg/h | INHALATION_SOLUTION | Freq: Once | RESPIRATORY_TRACT | Status: AC
  Administered 2017-04-16: 10 mg/h via RESPIRATORY_TRACT
  Filled 2017-04-16: qty 20

## 2017-04-16 MED ORDER — ACETAMINOPHEN 325 MG PO TABS
650.0000 mg | ORAL_TABLET | Freq: Four times a day (QID) | ORAL | Status: DC | PRN
Start: 2017-04-16 — End: 2017-04-23

## 2017-04-16 MED ORDER — FOLIC ACID 1 MG PO TABS
1.0000 mg | ORAL_TABLET | Freq: Every day | ORAL | Status: DC
  Administered 2017-04-17 – 2017-04-23 (×7): 1 mg via ORAL
  Filled 2017-04-16 (×8): qty 1

## 2017-04-16 MED ORDER — LORAZEPAM 1 MG PO TABS
1.0000 mg | ORAL_TABLET | Freq: Four times a day (QID) | ORAL | Status: AC | PRN
  Administered 2017-04-17 – 2017-04-18 (×3): 1 mg via ORAL
  Filled 2017-04-16 (×3): qty 1

## 2017-04-16 MED ORDER — ONDANSETRON HCL 4 MG/2ML IJ SOLN: 4.0000 mg | Freq: Four times a day (QID) | INTRAMUSCULAR | Status: DC | PRN

## 2017-04-16 MED ORDER — IPRATROPIUM-ALBUTEROL 0.5-2.5 (3) MG/3ML IN SOLN
3.0000 mL | Freq: Four times a day (QID) | RESPIRATORY_TRACT | Status: DC
  Administered 2017-04-16 – 2017-04-23 (×25): 3 mL via RESPIRATORY_TRACT
  Filled 2017-04-16 (×28): qty 3

## 2017-04-16 MED ORDER — ACETAMINOPHEN 650 MG RE SUPP: 650.0000 mg | Freq: Four times a day (QID) | RECTAL | Status: DC | PRN

## 2017-04-16 MED ORDER — ASPIRIN EC 81 MG PO TBEC
81.0000 mg | DELAYED_RELEASE_TABLET | Freq: Every day | ORAL | Status: DC
  Administered 2017-04-17 – 2017-04-23 (×7): 81 mg via ORAL
  Filled 2017-04-16 (×7): qty 1

## 2017-04-16 MED ORDER — GUAIFENESIN ER 600 MG PO TB12
1200.0000 mg | ORAL_TABLET | Freq: Two times a day (BID) | ORAL | Status: DC
  Administered 2017-04-16 – 2017-04-18 (×4): 1200 mg via ORAL
  Filled 2017-04-16 (×4): qty 2

## 2017-04-16 MED ORDER — ADULT MULTIVITAMIN W/MINERALS CH
1.0000 | ORAL_TABLET | Freq: Every day | ORAL | Status: DC
  Administered 2017-04-17 – 2017-04-23 (×7): 1 via ORAL
  Filled 2017-04-16 (×7): qty 1

## 2017-04-16 MED ORDER — ALBUTEROL SULFATE (2.5 MG/3ML) 0.083% IN NEBU
2.5000 mg | INHALATION_SOLUTION | RESPIRATORY_TRACT | Status: DC | PRN
  Administered 2017-04-17 – 2017-04-22 (×7): 2.5 mg via RESPIRATORY_TRACT
  Filled 2017-04-16 (×7): qty 3

## 2017-04-16 NOTE — ED Notes (Signed)
Resp paged for breathing treatment.  

## 2017-04-16 NOTE — H&P (Addendum)
[facility] History and Physical  FRIEND DORFMAN DGU:[id number] DOB: 29-Apr-1955 DOA: 04/16/2017  Referring physician:  PCP: Claggett, Neita Goodnight, PA-C   Chief Complaint: "I saw my oxygen drop to 80."  HPI: Robert [name] is a 62 y.o. male with past medical history significant for COPD, alcohol abuse and depression who presents the emergency room with shortness of breath.  Patient states he has been feeling ill for about a week.  Normally has a rare occasions spit up clear or white sputum.  Over the last 3 days sputum has been yellow.  Denies any fevers.  Denies any sick contacts.  Tried Mucinex which gave him some relief.  Has several episodes where he would only go a few feet and then got so winded he could barely walk or stand.  ED course: Patient given breathing treatments and steroids.  No improvement marked respiratory distress, hospitalist consulted for admission.  Review of Systems:  As per HPI otherwise 10 point review of systems negative.    Past Medical History:  Diagnosis Date  . Alcohol addiction ([place])   . Anxiety   . Bipolar 1 disorder ([place])   . COPD (chronic obstructive pulmonary disease) ([place])   . Depression   . High cholesterol   . Panic   . PTSD (post-traumatic stress disorder)    Past Surgical History:  Procedure Laterality Date  . TONSILLECTOMY     Social History:  reports that he has been smoking cigarettes.  He has a 22.50 pack-year smoking history. He has never used smokeless tobacco. He reports that he does not drink alcohol or use drugs.  No Known Allergies  Family History  Problem Relation Age of Onset  . Coronary artery disease Mother   . Coronary artery disease Father      Prior to Admission medications   Medication Sig Start Date End Date Taking? Authorizing Provider  albuterol (PROVENTIL HFA;VENTOLIN HFA) 108 (90 BASE) MCG/ACT inhaler Inhale 2 puffs into the lungs every 4 (four) hours as needed for wheezing or shortness of breath. 09/16/11  05/16/17 Yes Jola Schmidt, MD  aspirin EC 81 MG tablet Take 1 tablet (81 mg total) by mouth daily. 02/17/16  Yes Isaac Bliss, Rayford Halsted, MD  diphenhydrAMINE (BENADRYL) 25 mg capsule Take 25 mg by mouth at bedtime as needed for sleep.    Yes [provider]  ipratropium-albuterol (DUONEB) 0.5-2.5 (3) MG/3ML SOLN Take 3 mLs by nebulization every 6 (six) hours as needed (shortness of breath).    Yes [provider]  Naphazoline HCl (CLEAR EYES OP) Place 2 drops into both eyes daily as needed (red eyes).   Yes [provider]  OXYGEN Inhale 3-3.5 L into the lungs daily.    Yes [provider]  sodium chloride (OCEAN) 0.65 % SOLN nasal spray Place 1 spray into both nostrils as needed for congestion. 11/20/14  Yes Forde Dandy, MD  TRELEGY ELLIPTA 100-62.5-25 MCG/INH AEPB Inhale 1 puff into the lungs every evening. 04/08/17  Yes [provider]  predniSONE (DELTASONE) 10 MG tablet Take 4 tablets (40 mg total) by mouth daily. Patient not taking: Reported on 04/16/2017 01/31/17   Fredia Sorrow, MD   Physical Exam: Vitals:   04/16/17 2000 04/16/17 2017 04/16/17 2035 04/16/17 2121  BP:  137/84 (!) 153/72   Pulse: (!) 103 (!) 103 (!) 112   Resp:  20    Temp:  97.9 F (36.6 C) 97.6 F (36.4 C)   TempSrc:  Oral Oral  SpO2: 94% 94% 93% 92%  Weight:      Height:        Wt Readings from Last 3 Encounters:  04/16/17 86.2 kg (190 lb)  01/31/17 86.2 kg (190 lb)  02/17/16 84.2 kg (185 lb 11.2 oz)    General:  Appears calm and comfortable; alert and oriented x3; dry moth and lips Eyes:  PERRL, EOMI, normal lids, iris ENT:  grossly normal hearing, lips & tongue Neck:  no LAD, masses or thyromegaly Cardiovascular:  RRR, no m/r/g.  Lower extremity edema doing Respiratory: Tight wheezing, diffuse, increased work of breathing, use of accessory muscles, speaking in short sentences Abdomen:  soft, ntnd Skin:  no rash or induration seen on limited  exam Musculoskeletal:  grossly normal tone BUE/BLE Psychiatric:  grossly normal mood and affect, speech fluent and appropriate Neurologic:  CN 2-12 grossly intact, moves all extremities in coordinated fashion.          Labs on Admission:  Basic Metabolic Panel: Recent Labs  Lab 04/16/17 1546  NA 135  K 4.4  CL 95*  CO2 27  GLUCOSE 98  BUN 13  CREATININE 0.76  CALCIUM 9.8   Liver Function Tests: No results for input(s): AST, ALT, ALKPHOS, BILITOT, PROT, ALBUMIN in the last 168 hours. No results for input(s): LIPASE, AMYLASE in the last 168 hours. No results for input(s): AMMONIA in the last 168 hours. CBC: Recent Labs  Lab 04/16/17 1546  WBC 7.4  HGB 13.2  HCT 41.5  MCV 93.3  PLT 327   Cardiac Enzymes: Recent Labs  Lab 04/16/17 1609  TROPONINI <0.03    BNP (last 3 results) Recent Labs    04/16/17 1607  BNP 23.0    ProBNP (last 3 results) No results for input(s): PROBNP in the last 8760 hours.   Serum creatinine: 0.76 mg/dL 04/16/17 1546 Estimated creatinine clearance: 102.2 mL/min  CBG: No results for input(s): GLUCAP in the last 168 hours.  Radiological Exams on Admission: Dg Chest Port 1 View  Result Date: 04/16/2017 CLINICAL DATA:  Short of breath EXAM: PORTABLE CHEST 1 VIEW COMPARISON:  01/31/2017 FINDINGS: Hyperinflation with emphysematous disease. No focal airspace disease or pleural effusion. Normal cardiomediastinal silhouette. Aortic atherosclerosis. No pneumothorax. IMPRESSION: Hyperinflation with emphysematous disease. Electronically Signed   By: Donavan Foil M.D.   On: 04/16/2017 16:30    EKG: Independently reviewed. Sinus tach. No stemi.  Assessment/Plan Active Problems:   COPD exacerbation (HCC)  COPD AE Scheduled DuoNeb's When necessary albuterol Antibiotic-rocephin Oxygen therapy Continuous pulse oximetry QD steroids Mucinex Flutter valve Cont pulse eox Hold TreLegy  Mil dehydration IVF  DOE/LE Edema ECHO in  AM  ETOH abuse hx CIWA protocol  Code Status: FC  DVT Prophylaxis: lovenox Family Communication: none available Disposition Plan: Pending Improvement  Status: inpt temd surg  Elwin Mocha, MD Family Medicine [facility] [email or web address] Password [email or web address]

## 2017-04-16 NOTE — ED Triage Notes (Addendum)
Pt c/o difficulty breathing, Avyana Puffenbarger colored thick productive cough and weakness x 1 week. Pt has inspiratory and expiratory wheezing in triage. Pt wears 3L of O2 via [place] at home. Pt reports when he walks short distances at home his O2 sat drops down into the 80's. Pt has bilateral pitting LE edema.

## 2017-04-16 NOTE — ED Provider Notes (Signed)
[facility] EMERGENCY DEPARTMENT Provider Note   CSN: [id number] Arrival date & time: 04/16/17  1508     History   Chief Complaint Chief Complaint  Patient presents with  . Shortness of Breath    HPI Robert [name] is a 62 y.o. male.  Complains of shortness of breath progressively worsening over the past week accompanied by cough productive of frothy, sticky thick white sputum.  Denies any fever denies nausea or vomiting.  Treated with DuoNeb with partial relief.  He also reports bilateral leg swelling for the past 2 days.  Shortness breath is worse with exertion and improved with rest..  No other associated symptoms  HPI  Past Medical History:  Diagnosis Date  . Alcohol addiction ([place])   . Anxiety   . Bipolar 1 disorder ([place])   . COPD (chronic obstructive pulmonary disease) ([place])   . Depression   . High cholesterol   . Panic   . PTSD (post-traumatic stress disorder)     Patient Active Problem List   Diagnosis Date Noted  . TIA (transient ischemic attack) 02/16/2016  . COPD exacerbation ([place]) 10/31/2015  . COPD (chronic obstructive pulmonary disease) ([place])   . High cholesterol   . PTSD (post-traumatic stress disorder)   . Alcohol addiction ([place])   . Bipolar 1 disorder [facility])     Past Surgical History:  Procedure Laterality Date  . TONSILLECTOMY          Home Medications    Prior to Admission medications   Medication Sig Start Date End Date Taking? Authorizing Provider  albuterol (PROVENTIL HFA) 108 (90 BASE) MCG/ACT inhaler Inhale 2 puffs into the lungs every 6 (six) hours as needed for wheezing or shortness of breath.    [provider]  albuterol (PROVENTIL HFA;VENTOLIN HFA) 108 (90 BASE) MCG/ACT inhaler Inhale 2 puffs into the lungs every 4 (four) hours as needed for wheezing or shortness of breath. 09/16/11 05/16/17  Jola Schmidt, MD  albuterol (PROVENTIL HFA;VENTOLIN HFA) 108 (90 Base) MCG/ACT inhaler Inhale 2 puffs into the lungs every 4 (four)  hours as needed for wheezing or shortness of breath. 05/16/16   Francine Graven, DO  albuterol (PROVENTIL) (2.5 MG/3ML) 0.083% nebulizer solution Take 2.5 mg by nebulization every 4 (four) hours as needed. 07/08/11 05/16/17  Lily Kocher, PA-C  aspirin EC 81 MG tablet Take 1 tablet (81 mg total) by mouth daily. 02/17/16   Isaac Bliss, Rayford Halsted, MD  budesonide-formoterol [facility]) 160-4.5 MCG/ACT inhaler Inhale 2 puffs into the lungs 2 (two) times daily.    [provider]  budesonide-formoterol (SYMBICORT) 160-4.5 MCG/ACT inhaler Inhale 2 puffs into the lungs 2 (two) times daily. 05/16/16   Francine Graven, DO  diphenhydrAMINE (BENADRYL) 25 mg capsule Take 25 mg by mouth at bedtime as needed for sleep.     [provider]  doxycycline (VIBRA-TABS) 100 MG tablet Take 1 tablet (100 mg total) by mouth 2 (two) times daily. 05/16/16   Francine Graven, DO  DULoxetine (CYMBALTA) 60 MG capsule Take 60 mg by mouth daily.    [provider]  haloperidol (HALDOL) 0.5 MG tablet Take 1 tablet (0.5 mg total) by mouth at bedtime. 02/17/16   Isaac Bliss, Rayford Halsted, MD  ipratropium-albuterol (DUONEB) 0.5-2.5 (3) MG/3ML SOLN Take 3 mLs by nebulization every 6 (six) hours as needed (shortness of breath).     [provider]  lithium carbonate 300 MG capsule Take 1 capsule (300 mg total) by mouth 2 (two) times daily  with a meal. 02/17/16   Isaac Bliss, Rayford Halsted, MD  Naphazoline HCl (CLEAR EYES OP) Place 2 drops into both eyes daily as needed (red eyes).    [provider]  OXYGEN Inhale 2-3 L into the lungs daily. 3l while active and 2l while resting    [provider]  predniSONE (DELTASONE) 10 MG tablet Take 4 tablets (40 mg total) by mouth daily. 01/31/17   Fredia Sorrow, MD  predniSONE (DELTASONE) 20 MG tablet Take 2 tablets (40 mg total) by mouth daily. 05/16/16   Francine Graven, DO  sodium chloride (OCEAN) 0.65 % SOLN nasal spray Place 1 spray into  both nostrils as needed for congestion. 11/20/14   Forde Dandy, MD   Oxygen 3 L nasal cannula Family History Family History  Problem Relation Age of Onset  . Coronary artery disease Mother   . Coronary artery disease Father     Social History Social History   Tobacco Use  . Smoking status: Current Some Day Smoker    Packs/day: 0.50    Years: 45.00    Pack years: 22.50    Types: Cigarettes  . Smokeless tobacco: Never Used  Substance Use Topics  . Alcohol use: No    Frequency: Never    Comment: heavily  . Drug use: No  Because of alcohol denies drug use   Allergies   Patient has no known allergies.   Review of Systems Review of Systems  Constitutional: Negative.   HENT: Negative.   Respiratory: Positive for cough and shortness of breath.   Cardiovascular: Positive for leg swelling.  Gastrointestinal: Negative.   Musculoskeletal: Negative.   Skin: Negative.   Neurological: Negative.   Psychiatric/Behavioral: Negative.   All other systems reviewed and are negative.    Physical Exam Updated Vital Signs BP (!) 141/83   Pulse (!) 106   Temp 98.2 F (36.8 C) (Oral)   Resp 18   Ht 5\' 8"  (1.727 m)   Wt 86.2 kg (190 lb)   SpO2 97%   BMI 28.89 kg/m   Physical Exam  Constitutional: He is oriented to person, place, and time. He appears well-developed and well-nourished.  HENT:  Head: Normocephalic and atraumatic.  Eyes: Pupils are equal, round, and reactive to light. Conjunctivae are normal.  Neck: Neck supple. No tracheal deviation present. No thyromegaly present.  Cardiovascular: Regular rhythm.  No murmur heard. Mildly tachycardic  Pulmonary/Chest: Effort normal. No respiratory distress. He has wheezes.  Expiratory wheezes, speaks in paragraphs  Abdominal: Soft. Bowel sounds are normal. He exhibits no distension. There is no tenderness.  Musculoskeletal: Normal range of motion. He exhibits no edema or tenderness.  No peripheral edema.  Neurological: He  is alert and oriented to person, place, and time. Coordination normal.  Skin: Skin is warm and dry. No rash noted.  Psychiatric: He has a normal mood and affect.  Nursing note and vitals reviewed.    ED Treatments / Results  Labs (all labs ordered are listed, but only abnormal results are displayed) Labs Reviewed  CBC  BASIC METABOLIC PANEL  BRAIN NATRIURETIC PEPTIDE  TROPONIN I    EKG EKG Interpretation  Date/Time:  Tuesday April 16 2017 15:42:52 EDT Ventricular Rate:  104 PR Interval:    QRS Duration: 92 QT Interval:  349 QTC Calculation: 459 R Axis:   76 Text Interpretation:  Sinus tachycardia Nonspecific T abnormalities, lateral leads No significant change since last tracing Confirmed by Orlie Dakin [phone]) on 04/16/2017 3:56:26 PM  Radiology No results found.  Procedures Procedures (including critical care time)  Medications Ordered in ED Medications  predniSONE (DELTASONE) tablet 60 mg (has no administration in time range)  albuterol (PROVENTIL,VENTOLIN) solution continuous neb (has no administration in time range)  albuterol (PROVENTIL) (2.5 MG/3ML) 0.083% nebulizer solution 5 mg (5 mg Nebulization Given 04/16/17 1611)    Chest x-ray reviewed by me Results for orders placed or performed during the hospital encounter of 12/45/80  Basic metabolic panel  Result Value Ref Range   Sodium 135 135 - 145 mmol/L   Potassium 4.4 3.5 - 5.1 mmol/L   Chloride 95 (L) 101 - 111 mmol/L   CO2 27 22 - 32 mmol/L   Glucose, Bld 98 65 - 99 mg/dL   BUN 13 6 - 20 mg/dL   Creatinine, Ser 0.76 0.61 - 1.24 mg/dL   Calcium 9.8 8.9 - 10.3 mg/dL   GFR calc non Af Amer >60 >60 mL/min   GFR calc Af Amer >60 >60 mL/min   Anion gap 13 5 - 15  CBC  Result Value Ref Range   WBC 7.4 4.0 - 10.5 K/uL   RBC 4.45 4.22 - 5.81 MIL/uL   Hemoglobin 13.2 13.0 - 17.0 g/dL   HCT 41.5 39.0 - 52.0 %   MCV 93.3 78.0 - 100.0 fL   MCH 29.7 26.0 - 34.0 pg   MCHC 31.8 30.0 - 36.0 g/dL   RDW 12.8  11.5 - 15.5 %   Platelets 327 150 - 400 K/uL  Brain natriuretic peptide  Result Value Ref Range   B Natriuretic Peptide 23.0 0.0 - 100.0 pg/mL  Troponin I  Result Value Ref Range   Troponin I <0.03 <0.03 ng/mL  D-dimer, quantitative (not at [facility])  Result Value Ref Range   D-Dimer, Quant <0.27 0.00 - 0.50 ug/mL-FEU   Dg Chest Port 1 View  Result Date: 04/16/2017 CLINICAL DATA:  Short of breath EXAM: PORTABLE CHEST 1 VIEW COMPARISON:  01/31/2017 FINDINGS: Hyperinflation with emphysematous disease. No focal airspace disease or pleural effusion. Normal cardiomediastinal silhouette. Aortic atherosclerosis. No pneumothorax. IMPRESSION: Hyperinflation with emphysematous disease. Electronically Signed   By: Donavan Foil M.D.   On: 04/16/2017 16:30   Initial Impression / Assessment and Plan / ED Course  I have reviewed the triage vital signs and the nursing notes.  Pertinent labs & imaging results that were available during my care of the patient were reviewed by me and considered in my medical decision making (see chart for details).     5:25 PM after treatment with nebulization for 1 hour with albuterol and Atrovent, prednisone patient states his breathing is not improved.  He is requesting medicine for anxiety.  Ativan ordered.  He denies that he is feeling tired to breathe or as if he may stop breathing.  He speaks in paragraphs.  Lung exam with prolonged expiratory phase.  His breath sounds diffusely I have also ordered intravenous magnesium sulfate.  Clinically patient with COPD exacerbation.  Pretest clinical probability for pulmonary embolism.  Negative d-dimer.  No signs of acute coronary syndrome or congestive heart failure I have consulted hospitalist for admission  I counseled patient for 5 minutes on smoking cessation . hospitalist service consulted for admission Final Clinical Impressions(s) / ED Diagnoses  Dx #1 COPD exacerbation #2 anxiety #3 elevated blood pressure #4 tobacco  abuse Final diagnoses:  None  CRITICAL CARE Performed by: Orlie Dakin Total critical care time: 60 minutes Critical care time was exclusive of separately  billable procedures and treating other patients. Critical care was necessary to treat or prevent imminent or life-threatening deterioration. Critical care was time spent personally by me on the following activities: development of treatment plan with patient and/or surrogate as well as nursing, discussions with consultants, evaluation of patient's response to treatment, examination of patient, obtaining history from patient or surrogate, ordering and performing treatments and interventions, ordering and review of laboratory studies, ordering and review of radiographic studies, pulse oximetry and re-evaluation of patient's condition.  ED Discharge Orders    None       Orlie Dakin, MD 04/16/17 [phone]

## 2017-04-17 DIAGNOSIS — Z720 Tobacco use: Secondary | ICD-10-CM

## 2017-04-17 DIAGNOSIS — F17210 Nicotine dependence, cigarettes, uncomplicated: Secondary | ICD-10-CM

## 2017-04-17 DIAGNOSIS — J9621 Acute and chronic respiratory failure with hypoxia: Secondary | ICD-10-CM

## 2017-04-17 LAB — BASIC METABOLIC PANEL
ANION GAP: 10 (ref 5–15)
BUN: 14 mg/dL (ref 6–20)
CHLORIDE: 100 mmol/L — AB (ref 101–111)
CO2: 29 mmol/L (ref 22–32)
Calcium: 9.5 mg/dL (ref 8.9–10.3)
Creatinine, Ser: 0.73 mg/dL (ref 0.61–1.24)
GFR calc Af Amer: >60 mL/min (ref >60)
GFR calc non Af Amer: >60 mL/min (ref >60)
GLUCOSE: 140 mg/dL — AB (ref 65–99)
POTASSIUM: 4.8 mmol/L (ref 3.5–5.1)
Sodium: 139 mmol/L (ref 135–145)

## 2017-04-17 LAB — CBC
HEMATOCRIT: 39.2 % (ref 39.0–52.0)
HEMOGLOBIN: 12.6 g/dL — AB (ref 13.0–17.0)
MCH: 29.9 pg (ref 26.0–34.0)
MCHC: 32.1 g/dL (ref 30.0–36.0)
MCV: 92.9 fL (ref 78.0–100.0)
Platelets: 316 10*3/uL (ref 150–400)
RBC: 4.22 MIL/uL (ref 4.22–5.81)
RDW: 12.5 % (ref 11.5–15.5)
WBC: 4.3 10*3/uL (ref 4.0–10.5)

## 2017-04-17 LAB — RESPIRATORY PANEL BY PCR
ADENOVIRUS-RVPPCR: NOT DETECTED
Bordetella pertussis: NOT DETECTED
CHLAMYDOPHILA PNEUMONIAE-RVPPCR: NOT DETECTED
CORONAVIRUS HKU1-RVPPCR: NOT DETECTED
CORONAVIRUS NL63-RVPPCR: NOT DETECTED
CORONAVIRUS OC43-RVPPCR: NOT DETECTED
Coronavirus 229E: NOT DETECTED
INFLUENZA A-RVPPCR: NOT DETECTED
Influenza B: NOT DETECTED
Metapneumovirus: NOT DETECTED
Mycoplasma pneumoniae: NOT DETECTED
PARAINFLUENZA VIRUS 1-RVPPCR: NOT DETECTED
PARAINFLUENZA VIRUS 3-RVPPCR: NOT DETECTED
PARAINFLUENZA VIRUS 4-RVPPCR: NOT DETECTED
Parainfluenza Virus 2: NOT DETECTED
RHINOVIRUS / ENTEROVIRUS - RVPPCR: NOT DETECTED
Respiratory Syncytial Virus: NOT DETECTED

## 2017-04-17 LAB — HEPATIC FUNCTION PANEL
ALBUMIN: 4.1 g/dL (ref 3.5–5.0)
ALK PHOS: 46 U/L (ref 38–126)
ALT: 19 U/L (ref 17–63)
AST: 22 U/L (ref 15–41)
BILIRUBIN TOTAL: 0.7 mg/dL (ref 0.3–1.2)
Total Protein: 7.3 g/dL (ref 6.5–8.1)

## 2017-04-17 MED ORDER — BUDESONIDE 0.5 MG/2ML IN SUSP
0.5000 mg | Freq: Two times a day (BID) | RESPIRATORY_TRACT | Status: DC
  Administered 2017-04-17 – 2017-04-23 (×13): 0.5 mg via RESPIRATORY_TRACT
  Filled 2017-04-17 (×14): qty 2

## 2017-04-17 MED ORDER — AZITHROMYCIN 250 MG PO TABS
500.0000 mg | ORAL_TABLET | Freq: Every day | ORAL | Status: AC
  Administered 2017-04-17 – 2017-04-22 (×6): 500 mg via ORAL
  Filled 2017-04-17 (×6): qty 2

## 2017-04-17 MED ORDER — METHYLPREDNISOLONE SODIUM SUCC 125 MG IJ SOLR
60.0000 mg | Freq: Four times a day (QID) | INTRAMUSCULAR | Status: DC
  Administered 2017-04-17 – 2017-04-22 (×19): 60 mg via INTRAVENOUS
  Filled 2017-04-17 (×18): qty 2

## 2017-04-17 NOTE — Progress Notes (Signed)
PROGRESS NOTE  COUPER JUNCAJ NAT:[id number] DOB: 1955/05/17 DOA: 04/16/2017 PCP: Claggett, Elin, PA-C  Brief History:  62 year old male with a history of COPD, chronic respiratory failure on 3 L, anxiety, and tobacco abuse presenting with 5-day history of increasing coughing, shortness of breath, and generalized weakness.  The patient denied any fevers, chills, chest pain, nausea, vomiting, diarrhea.  Patient states that he checked his oxygen saturation at home and noted it was in the 80s.  He has had some increase in his lower extremity edema.  He denies any hemoptysis, abdominal pain, dysuria, hematuria.  The patient continues to smoke approximately 3-4 cigarettes every 2-3 days.  He has a 50-pack-year history.  The patient was given intravenous steroids and bronchodilators and admitted for treatment for COPD exacerbation.  Assessment/Plan: COPD exacerbation -Start Pulmicort -Continue duo nebs -Start azithromycin -Viral respiratory panel  Acute on Chronic respiratory failure with hypoxia -Normally on 3 L at home -Presently stable on 3 L -Personally reviewed EKG-sinus rhythm, nonspecific T wave change -Personally reviewed chest x-ray--hyperinflation without infiltrates or edema  Tobacco abuse -Tobacco cessation discussed -I have discussed tobacco cessation with the patient.  I have counseled the patient regarding the negative impacts of continued tobacco use including but not limited to lung cancer, COPD, and cardiovascular disease.  I have discussed alternatives to tobacco and modalities that may help facilitate tobacco cessation including but not limited to biofeedback, hypnosis, and medications.  Total time spent with tobacco counseling was 4 minutes.  Alcohol abuse, in remission -CIWA  Disposition Plan:   Home in 2-3 days  Family Communication:  No Family at bedside  Consultants:  none  Code Status:  FULL   DVT Prophylaxis:  [place] Lovenox   Procedures: As Listed  in Progress Note Above  Antibiotics: None    Subjective: Patient states that his breathing is not much better than yesterday.  He still has dyspnea with minimal exertion.  He denies any chest pain, nausea, vomiting, diarrhea, abdominal pain, dysuria, hematuria.  There is no headache or neck pain.  Objective: Vitals:   04/17/17 0500 04/17/17 0601 04/17/17 0614 04/17/17 0729  BP:  (!) 148/94    Pulse:  (!) 103    Resp:      Temp:  97.8 F (36.6 C)    TempSrc:  Oral    SpO2:  96% 95% 95%  Weight: 82.7 kg (182 lb 6.4 oz)     Height:        Intake/Output Summary (Last 24 hours) at 04/17/2017 0902 Last data filed at 04/17/2017 0400 Gross per 24 hour  Intake 1006.25 ml  Output 800 ml  Net 206.25 ml   Weight change:  Exam:   General:  Pt is alert, follows commands appropriately, not in acute distress  HEENT: No icterus, No thrush, No neck mass, [place]/AT  Cardiovascular: RRR, S1/S2, no rubs, no gallops  Respiratory: Bilateral expiratory wheeze.  Bilateral scattered rales.  Abdomen: Soft/+BS, non tender, non distended, no guarding  Extremities: 1 + LE edema, No lymphangitis, No petechiae, No rashes, no synovitis   Data Reviewed: I have personally reviewed following labs and imaging studies Basic Metabolic Panel: Recent Labs  Lab 04/16/17 1546 04/17/17 0539  NA 135 139  K 4.4 4.8  CL 95* 100*  CO2 27 29  GLUCOSE 98 140*  BUN 13 14  CREATININE 0.76 0.73  CALCIUM 9.8 9.5   Liver Function Tests: No results for input(s): AST,  ALT, ALKPHOS, BILITOT, PROT, ALBUMIN in the last 168 hours. No results for input(s): LIPASE, AMYLASE in the last 168 hours. No results for input(s): AMMONIA in the last 168 hours. Coagulation Profile: No results for input(s): INR, PROTIME in the last 168 hours. CBC: Recent Labs  Lab 04/16/17 1546 04/17/17 0539  WBC 7.4 4.3  HGB 13.2 12.6*  HCT 41.5 39.2  MCV 93.3 92.9  PLT 327 316   Cardiac Enzymes: Recent Labs  Lab 04/16/17 1609    TROPONINI <0.03   BNP: Invalid input(s): POCBNP CBG: No results for input(s): GLUCAP in the last 168 hours. HbA1C: No results for input(s): HGBA1C in the last 72 hours. Urine analysis:    Component Value Date/Time   COLORURINE STRAW (A) 02/16/2016 2023   APPEARANCEUR CLEAR 02/16/2016 2023   APPEARANCEUR Clear 11/14/2013 1034   LABSPEC 1.009 02/16/2016 2023   LABSPEC 1.003 11/14/2013 1034   PHURINE 6.0 02/16/2016 2023   GLUCOSEU NEGATIVE 02/16/2016 2023   GLUCOSEU Negative 11/14/2013 1034   HGBUR SMALL (A) 02/16/2016 2023   BILIRUBINUR NEGATIVE 02/16/2016 2023   BILIRUBINUR Negative 11/14/2013 [place] 02/16/2016 2023   PROTEINUR NEGATIVE 02/16/2016 2023   NITRITE NEGATIVE 02/16/2016 2023   LEUKOCYTESUR NEGATIVE 02/16/2016 2023   LEUKOCYTESUR Negative 11/14/2013 1034   Sepsis Labs: @LABRCNTIP (procalcitonin:4,lacticidven:4) )No results found for this or any previous visit (from the past 240 hour(s)).   Scheduled Meds: . aspirin EC  81 mg Oral Daily  . azithromycin  500 mg Oral Daily  . budesonide (PULMICORT) nebulizer solution  0.5 mg Nebulization BID  . enoxaparin (LOVENOX) injection  40 mg Subcutaneous Q24H  . folic acid  1 mg Oral Daily  . guaiFENesin  1,200 mg Oral BID  . ipratropium-albuterol  3 mL Nebulization Q6H  . methylPREDNISolone (SOLU-MEDROL) injection  60 mg Intravenous Q6H  . multivitamin with minerals  1 tablet Oral Daily  . thiamine  100 mg Oral Daily   Or  . thiamine  100 mg Intravenous Daily   Continuous Infusions:  Procedures/Studies: Dg Chest Port 1 View  Result Date: 04/16/2017 CLINICAL DATA:  Short of breath EXAM: PORTABLE CHEST 1 VIEW COMPARISON:  01/31/2017 FINDINGS: Hyperinflation with emphysematous disease. No focal airspace disease or pleural effusion. Normal cardiomediastinal silhouette. Aortic atherosclerosis. No pneumothorax. IMPRESSION: Hyperinflation with emphysematous disease. Electronically Signed   By: Donavan Foil M.D.   On: 04/16/2017 16:30    Orson Eva, DO  [facility] Pager [phone]  If 7PM-7AM, please contact night-coverage [email or web address] Password [email or web address] 04/17/2017, 9:02 AM   LOS: 1 day

## 2017-04-18 ENCOUNTER — Inpatient Hospital Stay (HOSPITAL_COMMUNITY): Payer: Medicare Other

## 2017-04-18 DIAGNOSIS — F10931 Alcohol use, unspecified with withdrawal delirium: Secondary | ICD-10-CM

## 2017-04-18 DIAGNOSIS — F10231 Alcohol dependence with withdrawal delirium: Secondary | ICD-10-CM

## 2017-04-18 LAB — BASIC METABOLIC PANEL
ANION GAP: 11 (ref 5–15)
BUN: 17 mg/dL (ref 6–20)
CALCIUM: 9.5 mg/dL (ref 8.9–10.3)
CHLORIDE: 99 mmol/L — AB (ref 101–111)
CO2: 29 mmol/L (ref 22–32)
CREATININE: 0.88 mg/dL (ref 0.61–1.24)
GFR calc non Af Amer: >60 mL/min (ref >60)
Glucose, Bld: 145 mg/dL — ABNORMAL HIGH (ref 65–99)
Potassium: 4.5 mmol/L (ref 3.5–5.1)
SODIUM: 139 mmol/L (ref 135–145)

## 2017-04-18 LAB — HEPATIC FUNCTION PANEL
ALBUMIN: 4.5 g/dL (ref 3.5–5.0)
ALK PHOS: 45 U/L (ref 38–126)
ALT: 25 U/L (ref 17–63)
AST: 29 U/L (ref 15–41)
BILIRUBIN TOTAL: 0.3 mg/dL (ref 0.3–1.2)
Bilirubin, Direct: <0.1 mg/dL — ABNORMAL LOW (ref 0.1–0.5)
TOTAL PROTEIN: 7.8 g/dL (ref 6.5–8.1)

## 2017-04-18 LAB — MAGNESIUM: MAGNESIUM: 2.2 mg/dL (ref 1.7–2.4)

## 2017-04-18 LAB — TSH: TSH: 0.477 u[IU]/mL (ref 0.350–4.500)

## 2017-04-18 LAB — AMMONIA: Ammonia: <9 umol/L — ABNORMAL LOW (ref 9–35)

## 2017-04-18 LAB — VITAMIN B12: VITAMIN B 12: 557 pg/mL (ref 180–914)

## 2017-04-18 LAB — HIV ANTIBODY (ROUTINE TESTING W REFLEX): HIV SCREEN 4TH GENERATION: NONREACTIVE

## 2017-04-18 MED ORDER — THIAMINE HCL 100 MG/ML IJ SOLN
500.0000 mg | Freq: Three times a day (TID) | INTRAVENOUS | Status: AC
  Administered 2017-04-18 – 2017-04-19 (×4): 500 mg via INTRAVENOUS
  Filled 2017-04-18: qty 4
  Filled 2017-04-18 (×4): qty 5

## 2017-04-18 MED ORDER — THIAMINE HCL 100 MG/ML IJ SOLN
INTRAMUSCULAR | Status: AC
  Filled 2017-04-18: qty 6

## 2017-04-18 MED ORDER — ARFORMOTEROL TARTRATE 15 MCG/2ML IN NEBU
15.0000 ug | INHALATION_SOLUTION | Freq: Two times a day (BID) | RESPIRATORY_TRACT | Status: DC
  Administered 2017-04-18 – 2017-04-23 (×11): 15 ug via RESPIRATORY_TRACT
  Filled 2017-04-18 (×11): qty 2

## 2017-04-18 NOTE — Progress Notes (Signed)
Sitting in chair eating lunch.  Confused about date and said he was at [facility].  Keeps taking monitor out of pocket and laying it on floor.  Restless.  Chest xray performed   Given ativan for restlessness and anxiety

## 2017-04-18 NOTE — Progress Notes (Signed)
PROGRESS NOTE  Robert [name] URK:[id number] DOB: 1955/06/30 DOA: 04/16/2017 PCP: Claggett, Elin, PA-C  Brief History:  62 year old male with a history of COPD, chronic respiratory failure on 3 L, anxiety, and tobacco abuse presenting with 5-day history of increasing coughing, shortness of breath, and generalized weakness.  The patient denied any fevers, chills, chest pain, nausea, vomiting, diarrhea.  Patient states that he checked his oxygen saturation at home and noted it was in the 80s.  He has had some increase in his lower extremity edema.  He denies any hemoptysis, abdominal pain, dysuria, hematuria.  The patient continues to smoke approximately 3-4 cigarettes every 2-3 days.  He has a 50-pack-year history.  The patient was given intravenous steroids and bronchodilators and admitted for treatment for COPD exacerbation.  Assessment/Plan: COPD exacerbation -Continue Pulmicort -Continue duo nebs -continue IV steroids -start brovana -Start azithromycin -Viral respiratory panel--neg -still wheezing and not moving good tidal volumes -repeat CXR  Acute on Chronic respiratory failure with hypoxia -Normally on 3 L at home -Presently stable on 3 L -Personally reviewed EKG-sinus rhythm, nonspecific T wave change -Personally reviewed chest x-ray--hyperinflation without infiltrates or edema -Echo to rule out degree of cor pulmonale  Delirium/Acute metabolic encephalopathy -likely delirium tremens -start high dose thiamine -check UA/culture -check B12 -check TSH, ammonia -I personally checked vitals at 1050--98.4, HR 106-RR 24- 154/90--99% on 3L  Tobacco abuse -I have discussed tobacco cessation with the patient.  I have counseled the patient regarding the negative impacts of continued tobacco use including but not limited to lung cancer, COPD, and cardiovascular disease.  I have discussed alternatives to tobacco and modalities that may help facilitate tobacco cessation  including but not limited to biofeedback, hypnosis, and medications.  Total time spent with tobacco counseling was   Alcohol abuse -CIWA  Disposition Plan:   Home in 2-3 days  Family Communication:  No Family at bedside  Consultants:  none  Code Status:  FULL   DVT Prophylaxis:  [place] Lovenox   Procedures: As Listed in Progress Note Above  Antibiotics: None      Subjective: Patient is confused at this morning.  He states that his breathing shortness of breath or not much better than the last couple days.  He has a nonproductive cough.  He denies any fevers, chills, chest pain, nausea, vomiting, diarrhea, abdominal pain.  Denies diarrhea, hematochezia, melena.  Objective: Vitals:   04/18/17 0559 04/18/17 0600 04/18/17 0753 04/18/17 0758  BP: 132/60     Pulse: 91 98    Resp:      Temp: 97.8 F (36.6 C)     TempSrc: Oral     SpO2: 95%  91% 91%  Weight:      Height:        Intake/Output Summary (Last 24 hours) at 04/18/2017 1101 Last data filed at 04/18/2017 0000 Gross per 24 hour  Intake 630 ml  Output 2100 ml  Net -1470 ml   Weight change:  Exam:   General:  Pt is alert, follows commands appropriately, not in acute distress  HEENT: No icterus, No thrush, No neck mass, [place]/AT  Cardiovascular: RRR, S1/S2, no rubs, no gallops  Respiratory: Bilateral expiratory wheeze.  Bibasilar rales.  Abdomen: Soft/+BS, non tender, non distended, no guarding  Extremities: No edema, No lymphangitis, No petechiae, No rashes, no synovitis   Data Reviewed: I have personally reviewed following labs and imaging studies Basic Metabolic Panel: Recent Labs  Lab 04/16/17 1546 04/17/17 0539 04/18/17 0403  NA 135 139 139  K 4.4 4.8 4.5  CL 95* 100* 99*  CO2 27 29 29   GLUCOSE 98 140* 145*  BUN 13 14 17   CREATININE 0.76 0.73 0.88  CALCIUM 9.8 9.5 9.5  MG  --   --  2.2   Liver Function Tests: Recent Labs  Lab 04/17/17 0539  AST 22  ALT 19  ALKPHOS 46  BILITOT  0.7  PROT 7.3  ALBUMIN 4.1   No results for input(s): LIPASE, AMYLASE in the last 168 hours. No results for input(s): AMMONIA in the last 168 hours. Coagulation Profile: No results for input(s): INR, PROTIME in the last 168 hours. CBC: Recent Labs  Lab 04/16/17 1546 04/17/17 0539  WBC 7.4 4.3  HGB 13.2 12.6*  HCT 41.5 39.2  MCV 93.3 92.9  PLT 327 316   Cardiac Enzymes: Recent Labs  Lab 04/16/17 1609  TROPONINI <0.03   BNP: Invalid input(s): POCBNP CBG: No results for input(s): GLUCAP in the last 168 hours. HbA1C: No results for input(s): HGBA1C in the last 72 hours. Urine analysis:    Component Value Date/Time   COLORURINE STRAW (A) 02/16/2016 2023   APPEARANCEUR CLEAR 02/16/2016 2023   APPEARANCEUR Clear 11/14/2013 1034   LABSPEC 1.009 02/16/2016 2023   LABSPEC 1.003 11/14/2013 1034   PHURINE 6.0 02/16/2016 2023   GLUCOSEU NEGATIVE 02/16/2016 2023   GLUCOSEU Negative 11/14/2013 1034   HGBUR SMALL (A) 02/16/2016 2023   BILIRUBINUR NEGATIVE 02/16/2016 2023   BILIRUBINUR Negative 11/14/2013 [place] 02/16/2016 2023   PROTEINUR NEGATIVE 02/16/2016 2023   NITRITE NEGATIVE 02/16/2016 2023   LEUKOCYTESUR NEGATIVE 02/16/2016 2023   LEUKOCYTESUR Negative 11/14/2013 1034   Sepsis Labs: @LABRCNTIP (procalcitonin:4,lacticidven:4) ) Recent Results (from the past 240 hour(s))  Respiratory Panel by PCR     Status: None   Collection Time: 04/17/17  9:10 AM  Result Value Ref Range Status   Adenovirus NOT DETECTED NOT DETECTED Final   Coronavirus 229E NOT DETECTED NOT DETECTED Final   Coronavirus HKU1 NOT DETECTED NOT DETECTED Final   Coronavirus NL63 NOT DETECTED NOT DETECTED Final   Coronavirus OC43 NOT DETECTED NOT DETECTED Final   Metapneumovirus NOT DETECTED NOT DETECTED Final   Rhinovirus / Enterovirus NOT DETECTED NOT DETECTED Final   Influenza A NOT DETECTED NOT DETECTED Final   Influenza B NOT DETECTED NOT DETECTED Final   Parainfluenza Virus  1 NOT DETECTED NOT DETECTED Final   Parainfluenza Virus 2 NOT DETECTED NOT DETECTED Final   Parainfluenza Virus 3 NOT DETECTED NOT DETECTED Final   Parainfluenza Virus 4 NOT DETECTED NOT DETECTED Final   Respiratory Syncytial Virus NOT DETECTED NOT DETECTED Final   Bordetella pertussis NOT DETECTED NOT DETECTED Final   Chlamydophila pneumoniae NOT DETECTED NOT DETECTED Final   Mycoplasma pneumoniae NOT DETECTED NOT DETECTED Final    Comment: Performed at [facility], [place] [address], [place], [place] [address]     Scheduled Meds: . arformoterol  15 mcg Nebulization BID  . aspirin EC  81 mg Oral Daily  . azithromycin  500 mg Oral Daily  . budesonide (PULMICORT) nebulizer solution  0.5 mg Nebulization BID  . enoxaparin (LOVENOX) injection  40 mg Subcutaneous Q24H  . folic acid  1 mg Oral Daily  . ipratropium-albuterol  3 mL Nebulization Q6H  . methylPREDNISolone (SOLU-MEDROL) injection  60 mg Intravenous Q6H  . multivitamin with minerals  1 tablet Oral Daily  Continuous Infusions: . thiamine injection      Procedures/Studies: Dg Chest Port 1 View  Result Date: 04/16/2017 CLINICAL DATA:  Short of breath EXAM: PORTABLE CHEST 1 VIEW COMPARISON:  01/31/2017 FINDINGS: Hyperinflation with emphysematous disease. No focal airspace disease or pleural effusion. Normal cardiomediastinal silhouette. Aortic atherosclerosis. No pneumothorax. IMPRESSION: Hyperinflation with emphysematous disease. Electronically Signed   By: Donavan Foil M.D.   On: 04/16/2017 16:30    Orson Eva, DO  [facility] Pager [phone]  If 7PM-7AM, please contact night-coverage [email or web address] Password [email or web address] 04/18/2017, 11:01 AM   LOS: 2 days

## 2017-04-18 NOTE — Progress Notes (Signed)
Increased confusion, constantly coughing.  93% on 3 liters.  Texted Dr. Carles Collet

## 2017-04-18 NOTE — Progress Notes (Signed)
Has not voided since this morning.  Scan showed only 20 mls

## 2017-04-19 ENCOUNTER — Inpatient Hospital Stay (HOSPITAL_COMMUNITY): Payer: Medicare Other

## 2017-04-19 DIAGNOSIS — J441 Chronic obstructive pulmonary disease with (acute) exacerbation: Principal | ICD-10-CM

## 2017-04-19 DIAGNOSIS — J9621 Acute and chronic respiratory failure with hypoxia: Secondary | ICD-10-CM

## 2017-04-19 LAB — BLOOD GAS, ARTERIAL
Acid-Base Excess: 11.7 mmol/L — ABNORMAL HIGH (ref 0.0–2.0)
Bicarbonate: 33.8 mmol/L — ABNORMAL HIGH (ref 20.0–28.0)
Delivery systems: POSITIVE
Drawn by: 221791
EXPIRATORY PAP: 7
FIO2: 40
INSPIRATORY PAP: 14
LHR: 8 {breaths}/min
O2 Saturation: 97.1 %
PCO2 ART: 65.8 mmHg — AB (ref 32.0–48.0)
PH ART: 7.372 (ref 7.350–7.450)
pO2, Arterial: 97.8 mmHg (ref 83.0–108.0)

## 2017-04-19 LAB — URINALYSIS, COMPLETE (UACMP) WITH MICROSCOPIC
Bacteria, UA: NONE SEEN
Bilirubin Urine: NEGATIVE
GLUCOSE, UA: NEGATIVE mg/dL
Ketones, ur: NEGATIVE mg/dL
Leukocytes, UA: NEGATIVE
Nitrite: NEGATIVE
PH: 6 (ref 5.0–8.0)
Protein, ur: NEGATIVE mg/dL
Specific Gravity, Urine: 1.009 (ref 1.005–1.030)
Squamous Epithelial / LPF: NONE SEEN
WBC, UA: NONE SEEN WBC/hpf (ref 0–5)

## 2017-04-19 LAB — BRAIN NATRIURETIC PEPTIDE: B Natriuretic Peptide: 80 pg/mL (ref 0.0–100.0)

## 2017-04-19 LAB — BASIC METABOLIC PANEL
ANION GAP: 10 (ref 5–15)
BUN: 17 mg/dL (ref 6–20)
CO2: 32 mmol/L (ref 22–32)
Calcium: 9.3 mg/dL (ref 8.9–10.3)
Chloride: 96 mmol/L — ABNORMAL LOW (ref 101–111)
Creatinine, Ser: 0.85 mg/dL (ref 0.61–1.24)
GFR calc Af Amer: >60 mL/min (ref >60)
GLUCOSE: 122 mg/dL — AB (ref 65–99)
POTASSIUM: 5.2 mmol/L — AB (ref 3.5–5.1)
Sodium: 138 mmol/L (ref 135–145)

## 2017-04-19 LAB — ECHOCARDIOGRAM COMPLETE
HEIGHTINCHES: 68 in
WEIGHTICAEL: 2918.4 [oz_av]

## 2017-04-19 MED ORDER — CHLORHEXIDINE GLUCONATE 0.12 % MT SOLN
15.0000 mL | Freq: Two times a day (BID) | OROMUCOSAL | Status: DC
  Administered 2017-04-19 – 2017-04-20 (×3): 15 mL via OROMUCOSAL
  Filled 2017-04-19 (×2): qty 15

## 2017-04-19 MED ORDER — FUROSEMIDE 10 MG/ML IJ SOLN
40.0000 mg | Freq: Once | INTRAMUSCULAR | Status: AC
  Administered 2017-04-19: 40 mg via INTRAVENOUS
  Filled 2017-04-19: qty 4

## 2017-04-19 MED ORDER — THIAMINE HCL 100 MG/ML IJ SOLN
INTRAMUSCULAR | Status: AC
Start: 2017-04-19 — End: ?
  Filled 2017-04-19: qty 6

## 2017-04-19 MED ORDER — LORAZEPAM 2 MG/ML IJ SOLN
1.0000 mg | INTRAMUSCULAR | Status: DC | PRN
  Administered 2017-04-19 – 2017-04-22 (×7): 1 mg via INTRAVENOUS
  Filled 2017-04-19 (×8): qty 1

## 2017-04-19 MED ORDER — GUAIFENESIN 100 MG/5ML PO SOLN
10.0000 mL | ORAL | Status: DC | PRN
  Administered 2017-04-19 – 2017-04-22 (×3): 200 mg via ORAL
  Filled 2017-04-19 (×2): qty 10
  Filled 2017-04-19: qty 5

## 2017-04-19 MED ORDER — GUAIFENESIN 200 MG PO TABS
200.0000 mg | ORAL_TABLET | ORAL | Status: DC | PRN
  Filled 2017-04-19: qty 1

## 2017-04-19 MED ORDER — ORAL CARE MOUTH RINSE: 15.0000 mL | Freq: Two times a day (BID) | OROMUCOSAL | Status: DC

## 2017-04-19 NOTE — Progress Notes (Signed)
Night shift floor coverage note.  The patient was seen due to respiratory distress despite recent treatment with bronchodilators and high flow nasal cannula oxygen supplementation.  His O2 sat was initially 85%, when he was seen by respiratory therapy.His most recent vital signs temperature 97.49F, pulse 107, respiration 26 and O2 sat 98% on high flow nasal cannula oxygen supplementation.    Lungs: Decreased breath sounds with bilateral wheezing. Chest: Positive intercostal and supraclavicular muscle use. CV: S1, S2, tachycardic at 114 with a regular rhythm, no murmurs heard. Abdomen: Mildly obese, soft, nontender. Extremities: No edema, mild fingernails clubbing  Labs have been reviewed.  Hyperglycemia to 140s and hemoglobin of 12.6 g/dL noticed.  Chest radiograph shows mild bibasilar subsegmental atelectasis.  Assessment/plan:  Acute respiratory failure COPD exacerbation  Unfortunately, BiPAP ventilation was ordered due to the patient's fatigue and no improvement.  He has been transferred to the stepdown unit for closer monitoring. Continue all other current therapy.  Tennis Must, MD  About 35 minutes of critical care time were used during this emergent issue.  This document was prepared using Dragon voice recognition software and may contain some unintended transcription errors.

## 2017-04-19 NOTE — Progress Notes (Signed)
PROGRESS NOTE  ARRIN PINTOR YKZ:[id number] DOB: Sep 11, 1955 DOA: 04/16/2017 PCP: Claggett, Elin, PA-C  Brief History: 62 year old male with a history of COPD, chronic respiratory failure on 3 L, anxiety, and tobacco abuse presenting with 5-day history of increasing coughing, shortness of breath, and generalized weakness. The patient denied any fevers, chills, chest pain, nausea, vomiting, diarrhea. Patient states that he checked his oxygen saturation at home and noted it was in the 80s. He has had some increase in his lower extremity edema. He denies any hemoptysis, abdominal pain, dysuria, hematuria. The patient continues to smoke approximately 3-4 cigarettes every 2-3 days. He has a 50-pack-year history. The patient was given intravenous steroids and bronchodilators and admitted for treatment for COPD exacerbation.  In the early morning 04/19/2017, the patient developed respiratory distress.  He was moved to the stepdown unit and placed on BiPAP.  Assessment/Plan: COPD exacerbation -Continue Pulmicort -Continue duo nebs -continue IV steroids -continue brovana -continue azithromycin -04/19/17 am--developed respiratory distress-->placed on BiPAP -Viral respiratory panel--neg -still wheezing and not moving good tidal volumes -personally reviewed 4/11 CXR--increase interstitial markings and venous engorgement  Acute onChronic respiratory failure with hypoxia -Normally on 3 L at home -now on BiPAP FiO2--0.4 -Personally reviewed EKG-sinus rhythm, nonspecific T wave change -personally reviewed 4/11 CXR--increase interstitial markings and venous -Echo to rule out degree of cor pulmonale -give lasix 40 mg x 1  Delirium/Acute metabolic encephalopathy -likely delirium tremens -continue high dose thiamine -check UA--no pyruia -check B12--557 -check TSH0.477, ammonia <9  Tobacco abuse -I have discussed tobacco cessation with the patient. I have counseled the  patient regarding the negative impacts of continued tobacco use including but not limited to lung cancer, COPD, and cardiovascular disease. I have discussed alternatives to tobacco and modalities that may help facilitate tobacco cessation including but not limited to biofeedback, hypnosis, and medications. Total time spent with tobacco counseling was 4 min   Alcohol abuse -CIWA  Hyperkalemia -anticipate improvement with lasix IV -keep on tele  Disposition Plan: remain in SDU--not stable for d/c Family Communication:NoFamily at bedside  Consultants:none  Code Status: FULL   DVT Prophylaxis: [place] Lovenox   Procedures: As Listed in Progress Note Above  Antibiotics: Azithromycin 4/10>>> ceftriazone 4/9 x 1      Subjective: Patient remains confused.  Events of the evening reviewed.  The patient developed respiratory distress overnight and had to be placed on BiPAP.  He currently denies any chest pain, shortness of breath, abdominal pain, vomiting, diarrhea.  Objective: Vitals:   04/19/17 0440 04/19/17 0459 04/19/17 0600 04/19/17 0758  BP:   117/76   Pulse:  (!) 107 88   Resp:  (!) 26 (!) 21   Temp:    98 F (36.7 C)  TempSrc:    Oral  SpO2: (!) 85% 98% 97%   Weight:      Height:        Intake/Output Summary (Last 24 hours) at 04/19/2017 0810 Last data filed at 04/18/2017 2310 Gross per 24 hour  Intake 920 ml  Output 2100 ml  Net -1180 ml   Weight change:  Exam:   General:  Pt is alert, follows commands appropriately, not in acute distress  HEENT: No icterus, No thrush, No neck mass, [place]/AT  Cardiovascular: RRR, S1/S2, no rubs, no gallops+JVD  Respiratory: Bilateral crackles.  Bilateral expiratory wheeze.  Poor tidal volumes.  Abdomen: Soft/+BS, non tender, non distended, no guarding  Extremities: 1 + LE edema,  No lymphangitis, No petechiae, No rashes, no synovitis   Data Reviewed: I have personally reviewed following labs and imaging  studies Basic Metabolic Panel: Recent Labs  Lab 04/16/17 1546 04/17/17 0539 04/18/17 0403 04/19/17 0413  NA 135 139 139 138  K 4.4 4.8 4.5 5.2*  CL 95* 100* 99* 96*  CO2 27 29 29  32  GLUCOSE 98 140* 145* 122*  BUN 13 14 17 17   CREATININE 0.76 0.73 0.88 0.85  CALCIUM 9.8 9.5 9.5 9.3  MG  --   --  2.2  --    Liver Function Tests: Recent Labs  Lab 04/17/17 0539 04/18/17 1154  AST 22 29  ALT 19 25  ALKPHOS 46 45  BILITOT 0.7 0.3  PROT 7.3 7.8  ALBUMIN 4.1 4.5   No results for input(s): LIPASE, AMYLASE in the last 168 hours. Recent Labs  Lab 04/18/17 1154  AMMONIA <9*   Coagulation Profile: No results for input(s): INR, PROTIME in the last 168 hours. CBC: Recent Labs  Lab 04/16/17 1546 04/17/17 0539  WBC 7.4 4.3  HGB 13.2 12.6*  HCT 41.5 39.2  MCV 93.3 92.9  PLT 327 316   Cardiac Enzymes: Recent Labs  Lab 04/16/17 1609  TROPONINI <0.03   BNP: Invalid input(s): POCBNP CBG: No results for input(s): GLUCAP in the last 168 hours. HbA1C: No results for input(s): HGBA1C in the last 72 hours. Urine analysis:    Component Value Date/Time   COLORURINE YELLOW 04/18/2017 1111   APPEARANCEUR CLEAR 04/18/2017 1111   APPEARANCEUR Clear 11/14/2013 1034   LABSPEC 1.009 04/18/2017 1111   LABSPEC 1.003 11/14/2013 1034   PHURINE 6.0 04/18/2017 1111   GLUCOSEU NEGATIVE 04/18/2017 1111   GLUCOSEU Negative 11/14/2013 1034   HGBUR SMALL (A) 04/18/2017 1111   BILIRUBINUR NEGATIVE 04/18/2017 1111   BILIRUBINUR Negative 11/14/2013 1034   KETONESUR NEGATIVE 04/18/2017 1111   PROTEINUR NEGATIVE 04/18/2017 1111   NITRITE NEGATIVE 04/18/2017 1111   LEUKOCYTESUR NEGATIVE 04/18/2017 1111   LEUKOCYTESUR Negative 11/14/2013 1034   Sepsis Labs: @LABRCNTIP (procalcitonin:4,lacticidven:4) ) Recent Results (from the past 240 hour(s))  Respiratory Panel by PCR     Status: None   Collection Time: 04/17/17  9:10 AM  Result Value Ref Range Status   Adenovirus NOT DETECTED NOT  DETECTED Final   Coronavirus 229E NOT DETECTED NOT DETECTED Final   Coronavirus HKU1 NOT DETECTED NOT DETECTED Final   Coronavirus NL63 NOT DETECTED NOT DETECTED Final   Coronavirus OC43 NOT DETECTED NOT DETECTED Final   Metapneumovirus NOT DETECTED NOT DETECTED Final   Rhinovirus / Enterovirus NOT DETECTED NOT DETECTED Final   Influenza A NOT DETECTED NOT DETECTED Final   Influenza B NOT DETECTED NOT DETECTED Final   Parainfluenza Virus 1 NOT DETECTED NOT DETECTED Final   Parainfluenza Virus 2 NOT DETECTED NOT DETECTED Final   Parainfluenza Virus 3 NOT DETECTED NOT DETECTED Final   Parainfluenza Virus 4 NOT DETECTED NOT DETECTED Final   Respiratory Syncytial Virus NOT DETECTED NOT DETECTED Final   Bordetella pertussis NOT DETECTED NOT DETECTED Final   Chlamydophila pneumoniae NOT DETECTED NOT DETECTED Final   Mycoplasma pneumoniae NOT DETECTED NOT DETECTED Final    Comment: Performed at [facility], [place] [address] [place], [place] [address]     Scheduled Meds: . arformoterol  15 mcg Nebulization BID  . aspirin EC  81 mg Oral Daily  . azithromycin  500 mg Oral Daily  . budesonide (PULMICORT) nebulizer solution  0.5 mg Nebulization BID  .  enoxaparin (LOVENOX) injection  40 mg Subcutaneous Q24H  . folic acid  1 mg Oral Daily  . furosemide  40 mg Intravenous Once  . ipratropium-albuterol  3 mL Nebulization Q6H  . methylPREDNISolone (SOLU-MEDROL) injection  60 mg Intravenous Q6H  . multivitamin with minerals  1 tablet Oral Daily   Continuous Infusions: . thiamine injection Stopped (04/19/17 0320)    Procedures/Studies: Dg Chest Port 1 View  Result Date: 04/18/2017 CLINICAL DATA:  COPD. EXAM: PORTABLE CHEST 1 VIEW COMPARISON:  04/16/2017. FINDINGS: Mediastinum and hilar structures are normal. Mild bibasilar subsegmental atelectasis. No pleural effusion or pneumothorax. Heart size normal. No acute bony abnormality. IMPRESSION: Mild bibasilar subsegmental atelectasis.  Electronically Signed   By: Marcello Moores  Register   On: 04/18/2017 12:04   Dg Chest Port 1 View  Result Date: 04/16/2017 CLINICAL DATA:  Short of breath EXAM: PORTABLE CHEST 1 VIEW COMPARISON:  01/31/2017 FINDINGS: Hyperinflation with emphysematous disease. No focal airspace disease or pleural effusion. Normal cardiomediastinal silhouette. Aortic atherosclerosis. No pneumothorax. IMPRESSION: Hyperinflation with emphysematous disease. Electronically Signed   By: Donavan Foil M.D.   On: 04/16/2017 16:30    Orson Eva, DO  [facility] Pager [phone]  If 7PM-7AM, please contact night-coverage [email or web address] Password [email or web address] 04/19/2017, 8:10 AM   LOS: 3 days

## 2017-04-19 NOTE — Care Management Important Message (Signed)
Important Message  Patient Details  Name: HAMID BROOKENS MRN: [id number] Date of Birth: 1955-05-03   Medicare Important Message Given:  Yes    Shelda Altes 04/19/2017, 1:02 PM

## 2017-04-19 NOTE — Progress Notes (Signed)
  Echocardiogram 2D Echocardiogram has been performed.  Robert [name] 04/19/2017, 3:11 PM

## 2017-04-20 LAB — BASIC METABOLIC PANEL
Anion gap: 7 (ref 5–15)
BUN: 28 mg/dL — ABNORMAL HIGH (ref 6–20)
CALCIUM: 9.2 mg/dL (ref 8.9–10.3)
CO2: 37 mmol/L — ABNORMAL HIGH (ref 22–32)
CREATININE: 0.99 mg/dL (ref 0.61–1.24)
Chloride: 93 mmol/L — ABNORMAL LOW (ref 101–111)
Glucose, Bld: 135 mg/dL — ABNORMAL HIGH (ref 65–99)
Potassium: 4.4 mmol/L (ref 3.5–5.1)
SODIUM: 137 mmol/L (ref 135–145)

## 2017-04-20 LAB — MRSA PCR SCREENING: MRSA by PCR: NEGATIVE

## 2017-04-20 MED ORDER — ORAL CARE MOUTH RINSE
15.0000 mL | Freq: Two times a day (BID) | OROMUCOSAL | Status: DC
  Administered 2017-04-20 – 2017-04-22 (×6): 15 mL via OROMUCOSAL

## 2017-04-20 MED ORDER — CHLORHEXIDINE GLUCONATE 0.12 % MT SOLN
15.0000 mL | Freq: Two times a day (BID) | OROMUCOSAL | Status: DC
  Administered 2017-04-20 – 2017-04-23 (×8): 15 mL via OROMUCOSAL
  Filled 2017-04-20 (×5): qty 15

## 2017-04-20 NOTE — Progress Notes (Signed)
Off BiPAP for now on HFNC 10 liter will see how long he does at this. Saturation 95

## 2017-04-20 NOTE — Progress Notes (Signed)
0940 Patient ready to eat brkfst. BiPAP removed and turned off, O2 via [place] @ 3L applied with O2 SATs @ 97%. Safety mitts removed, brkfst tray set up for patient and patient is eating at this time.

## 2017-04-20 NOTE — Progress Notes (Signed)
Patient off BiPAP still on high flow coughing up secretions. Saturation stable 95, he is still a little confused.

## 2017-04-20 NOTE — Progress Notes (Signed)
PROGRESS NOTE  Robert [name] AQT:[id number] DOB: 1955/12/12 DOA: 04/16/2017 PCP: Claggett, Elin, PA-C  Brief History: 62 year old male with a history of COPD, chronic respiratory failure on 3 L, anxiety, and tobacco abuse presenting with 5-day history of increasing coughing, shortness of breath, and generalized weakness. The patient denied any fevers, chills, chest pain, nausea, vomiting, diarrhea. Patient states that he checked his oxygen saturation at home and noted it was in the 80s. He has had some increase in his lower extremity edema. He denies any hemoptysis, abdominal pain, dysuria, hematuria. The patient continues to smoke approximately 3-4 cigarettes every 2-3 days. He has a 50-pack-year history. The patient was given intravenous steroids and bronchodilators and admitted for treatment for COPD exacerbation.  In the early morning 04/19/2017, the patient developed respiratory distress.  He was moved to the stepdown unit and placed on BiPAP.  Assessment/Plan: COPD exacerbation -ContinuePulmicort -Continue duo nebs -continue IV steroids -continue brovana -continue azithromycin -04/19/17 am--developed respiratory distress-->placed on BiPAP -Viral respiratory panel--neg -still requiring BiPAP through night and much of day -repeat CXR am  Acute onChronic respiratory failure with hypoxia -Normally on 3 L at home -now on BiPAP FiO2--0.4 -Personally reviewed EKG-sinus rhythm, nonspecific T wave change -personally reviewed 4/11 CXR--increase interstitial markings and venous -Echo to rule out degree of cor pulmonale -give lasix 40 mg x 1 on 4/12  Delirium/Acute metabolic encephalopathy -likely delirium tremens - high dose thiamine>>>transition to 100mg  daily -check UA--no pyruia -check B12--557 -check TSH0.477, ammonia <9  Tobacco abuse -I have discussed tobacco cessation with the patient. I have counseled the patient regarding the negative impacts of  continued tobacco use including but not limited to lung cancer, COPD, and cardiovascular disease. I have discussed alternatives to tobacco and modalities that may help facilitate tobacco cessation including but not limited to biofeedback, hypnosis, and medications. Total time spent with tobacco counseling was 4 min   Alcohol abuse -CIWA  Hyperkalemia -improved with lasix -keep on tele  Disposition Plan: remain in SDU--not stable for d/c Family Communication:NoFamily at bedside  Consultants:none  Code Status: FULL   DVT Prophylaxis: [place] Lovenox   Procedures: As Listed in Progress Note Above  Antibiotics: Azithromycin 4/10>>> ceftriaxone 4/9 x 1      Subjective: Patient remains intermittently confused and agitated.  He is requiring BiPAP most of the evening.  When he gets agitated he requires BiPAP because of shortness of breath.  He denies any chest pain, abdominal pain, headache, neck pain.  He has a nonproductive cough.  Objective: Vitals:   04/20/17 1000 04/20/17 1059 04/20/17 1100 04/20/17 1133  BP: 133/85  (!) 149/82   Pulse: 99 (!) 112 (!) 102 (!) 101  Resp: (!) 23 (!) 25 (!) 22 (!) 22  Temp:    97.6 F (36.4 C)  TempSrc:    Axillary  SpO2: (!) 89% 91% 90% (!) 88%  Weight:      Height:        Intake/Output Summary (Last 24 hours) at 04/20/2017 1416 Last data filed at 04/20/2017 0502 Gross per 24 hour  Intake 290 ml  Output 500 ml  Net -210 ml   Weight change:  Exam:   General:  Pt is alert, follows commands appropriately, not in acute distress  HEENT: No icterus, No thrush, No neck mass, [place]/AT  Cardiovascular: RRR, S1/S2, no rubs, no gallops  Respiratory: Bibasilar rales.  Bilateral expiratory wheeze.  Good air movement.  Abdomen: Soft/+BS, non  tender, non distended, no guarding  Extremities: trace LE edema, No lymphangitis, No petechiae, No rashes, no synovitis   Data Reviewed: I have personally reviewed following labs  and imaging studies Basic Metabolic Panel: Recent Labs  Lab 04/16/17 1546 04/17/17 0539 04/18/17 0403 04/19/17 0413 04/20/17 0423  NA 135 139 139 138 137  K 4.4 4.8 4.5 5.2* 4.4  CL 95* 100* 99* 96* 93*  CO2 27 29 29  32 37*  GLUCOSE 98 140* 145* 122* 135*  BUN 13 14 17 17  28*  CREATININE 0.76 0.73 0.88 0.85 0.99  CALCIUM 9.8 9.5 9.5 9.3 9.2  MG  --   --  2.2  --   --    Liver Function Tests: Recent Labs  Lab 04/17/17 0539 04/18/17 1154  AST 22 29  ALT 19 25  ALKPHOS 46 45  BILITOT 0.7 0.3  PROT 7.3 7.8  ALBUMIN 4.1 4.5   No results for input(s): LIPASE, AMYLASE in the last 168 hours. Recent Labs  Lab 04/18/17 1154  AMMONIA <9*   Coagulation Profile: No results for input(s): INR, PROTIME in the last 168 hours. CBC: Recent Labs  Lab 04/16/17 1546 04/17/17 0539  WBC 7.4 4.3  HGB 13.2 12.6*  HCT 41.5 39.2  MCV 93.3 92.9  PLT 327 316   Cardiac Enzymes: Recent Labs  Lab 04/16/17 1609  TROPONINI <0.03   BNP: Invalid input(s): POCBNP CBG: No results for input(s): GLUCAP in the last 168 hours. HbA1C: No results for input(s): HGBA1C in the last 72 hours. Urine analysis:    Component Value Date/Time   COLORURINE YELLOW 04/18/2017 1111   APPEARANCEUR CLEAR 04/18/2017 1111   APPEARANCEUR Clear 11/14/2013 1034   LABSPEC 1.009 04/18/2017 1111   LABSPEC 1.003 11/14/2013 1034   PHURINE 6.0 04/18/2017 1111   GLUCOSEU NEGATIVE 04/18/2017 1111   GLUCOSEU Negative 11/14/2013 1034   HGBUR SMALL (A) 04/18/2017 1111   BILIRUBINUR NEGATIVE 04/18/2017 1111   BILIRUBINUR Negative 11/14/2013 1034   KETONESUR NEGATIVE 04/18/2017 1111   PROTEINUR NEGATIVE 04/18/2017 1111   NITRITE NEGATIVE 04/18/2017 1111   LEUKOCYTESUR NEGATIVE 04/18/2017 1111   LEUKOCYTESUR Negative 11/14/2013 1034   Sepsis Labs: @LABRCNTIP (procalcitonin:4,lacticidven:4) ) Recent Results (from the past 240 hour(s))  Respiratory Panel by PCR     Status: None   Collection Time: 04/17/17  9:10  AM  Result Value Ref Range Status   Adenovirus NOT DETECTED NOT DETECTED Final   Coronavirus 229E NOT DETECTED NOT DETECTED Final   Coronavirus HKU1 NOT DETECTED NOT DETECTED Final   Coronavirus NL63 NOT DETECTED NOT DETECTED Final   Coronavirus OC43 NOT DETECTED NOT DETECTED Final   Metapneumovirus NOT DETECTED NOT DETECTED Final   Rhinovirus / Enterovirus NOT DETECTED NOT DETECTED Final   Influenza A NOT DETECTED NOT DETECTED Final   Influenza B NOT DETECTED NOT DETECTED Final   Parainfluenza Virus 1 NOT DETECTED NOT DETECTED Final   Parainfluenza Virus 2 NOT DETECTED NOT DETECTED Final   Parainfluenza Virus 3 NOT DETECTED NOT DETECTED Final   Parainfluenza Virus 4 NOT DETECTED NOT DETECTED Final   Respiratory Syncytial Virus NOT DETECTED NOT DETECTED Final   Bordetella pertussis NOT DETECTED NOT DETECTED Final   Chlamydophila pneumoniae NOT DETECTED NOT DETECTED Final   Mycoplasma pneumoniae NOT DETECTED NOT DETECTED Final    Comment: Performed at [facility], [place] [address], [place], [place] [address]  MRSA PCR Screening     Status: None   Collection Time: 04/19/17  5:18 AM  Result Value Ref Range Status   MRSA by PCR NEGATIVE NEGATIVE Final    Comment:        The GeneXpert MRSA Assay (FDA approved for NASAL specimens only), is one component of a comprehensive MRSA colonization surveillance program. It is not intended to diagnose MRSA infection nor to guide or monitor treatment for MRSA infections. Performed at [facility] [address], [place], [place] [address]      Scheduled Meds: . arformoterol  15 mcg Nebulization BID  . aspirin EC  81 mg Oral Daily  . azithromycin  500 mg Oral Daily  . budesonide (PULMICORT) nebulizer solution  0.5 mg Nebulization BID  . chlorhexidine  15 mL Mouth Rinse BID  . enoxaparin (LOVENOX) injection  40 mg Subcutaneous Q24H  . folic acid  1 mg Oral Daily  . ipratropium-albuterol  3 mL Nebulization Q6H  . mouth rinse  15 mL  Mouth Rinse q12n4p  . methylPREDNISolone (SOLU-MEDROL) injection  60 mg Intravenous Q6H  . multivitamin with minerals  1 tablet Oral Daily   Continuous Infusions:  Procedures/Studies: Dg Chest Port 1 View  Result Date: 04/18/2017 CLINICAL DATA:  COPD. EXAM: PORTABLE CHEST 1 VIEW COMPARISON:  04/16/2017. FINDINGS: Mediastinum and hilar structures are normal. Mild bibasilar subsegmental atelectasis. No pleural effusion or pneumothorax. Heart size normal. No acute bony abnormality. IMPRESSION: Mild bibasilar subsegmental atelectasis. Electronically Signed   By: Marcello Moores  Register   On: 04/18/2017 12:04   Dg Chest Port 1 View  Result Date: 04/16/2017 CLINICAL DATA:  Short of breath EXAM: PORTABLE CHEST 1 VIEW COMPARISON:  01/31/2017 FINDINGS: Hyperinflation with emphysematous disease. No focal airspace disease or pleural effusion. Normal cardiomediastinal silhouette. Aortic atherosclerosis. No pneumothorax. IMPRESSION: Hyperinflation with emphysematous disease. Electronically Signed   By: Donavan Foil M.D.   On: 04/16/2017 16:30    Orson Eva, DO  [facility] Pager [phone]  If 7PM-7AM, please contact night-coverage [email or web address] Password [email or web address] 04/20/2017, 2:16 PM   LOS: 4 days

## 2017-04-21 ENCOUNTER — Inpatient Hospital Stay (HOSPITAL_COMMUNITY): Payer: Medicare Other

## 2017-04-21 DIAGNOSIS — J9621 Acute and chronic respiratory failure with hypoxia: Secondary | ICD-10-CM

## 2017-04-21 DIAGNOSIS — J9622 Acute and chronic respiratory failure with hypercapnia: Secondary | ICD-10-CM

## 2017-04-21 LAB — BASIC METABOLIC PANEL
Anion gap: 9 (ref 5–15)
BUN: 31 mg/dL — AB (ref 6–20)
CHLORIDE: 95 mmol/L — AB (ref 101–111)
CO2: 38 mmol/L — AB (ref 22–32)
CREATININE: 0.84 mg/dL (ref 0.61–1.24)
Calcium: 9.4 mg/dL (ref 8.9–10.3)
GFR calc Af Amer: >60 mL/min (ref >60)
GFR calc non Af Amer: >60 mL/min (ref >60)
Glucose, Bld: 123 mg/dL — ABNORMAL HIGH (ref 65–99)
POTASSIUM: 4.3 mmol/L (ref 3.5–5.1)
Sodium: 142 mmol/L (ref 135–145)

## 2017-04-21 LAB — CBC
HEMATOCRIT: 37.1 % — AB (ref 39.0–52.0)
Hemoglobin: 11.7 g/dL — ABNORMAL LOW (ref 13.0–17.0)
MCH: 29.6 pg (ref 26.0–34.0)
MCHC: 31.5 g/dL (ref 30.0–36.0)
MCV: 93.9 fL (ref 78.0–100.0)
PLATELETS: 297 10*3/uL (ref 150–400)
RBC: 3.95 MIL/uL — ABNORMAL LOW (ref 4.22–5.81)
RDW: 12.7 % (ref 11.5–15.5)
WBC: 8.2 10*3/uL (ref 4.0–10.5)

## 2017-04-21 MED ORDER — FUROSEMIDE 10 MG/ML IJ SOLN
20.0000 mg | Freq: Once | INTRAMUSCULAR | Status: AC
  Administered 2017-04-21: 20 mg via INTRAVENOUS
  Filled 2017-04-21: qty 2

## 2017-04-21 MED ORDER — VITAMIN B-1 100 MG PO TABS
100.0000 mg | ORAL_TABLET | Freq: Every day | ORAL | Status: DC
  Administered 2017-04-21 – 2017-04-23 (×3): 100 mg via ORAL
  Filled 2017-04-21 (×3): qty 1

## 2017-04-21 NOTE — Progress Notes (Signed)
PROGRESS NOTE  Robert [name] QPY:[id number] DOB: 1955-06-20 DOA: 04/16/2017 PCP: Claggett, Elin, PA-C  Brief History: 62 year old male with a history of COPD, chronic respiratory failure on 3 L, anxiety, and tobacco abuse presenting with 5-day history of increasing coughing, shortness of breath, and generalized weakness. The patient denied any fevers, chills, chest pain, nausea, vomiting, diarrhea. Patient states that he checked his oxygen saturation at home and noted it was in the 80s. He has had some increase in his lower extremity edema. He denies any hemoptysis, abdominal pain, dysuria, hematuria. The patient continues to smoke approximately 3-4 cigarettes every 2-3 days. He has a 50-pack-year history. The patient was given intravenous steroids and bronchodilators and admitted for treatment for COPD exacerbation. In the early morning 04/19/2017, the patient developed respiratory distress. He was moved to the stepdown unit and placed on BiPAP.  Assessment/Plan: COPD exacerbation -ContinuePulmicort -Continue duo nebs -continue IV steroids -continuebrovana -continueazithromycin -04/19/17 am--developed respiratory distress-->placed on BiPAP -Viral respiratory panel--neg -still requiring BiPAP intermittently at night time -still poor tidal volumes and wheezing  Acute onChronic respiratory failure with hypoxia -Normally on 3 L at home -still requires biPAP intermittently -Personally reviewed EKG-sinus rhythm, nonspecific T wave change -personally reviewed 4/14 CXR--increase interstitial markings improving -Echo--poor quality.  EF 60-65%.  Trivial MR/TR -give lasix 40 mg x 1 on 4/12 -give lasix 20 mg IV x 1 today  Delirium/Acute metabolic encephalopathy -likely delirium tremens -remains confused -high dose thiamine>>>transition to 100mg  daily -check UA--no pyruia -check B12--557 -check TSH0.477, ammonia<9  Tobacco abuse -I have discussed tobacco  cessation with the patient. I have counseled the patient regarding the negative impacts of continued tobacco use including but not limited to lung cancer, COPD, and cardiovascular disease. I have discussed alternatives to tobacco and modalities that may help facilitate tobacco cessation including but not limited to biofeedback, hypnosis, and medications. Total time spent with tobacco counseling was 4 min  Alcohol abuse -CIWA  Hyperkalemia -improved with lasix -keep on tele -am BMP  Disposition Plan: remain in SDU--not stable for d/c Family Communication:NoFamily at bedside--Total time spent 35 minutes.  Greater than 50% spent face to face counseling and coordinating care.    Consultants:none  Code Status: FULL   DVT Prophylaxis: [place] Lovenox   Procedures: As Listed in Progress Note Above  Antibiotics: Azithromycin 4/10>>> ceftriaxone 4/9 x 1    Subjective: Patient remains short of breath but overall a little bit better.  He still has nonproductive cough.  Denies any nausea, vomiting, diarrhea, abdominal pain.  There is no dysuria or hematuria.  He denies any chest pain, abdominal pain, headache, neck pain.  Objective: Vitals:   04/21/17 0700 04/21/17 0720 04/21/17 0736 04/21/17 0800  BP: 136/85   137/90  Pulse: 91  89 93  Resp: 17  18 18   Temp:   97.7 F (36.5 C)   TempSrc:   Oral   SpO2: 97% 96% 97% 98%  Weight:      Height:        Intake/Output Summary (Last 24 hours) at 04/21/2017 0958 Last data filed at 04/21/2017 0800 Gross per 24 hour  Intake 480 ml  Output 800 ml  Net -320 ml   Weight change:  Exam:   General:  Pt is alert, follows commands appropriately, not in acute distress  HEENT: No icterus, No thrush, No neck mass, [place]/AT  Cardiovascular: RRR, S1/S2, no rubs, no gallops  Respiratory: Bilateral expiratory wheeze.  Bibasilar  rales.  Good air movement.  Abdomen: Soft/+BS, non tender, non distended, no  guarding  Extremities: trace LE edema, No lymphangitis, No petechiae, No rashes, no synovitis   Data Reviewed: I have personally reviewed following labs and imaging studies Basic Metabolic Panel: Recent Labs  Lab 04/17/17 0539 04/18/17 0403 04/19/17 0413 04/20/17 0423 04/21/17 0501  NA 139 139 138 137 142  K 4.8 4.5 5.2* 4.4 4.3  CL 100* 99* 96* 93* 95*  CO2 29 29 32 37* 38*  GLUCOSE 140* 145* 122* 135* 123*  BUN 14 17 17  28* 31*  CREATININE 0.73 0.88 0.85 0.99 0.84  CALCIUM 9.5 9.5 9.3 9.2 9.4  MG  --  2.2  --   --   --    Liver Function Tests: Recent Labs  Lab 04/17/17 0539 04/18/17 1154  AST 22 29  ALT 19 25  ALKPHOS 46 45  BILITOT 0.7 0.3  PROT 7.3 7.8  ALBUMIN 4.1 4.5   No results for input(s): LIPASE, AMYLASE in the last 168 hours. Recent Labs  Lab 04/18/17 1154  AMMONIA <9*   Coagulation Profile: No results for input(s): INR, PROTIME in the last 168 hours. CBC: Recent Labs  Lab 04/16/17 1546 04/17/17 0539 04/21/17 0501  WBC 7.4 4.3 8.2  HGB 13.2 12.6* 11.7*  HCT 41.5 39.2 37.1*  MCV 93.3 92.9 93.9  PLT 327 316 297   Cardiac Enzymes: Recent Labs  Lab 04/16/17 1609  TROPONINI <0.03   BNP: Invalid input(s): POCBNP CBG: No results for input(s): GLUCAP in the last 168 hours. HbA1C: No results for input(s): HGBA1C in the last 72 hours. Urine analysis:    Component Value Date/Time   COLORURINE YELLOW 04/18/2017 1111   APPEARANCEUR CLEAR 04/18/2017 1111   APPEARANCEUR Clear 11/14/2013 1034   LABSPEC 1.009 04/18/2017 1111   LABSPEC 1.003 11/14/2013 1034   PHURINE 6.0 04/18/2017 1111   GLUCOSEU NEGATIVE 04/18/2017 1111   GLUCOSEU Negative 11/14/2013 1034   HGBUR SMALL (A) 04/18/2017 1111   BILIRUBINUR NEGATIVE 04/18/2017 1111   BILIRUBINUR Negative 11/14/2013 1034   KETONESUR NEGATIVE 04/18/2017 1111   PROTEINUR NEGATIVE 04/18/2017 1111   NITRITE NEGATIVE 04/18/2017 1111   LEUKOCYTESUR NEGATIVE 04/18/2017 1111   LEUKOCYTESUR Negative  11/14/2013 1034   Sepsis Labs: @LABRCNTIP (procalcitonin:4,lacticidven:4) ) Recent Results (from the past 240 hour(s))  Respiratory Panel by PCR     Status: None   Collection Time: 04/17/17  9:10 AM  Result Value Ref Range Status   Adenovirus NOT DETECTED NOT DETECTED Final   Coronavirus 229E NOT DETECTED NOT DETECTED Final   Coronavirus HKU1 NOT DETECTED NOT DETECTED Final   Coronavirus NL63 NOT DETECTED NOT DETECTED Final   Coronavirus OC43 NOT DETECTED NOT DETECTED Final   Metapneumovirus NOT DETECTED NOT DETECTED Final   Rhinovirus / Enterovirus NOT DETECTED NOT DETECTED Final   Influenza A NOT DETECTED NOT DETECTED Final   Influenza B NOT DETECTED NOT DETECTED Final   Parainfluenza Virus 1 NOT DETECTED NOT DETECTED Final   Parainfluenza Virus 2 NOT DETECTED NOT DETECTED Final   Parainfluenza Virus 3 NOT DETECTED NOT DETECTED Final   Parainfluenza Virus 4 NOT DETECTED NOT DETECTED Final   Respiratory Syncytial Virus NOT DETECTED NOT DETECTED Final   Bordetella pertussis NOT DETECTED NOT DETECTED Final   Chlamydophila pneumoniae NOT DETECTED NOT DETECTED Final   Mycoplasma pneumoniae NOT DETECTED NOT DETECTED Final    Comment: Performed at [facility], [place] [address], [place], [place] [address]  MRSA  PCR Screening     Status: None   Collection Time: 04/19/17  5:18 AM  Result Value Ref Range Status   MRSA by PCR NEGATIVE NEGATIVE Final    Comment:        The GeneXpert MRSA Assay (FDA approved for NASAL specimens only), is one component of a comprehensive MRSA colonization surveillance program. It is not intended to diagnose MRSA infection nor to guide or monitor treatment for MRSA infections. Performed at [facility], [address], [place], [place] [address]      Scheduled Meds: . arformoterol  15 mcg Nebulization BID  . aspirin EC  81 mg Oral Daily  . azithromycin  500 mg Oral Daily  . budesonide (PULMICORT) nebulizer solution  0.5 mg Nebulization BID  .  chlorhexidine  15 mL Mouth Rinse BID  . enoxaparin (LOVENOX) injection  40 mg Subcutaneous Q24H  . folic acid  1 mg Oral Daily  . ipratropium-albuterol  3 mL Nebulization Q6H  . mouth rinse  15 mL Mouth Rinse q12n4p  . methylPREDNISolone (SOLU-MEDROL) injection  60 mg Intravenous Q6H  . multivitamin with minerals  1 tablet Oral Daily   Continuous Infusions:  Procedures/Studies: Dg Chest Port 1 View  Result Date: 04/21/2017 CLINICAL DATA:  Acute on chronic respiratory failure with hypoxia and hypercapnia (HCC) EXAM: PORTABLE CHEST 1 VIEW COMPARISON:  Chest x-rays dated 04/18/2017 and 05/16/2016. FINDINGS: Mild cardiomegaly is stable. Atherosclerotic changes noted at the aortic arch. Lungs are hyperexpanded. Chronic bronchitic changes centrally. No confluent opacity to suggest a developing pneumonia. No pleural effusion or pneumothorax seen. No acute or suspicious osseous finding. IMPRESSION: 1. No active disease.  No evidence of pneumonia or pulmonary edema. 2. Hyperexpanded lungs indicating COPD. Associated chronic bronchitic changes. 3. Aortic atherosclerosis. 4. Stable mild cardiomegaly. Electronically Signed   By: Franki Cabot M.D.   On: 04/21/2017 09:33   Dg Chest Port 1 View  Result Date: 04/18/2017 CLINICAL DATA:  COPD. EXAM: PORTABLE CHEST 1 VIEW COMPARISON:  04/16/2017. FINDINGS: Mediastinum and hilar structures are normal. Mild bibasilar subsegmental atelectasis. No pleural effusion or pneumothorax. Heart size normal. No acute bony abnormality. IMPRESSION: Mild bibasilar subsegmental atelectasis. Electronically Signed   By: Marcello Moores  Register   On: 04/18/2017 12:04   Dg Chest Port 1 View  Result Date: 04/16/2017 CLINICAL DATA:  Short of breath EXAM: PORTABLE CHEST 1 VIEW COMPARISON:  01/31/2017 FINDINGS: Hyperinflation with emphysematous disease. No focal airspace disease or pleural effusion. Normal cardiomediastinal silhouette. Aortic atherosclerosis. No pneumothorax. IMPRESSION:  Hyperinflation with emphysematous disease. Electronically Signed   By: Donavan Foil M.D.   On: 04/16/2017 16:30    Orson Eva, DO  [facility] Pager [phone]  If 7PM-7AM, please contact night-coverage [email or web address] Password [email or web address] 04/21/2017, 9:58 AM   LOS: 5 days

## 2017-04-22 LAB — BASIC METABOLIC PANEL
ANION GAP: 11 (ref 5–15)
BUN: 26 mg/dL — ABNORMAL HIGH (ref 6–20)
CALCIUM: 9.1 mg/dL (ref 8.9–10.3)
CO2: 34 mmol/L — ABNORMAL HIGH (ref 22–32)
Chloride: 91 mmol/L — ABNORMAL LOW (ref 101–111)
Creatinine, Ser: 0.93 mg/dL (ref 0.61–1.24)
GLUCOSE: 131 mg/dL — AB (ref 65–99)
Potassium: 3.8 mmol/L (ref 3.5–5.1)
Sodium: 136 mmol/L (ref 135–145)

## 2017-04-22 MED ORDER — IBUPROFEN 400 MG PO TABS
400.0000 mg | ORAL_TABLET | ORAL | Status: DC | PRN
  Administered 2017-04-22: 400 mg via ORAL
  Filled 2017-04-22: qty 1

## 2017-04-22 MED ORDER — METHYLPREDNISOLONE SODIUM SUCC 125 MG IJ SOLR
60.0000 mg | Freq: Four times a day (QID) | INTRAMUSCULAR | Status: AC
  Administered 2017-04-22 (×2): 60 mg via INTRAVENOUS
  Filled 2017-04-22: qty 2

## 2017-04-22 MED ORDER — PREDNISONE 20 MG PO TABS
60.0000 mg | ORAL_TABLET | Freq: Every day | ORAL | Status: DC
  Administered 2017-04-23: 60 mg via ORAL
  Filled 2017-04-22: qty 3

## 2017-04-22 NOTE — Progress Notes (Signed)
PROGRESS NOTE  Robert [name] BJS:[id number] DOB: 1955/05/10 DOA: 04/16/2017 PCP: Claggett, Elin, PA-C  Brief History: 62 year old male with a history of COPD, chronic respiratory failure on 3 L, anxiety, and tobacco abuse presenting with 5-day history of increasing coughing, shortness of breath, and generalized weakness. The patient denied any fevers, chills, chest pain, nausea, vomiting, diarrhea. Patient states that he checked his oxygen saturation at home and noted it was in the 80s. He has had some increase in his lower extremity edema. He denies any hemoptysis, abdominal pain, dysuria, hematuria. The patient continues to smoke approximately 3-4 cigarettes every 2-3 days. He has a 50-pack-year history. The patient was given intravenous steroids and bronchodilators and admitted for treatment for COPD exacerbation. In the early morning 04/19/2017, the patient developed respiratory distress. He was moved to the stepdown unit and placed on BiPAP.  Assessment/Plan: COPD exacerbation -ContinuePulmicort -Continue duo nebs -continue IV steroids -continuebrovana -finishazithromycin today -04/19/17 am--developed respiratory distress-->placed on BiPAP -Viral respiratory panel--neg -now off BiPAP >24 hours -still wheezing, but improving  Acute onChronic respiratory failure with hypoxia -Normally on 3 L at home -now off BiPAP >24 hours -Personally reviewed EKG-sinus rhythm, nonspecific T wave change -personally reviewed 4/14 CXR--increase interstitial markings improving -Echo--poor quality.  EF 60-65%.  Trivial MR/TR -give lasix 40 mg x 1on 4/12 -give lasix 20 mg IV x 1 4/14  Delirium Tremens/Acute metabolic encephalopathy -likely delirium tremens -now improving -high dose thiamine>>>transition to 100mg  daily -check UA--no pyruia -check B12--557 -check TSH0.477, ammonia<9  Tobacco abuse -I have discussed tobacco cessation with the patient. I have  counseled the patient regarding the negative impacts of continued tobacco use including but not limited to lung cancer, COPD, and cardiovascular disease. I have discussed alternatives to tobacco and modalities that may help facilitate tobacco cessation including but not limited to biofeedback, hypnosis, and medications. Total time spent with tobacco counseling was 4 min  Alcohol abuse -CIWA  Hyperkalemia -improved with lasix -am BMP   Disposition Plan: remain in SDU--not stable for d/c Family Communication:NoFamily at bedside--Total time spent 35 minutes.  Greater than 50% spent face to face counseling and coordinating care.    Consultants:none  Code Status: FULL   DVT Prophylaxis: [place] Lovenox   Procedures: As Listed in Progress Note Above  Antibiotics: Azithromycin 4/10>>> ceftriaxone 4/9 x 1     Subjective: Patient is doing better.  He is less confused.  He denies any fevers, chills, chest pain, nausea, vomiting, diarrhea, abdominal pain.  He still has some dyspnea with exertion.  He has a nonproductive cough.  There is no hematochezia, melena.  Objective: Vitals:   04/22/17 0700 04/22/17 0728 04/22/17 0809 04/22/17 0816  BP: (!) 142/88     Pulse: 82 94    Resp: 20 19    Temp:   98.5 F (36.9 C)   TempSrc:   Oral   SpO2: 97% 97%  96%  Weight:      Height:        Intake/Output Summary (Last 24 hours) at 04/22/2017 0903 Last data filed at 04/22/2017 0739 Gross per 24 hour  Intake 640 ml  Output 3085 ml  Net -2445 ml   Weight change:  Exam:   General:  Pt is alert, follows commands appropriately, not in acute distress  HEENT: No icterus, No thrush, No neck mass, [place]/AT  Cardiovascular: RRR, S1/S2, no rubs, no gallops  Respiratory: Bilateral diminished breath sounds.  Bibasilar wheeze.  Good air movement.  Abdomen: Soft/+BS, non tender, non distended, no guarding  Extremities: No edema, No lymphangitis, No petechiae, No rashes, no  synovitis   Data Reviewed: I have personally reviewed following labs and imaging studies Basic Metabolic Panel: Recent Labs  Lab 04/18/17 0403 04/19/17 0413 04/20/17 0423 04/21/17 0501 04/22/17 0442  NA 139 138 137 142 136  K 4.5 5.2* 4.4 4.3 3.8  CL 99* 96* 93* 95* 91*  CO2 29 32 37* 38* 34*  GLUCOSE 145* 122* 135* 123* 131*  BUN 17 17 28* 31* 26*  CREATININE 0.88 0.85 0.99 0.84 0.93  CALCIUM 9.5 9.3 9.2 9.4 9.1  MG 2.2  --   --   --   --    Liver Function Tests: Recent Labs  Lab 04/17/17 0539 04/18/17 1154  AST 22 29  ALT 19 25  ALKPHOS 46 45  BILITOT 0.7 0.3  PROT 7.3 7.8  ALBUMIN 4.1 4.5   No results for input(s): LIPASE, AMYLASE in the last 168 hours. Recent Labs  Lab 04/18/17 1154  AMMONIA <9*   Coagulation Profile: No results for input(s): INR, PROTIME in the last 168 hours. CBC: Recent Labs  Lab 04/16/17 1546 04/17/17 0539 04/21/17 0501  WBC 7.4 4.3 8.2  HGB 13.2 12.6* 11.7*  HCT 41.5 39.2 37.1*  MCV 93.3 92.9 93.9  PLT 327 316 297   Cardiac Enzymes: Recent Labs  Lab 04/16/17 1609  TROPONINI <0.03   BNP: Invalid input(s): POCBNP CBG: No results for input(s): GLUCAP in the last 168 hours. HbA1C: No results for input(s): HGBA1C in the last 72 hours. Urine analysis:    Component Value Date/Time   COLORURINE YELLOW 04/18/2017 1111   APPEARANCEUR CLEAR 04/18/2017 1111   APPEARANCEUR Clear 11/14/2013 1034   LABSPEC 1.009 04/18/2017 1111   LABSPEC 1.003 11/14/2013 1034   PHURINE 6.0 04/18/2017 1111   GLUCOSEU NEGATIVE 04/18/2017 1111   GLUCOSEU Negative 11/14/2013 1034   HGBUR SMALL (A) 04/18/2017 1111   BILIRUBINUR NEGATIVE 04/18/2017 1111   BILIRUBINUR Negative 11/14/2013 1034   KETONESUR NEGATIVE 04/18/2017 1111   PROTEINUR NEGATIVE 04/18/2017 1111   NITRITE NEGATIVE 04/18/2017 1111   LEUKOCYTESUR NEGATIVE 04/18/2017 1111   LEUKOCYTESUR Negative 11/14/2013 1034   Sepsis  Labs: @LABRCNTIP (procalcitonin:4,lacticidven:4) ) Recent Results (from the past 240 hour(s))  Respiratory Panel by PCR     Status: None   Collection Time: 04/17/17  9:10 AM  Result Value Ref Range Status   Adenovirus NOT DETECTED NOT DETECTED Final   Coronavirus 229E NOT DETECTED NOT DETECTED Final   Coronavirus HKU1 NOT DETECTED NOT DETECTED Final   Coronavirus NL63 NOT DETECTED NOT DETECTED Final   Coronavirus OC43 NOT DETECTED NOT DETECTED Final   Metapneumovirus NOT DETECTED NOT DETECTED Final   Rhinovirus / Enterovirus NOT DETECTED NOT DETECTED Final   Influenza A NOT DETECTED NOT DETECTED Final   Influenza B NOT DETECTED NOT DETECTED Final   Parainfluenza Virus 1 NOT DETECTED NOT DETECTED Final   Parainfluenza Virus 2 NOT DETECTED NOT DETECTED Final   Parainfluenza Virus 3 NOT DETECTED NOT DETECTED Final   Parainfluenza Virus 4 NOT DETECTED NOT DETECTED Final   Respiratory Syncytial Virus NOT DETECTED NOT DETECTED Final   Bordetella pertussis NOT DETECTED NOT DETECTED Final   Chlamydophila pneumoniae NOT DETECTED NOT DETECTED Final   Mycoplasma pneumoniae NOT DETECTED NOT DETECTED Final    Comment: Performed at [facility], [place] [address], [place], [place] [address]  MRSA PCR Screening  Status: None   Collection Time: 04/19/17  5:18 AM  Result Value Ref Range Status   MRSA by PCR NEGATIVE NEGATIVE Final    Comment:        The GeneXpert MRSA Assay (FDA approved for NASAL specimens only), is one component of a comprehensive MRSA colonization surveillance program. It is not intended to diagnose MRSA infection nor to guide or monitor treatment for MRSA infections. Performed at [facility], [address], [place], [place] [address]      Scheduled Meds: . arformoterol  15 mcg Nebulization BID  . aspirin EC  81 mg Oral Daily  . azithromycin  500 mg Oral Daily  . budesonide (PULMICORT) nebulizer solution  0.5 mg Nebulization BID  . chlorhexidine  15 mL Mouth  Rinse BID  . enoxaparin (LOVENOX) injection  40 mg Subcutaneous Q24H  . folic acid  1 mg Oral Daily  . ipratropium-albuterol  3 mL Nebulization Q6H  . mouth rinse  15 mL Mouth Rinse q12n4p  . methylPREDNISolone (SOLU-MEDROL) injection  60 mg Intravenous Q6H  . multivitamin with minerals  1 tablet Oral Daily  . thiamine  100 mg Oral Daily   Continuous Infusions:  Procedures/Studies: Dg Chest Port 1 View  Result Date: 04/21/2017 CLINICAL DATA:  Acute on chronic respiratory failure with hypoxia and hypercapnia (HCC) EXAM: PORTABLE CHEST 1 VIEW COMPARISON:  Chest x-rays dated 04/18/2017 and 05/16/2016. FINDINGS: Mild cardiomegaly is stable. Atherosclerotic changes noted at the aortic arch. Lungs are hyperexpanded. Chronic bronchitic changes centrally. No confluent opacity to suggest a developing pneumonia. No pleural effusion or pneumothorax seen. No acute or suspicious osseous finding. IMPRESSION: 1. No active disease.  No evidence of pneumonia or pulmonary edema. 2. Hyperexpanded lungs indicating COPD. Associated chronic bronchitic changes. 3. Aortic atherosclerosis. 4. Stable mild cardiomegaly. Electronically Signed   By: Franki Cabot M.D.   On: 04/21/2017 09:33   Dg Chest Port 1 View  Result Date: 04/18/2017 CLINICAL DATA:  COPD. EXAM: PORTABLE CHEST 1 VIEW COMPARISON:  04/16/2017. FINDINGS: Mediastinum and hilar structures are normal. Mild bibasilar subsegmental atelectasis. No pleural effusion or pneumothorax. Heart size normal. No acute bony abnormality. IMPRESSION: Mild bibasilar subsegmental atelectasis. Electronically Signed   By: Marcello Moores  Register   On: 04/18/2017 12:04   Dg Chest Port 1 View  Result Date: 04/16/2017 CLINICAL DATA:  Short of breath EXAM: PORTABLE CHEST 1 VIEW COMPARISON:  01/31/2017 FINDINGS: Hyperinflation with emphysematous disease. No focal airspace disease or pleural effusion. Normal cardiomediastinal silhouette. Aortic atherosclerosis. No pneumothorax. IMPRESSION:  Hyperinflation with emphysematous disease. Electronically Signed   By: Donavan Foil M.D.   On: 04/16/2017 16:30    Orson Eva, DO  [facility] Pager [phone]  If 7PM-7AM, please contact night-coverage [email or web address] Password [email or web address] 04/22/2017, 9:03 AM   LOS: 6 days

## 2017-04-22 NOTE — Discharge Summary (Signed)
Physician Discharge Summary  Robert [name] TWS:[id number] DOB: 11/04/1955 DOA: 04/16/2017  PCP: Claggett, Elin, PA-C  Admit date: 04/16/2017 Discharge date: 04/23/2017  Admitted From: Home Disposition:  Home   Recommendations for Outpatient Follow-up:  1. Follow up with PCP in 1-2 weeks 2. Please obtain BMP/CBC in one week    Discharge Condition: Stable CODE STATUS: FULL Diet recommendation: Heart Healthy  Brief/Interim Summary: 62 year old male with a history of COPD, chronic respiratory failure on 3 L, anxiety, and tobacco abuse presenting with 5-day history of increasing coughing, shortness of breath, and generalized weakness. The patient denied any fevers, chills, chest pain, nausea, vomiting, diarrhea. Patient states that he checked his oxygen saturation at home and noted it was in the 80s. He has had some increase in his lower extremity edema. He denies any hemoptysis, abdominal pain, dysuria, hematuria. The patient continues to smoke approximately 3-4 cigarettes every 2-3 days. He has a 50-pack-year history. The patient was given intravenous steroids and bronchodilators and admitted for treatment for COPD exacerbation. In the early morning 04/19/2017, the patient developed respiratory distress. He was moved to the stepdown unit and placed on BiPAP.    Discharge Diagnoses:  COPD exacerbation -Continued Pulmicort during the hospitalization -Continued duo nebs -continue IV steroids>>>home with prednisone taper -continuedbrovana during the hospitalization -finished6 days azithromycin 4/15 -04/19/17 am--developed respiratory distress-->placed on BiPAP -Viral respiratory panel--neg -now off BiPAP >24 hours  Acute onChronic respiratory failure with hypoxia -Normally on 3 L at home -now off BiPAP >48 hours prior to d/c -weaned back to 3L -Personally reviewed EKG-sinus rhythm, nonspecific T wave change -personally reviewed 4/14CXR--increase interstitial markings  improving -Echo--poor quality. EF 60-65%. Trivial MR/TR -give lasix 40 mg x 1on 4/12 -give lasix 20 mg IV x 1 4/14  Delirium Tremens/Acute metabolic encephalopathy -likely delirium tremens -now improving, back to baseline -high dose thiamine>>>transition to 100mg  daily -check UA--no pyruia -check B12--557 -check TSH0.477, ammonia<9  Tobacco abuse -I have discussed tobacco cessation with the patient. I have counseled the patient regarding the negative impacts of continued tobacco use including but not limited to lung cancer, COPD, and cardiovascular disease. I have discussed alternatives to tobacco and modalities that may help facilitate tobacco cessation including but not limited to biofeedback, hypnosis, and medications. Total time spent with tobacco counseling was 4 min  Alcohol abuse -CIWA  Hyperkalemia -improved with lasix     Discharge Instructions   Allergies as of 04/23/2017   No Known Allergies     Medication List    TAKE these medications   albuterol 108 (90 Base) MCG/ACT inhaler Commonly known as:  PROVENTIL HFA;VENTOLIN HFA Inhale 2 puffs into the lungs every 4 (four) hours as needed for wheezing or shortness of breath.   aspirin EC 81 MG tablet Take 1 tablet (81 mg total) by mouth daily.   CLEAR EYES OP Place 2 drops into both eyes daily as needed (red eyes).   diphenhydrAMINE 25 mg capsule Commonly known as:  BENADRYL Take 25 mg by mouth at bedtime as needed for sleep.   ipratropium-albuterol 0.5-2.5 (3) MG/3ML Soln Commonly known as:  DUONEB Take 3 mLs by nebulization every 6 (six) hours as needed (shortness of breath).   OXYGEN Inhale 3-3.5 L into the lungs daily.   predniSONE 10 MG tablet Commonly known as:  DELTASONE Take 6 tablets (60 mg total) by mouth daily with breakfast. And decrease by one tablet daily Start taking on:  04/24/2017 What changed:    how much to take  when to take this  additional instructions   sodium  chloride 0.65 % Soln nasal spray Commonly known as:  OCEAN Place 1 spray into both nostrils as needed for congestion.   TRELEGY ELLIPTA 100-62.5-25 MCG/INH Aepb Generic drug:  Fluticasone-Umeclidin-Vilant Inhale 1 puff into the lungs every evening.       No Known Allergies  Consultations:  none   Procedures/Studies: Dg Chest Port 1 View  Result Date: 04/21/2017 CLINICAL DATA:  Acute on chronic respiratory failure with hypoxia and hypercapnia (HCC) EXAM: PORTABLE CHEST 1 VIEW COMPARISON:  Chest x-rays dated 04/18/2017 and 05/16/2016. FINDINGS: Mild cardiomegaly is stable. Atherosclerotic changes noted at the aortic arch. Lungs are hyperexpanded. Chronic bronchitic changes centrally. No confluent opacity to suggest a developing pneumonia. No pleural effusion or pneumothorax seen. No acute or suspicious osseous finding. IMPRESSION: 1. No active disease.  No evidence of pneumonia or pulmonary edema. 2. Hyperexpanded lungs indicating COPD. Associated chronic bronchitic changes. 3. Aortic atherosclerosis. 4. Stable mild cardiomegaly. Electronically Signed   By: Franki Cabot M.D.   On: 04/21/2017 09:33   Dg Chest Port 1 View  Result Date: 04/18/2017 CLINICAL DATA:  COPD. EXAM: PORTABLE CHEST 1 VIEW COMPARISON:  04/16/2017. FINDINGS: Mediastinum and hilar structures are normal. Mild bibasilar subsegmental atelectasis. No pleural effusion or pneumothorax. Heart size normal. No acute bony abnormality. IMPRESSION: Mild bibasilar subsegmental atelectasis. Electronically Signed   By: Marcello Moores  Register   On: 04/18/2017 12:04   Dg Chest Port 1 View  Result Date: 04/16/2017 CLINICAL DATA:  Short of breath EXAM: PORTABLE CHEST 1 VIEW COMPARISON:  01/31/2017 FINDINGS: Hyperinflation with emphysematous disease. No focal airspace disease or pleural effusion. Normal cardiomediastinal silhouette. Aortic atherosclerosis. No pneumothorax. IMPRESSION: Hyperinflation with emphysematous disease. Electronically  Signed   By: Donavan Foil M.D.   On: 04/16/2017 16:30        Discharge Exam: Vitals:   04/23/17 0800 04/23/17 0805  BP:    Pulse:    Resp:    Temp:    SpO2: 100% 100%   Vitals:   04/23/17 0447 04/23/17 0754 04/23/17 0800 04/23/17 0805  BP: 135/72     Pulse: 88     Resp: 18     Temp: 97.9 F (36.6 C)     TempSrc: Oral     SpO2: 93% 98% 100% 100%  Weight: 80.6 kg (177 lb 12.8 oz)     Height:        General: Pt is alert, awake, not in acute distress Cardiovascular: RRR, S1/S2 +, no rubs, no gallops Respiratory: CTA bilaterally, no wheezing, no rhonchi Abdominal: Soft, NT, ND, bowel sounds + Extremities: no edema, no cyanosis   The results of significant diagnostics from this hospitalization (including imaging, microbiology, ancillary and laboratory) are listed below for reference.    Significant Diagnostic Studies: Dg Chest Port 1 View  Result Date: 04/21/2017 CLINICAL DATA:  Acute on chronic respiratory failure with hypoxia and hypercapnia (HCC) EXAM: PORTABLE CHEST 1 VIEW COMPARISON:  Chest x-rays dated 04/18/2017 and 05/16/2016. FINDINGS: Mild cardiomegaly is stable. Atherosclerotic changes noted at the aortic arch. Lungs are hyperexpanded. Chronic bronchitic changes centrally. No confluent opacity to suggest a developing pneumonia. No pleural effusion or pneumothorax seen. No acute or suspicious osseous finding. IMPRESSION: 1. No active disease.  No evidence of pneumonia or pulmonary edema. 2. Hyperexpanded lungs indicating COPD. Associated chronic bronchitic changes. 3. Aortic atherosclerosis. 4. Stable mild cardiomegaly. Electronically Signed   By: Franki Cabot M.D.   On: 04/21/2017 09:33  Dg Chest Port 1 View  Result Date: 04/18/2017 CLINICAL DATA:  COPD. EXAM: PORTABLE CHEST 1 VIEW COMPARISON:  04/16/2017. FINDINGS: Mediastinum and hilar structures are normal. Mild bibasilar subsegmental atelectasis. No pleural effusion or pneumothorax. Heart size normal. No acute  bony abnormality. IMPRESSION: Mild bibasilar subsegmental atelectasis. Electronically Signed   By: Marcello Moores  Register   On: 04/18/2017 12:04   Dg Chest Port 1 View  Result Date: 04/16/2017 CLINICAL DATA:  Short of breath EXAM: PORTABLE CHEST 1 VIEW COMPARISON:  01/31/2017 FINDINGS: Hyperinflation with emphysematous disease. No focal airspace disease or pleural effusion. Normal cardiomediastinal silhouette. Aortic atherosclerosis. No pneumothorax. IMPRESSION: Hyperinflation with emphysematous disease. Electronically Signed   By: Donavan Foil M.D.   On: 04/16/2017 16:30     Microbiology: Recent Results (from the past 240 hour(s))  Respiratory Panel by PCR     Status: None   Collection Time: 04/17/17  9:10 AM  Result Value Ref Range Status   Adenovirus NOT DETECTED NOT DETECTED Final   Coronavirus 229E NOT DETECTED NOT DETECTED Final   Coronavirus HKU1 NOT DETECTED NOT DETECTED Final   Coronavirus NL63 NOT DETECTED NOT DETECTED Final   Coronavirus OC43 NOT DETECTED NOT DETECTED Final   Metapneumovirus NOT DETECTED NOT DETECTED Final   Rhinovirus / Enterovirus NOT DETECTED NOT DETECTED Final   Influenza A NOT DETECTED NOT DETECTED Final   Influenza B NOT DETECTED NOT DETECTED Final   Parainfluenza Virus 1 NOT DETECTED NOT DETECTED Final   Parainfluenza Virus 2 NOT DETECTED NOT DETECTED Final   Parainfluenza Virus 3 NOT DETECTED NOT DETECTED Final   Parainfluenza Virus 4 NOT DETECTED NOT DETECTED Final   Respiratory Syncytial Virus NOT DETECTED NOT DETECTED Final   Bordetella pertussis NOT DETECTED NOT DETECTED Final   Chlamydophila pneumoniae NOT DETECTED NOT DETECTED Final   Mycoplasma pneumoniae NOT DETECTED NOT DETECTED Final    Comment: Performed at [facility], [address] [place], [place] [address]  MRSA PCR Screening     Status: None   Collection Time: 04/19/17  5:18 AM  Result Value Ref Range Status   MRSA by PCR NEGATIVE NEGATIVE Final    Comment:        The  GeneXpert MRSA Assay (FDA approved for NASAL specimens only), is one component of a comprehensive MRSA colonization surveillance program. It is not intended to diagnose MRSA infection nor to guide or monitor treatment for MRSA infections. Performed at [facility], [address], [place], [place] [address]      Labs: Basic Metabolic Panel: Recent Labs  Lab 04/18/17 0403 04/19/17 0413 04/20/17 0423 04/21/17 0501 04/22/17 0442 04/23/17 0545  NA 139 138 137 142 136 136  K 4.5 5.2* 4.4 4.3 3.8 4.1  CL 99* 96* 93* 95* 91* 91*  CO2 29 32 37* 38* 34* 35*  GLUCOSE 145* 122* 135* 123* 131* 104*  BUN 17 17 28* 31* 26* 27*  CREATININE 0.88 0.85 0.99 0.84 0.93 0.87  CALCIUM 9.5 9.3 9.2 9.4 9.1 9.1  MG 2.2  --   --   --   --  2.3   Liver Function Tests: Recent Labs  Lab 04/17/17 0539 04/18/17 1154  AST 22 29  ALT 19 25  ALKPHOS 46 45  BILITOT 0.7 0.3  PROT 7.3 7.8  ALBUMIN 4.1 4.5   No results for input(s): LIPASE, AMYLASE in the last 168 hours. Recent Labs  Lab 04/18/17 1154  AMMONIA <9*   CBC: Recent Labs  Lab  04/16/17 1546 04/17/17 0539 04/21/17 0501  WBC 7.4 4.3 8.2  HGB 13.2 12.6* 11.7*  HCT 41.5 39.2 37.1*  MCV 93.3 92.9 93.9  PLT 327 316 297   Cardiac Enzymes: Recent Labs  Lab 04/16/17 1609  TROPONINI <0.03   BNP: Invalid input(s): POCBNP CBG: No results for input(s): GLUCAP in the last 168 hours.  Time coordinating discharge:  36 minutes  Signed:  Orson Eva, DO [facility] Pager: [phone] 04/23/2017, 11:02 AM

## 2017-04-22 NOTE — Progress Notes (Signed)
Patient is up in chair on 6 lpm oxygen and is asleep. He is off BiPAP and will titrate oxygen down. He has been talking non stop with family most of night till this point.

## 2017-04-22 NOTE — Progress Notes (Signed)
0728 Patient assisted OOB up to chair, patient tolerated well. Report received that patient remained off BiPAP all night last night. Patient eager to go home. Patient maintaining O2 SATs @ 98% on [place] @4L  which is his home baseline O2 liter delivery.

## 2017-04-22 NOTE — Plan of Care (Signed)
Patient remained off BiPAP all night last night & currently @ baseline O2 liter delivery of 4L via [place]. Patient noted still coughing at times but improved. Patient tolerating ambulating better at this time. O2 SATs 98% [place] @ 4L and dropped to 91% while ambulating to chair this AM.

## 2017-04-23 LAB — BASIC METABOLIC PANEL
Anion gap: 10 (ref 5–15)
BUN: 27 mg/dL — ABNORMAL HIGH (ref 6–20)
CALCIUM: 9.1 mg/dL (ref 8.9–10.3)
CO2: 35 mmol/L — AB (ref 22–32)
CREATININE: 0.87 mg/dL (ref 0.61–1.24)
Chloride: 91 mmol/L — ABNORMAL LOW (ref 101–111)
GFR calc Af Amer: >60 mL/min (ref >60)
GFR calc non Af Amer: >60 mL/min (ref >60)
Glucose, Bld: 104 mg/dL — ABNORMAL HIGH (ref 65–99)
Potassium: 4.1 mmol/L (ref 3.5–5.1)
Sodium: 136 mmol/L (ref 135–145)

## 2017-04-23 LAB — MAGNESIUM: Magnesium: 2.3 mg/dL (ref 1.7–2.4)

## 2017-04-23 MED ORDER — PREDNISONE 10 MG PO TABS: 60.0000 mg | ORAL_TABLET | Freq: Every day | ORAL | 0 refills | Status: DC

## 2017-04-23 NOTE — Care Management Important Message (Signed)
Important Message  Patient Details  Name: COURT GRACIA MRN: [id number] Date of Birth: [birth year]   Medicare Important Message Given:  Yes    Sherald Barge, RN 04/23/2017, 11:41 AM

## 2017-04-23 NOTE — Care Management Note (Signed)
Case Management Note  Patient Details  Name: Robert [name] MRN: [id number] Date of Birth: 1955/07/26  Subjective/Objective:      Admitted with COPD. Pt form home, lives alone. Is ind with ADL's. Has neb machine for treatments. Is on home O2 cont pta through [place]. Pt has PCP. He has vehicle and liscence but does not drive. He asks friends and family to do shopping and take him to appointments. He is scared of running out of his O2 when he drives himself.               Action/Plan: DC home today with self care. CM contacted [employer] about potential for port concentrator so pt can have more independence. Due to billing cycles pt will not be eligible to change over to port concentrator for another 7 months. He does have option of getting O2 conserving devices which he says he already has. Pt will follow up with PCP later this year.   Expected Discharge Date:  04/23/17               Expected Discharge Plan:  Home/Self Care  In-House Referral:  NA  Discharge planning Services  CM Consult  Post Acute Care Choice:  NA Choice offered to:  NA  Status of Service:  Completed, signed off  If discussed at [place] of Stay Meetings, dates discussed:  04/23/2017  Sherald Barge, RN 04/23/2017, 11:34 AM

## 2017-04-23 NOTE — Progress Notes (Signed)
Patient's IV removed.  Site WNL.  AVS reviewed with patient - verbalized understanding of discharge instructions, physician follow-up, medications.  Patient transported via wheelchair to main entrance at discharge.  Patient stable at time of discharge.

## 2017-04-23 NOTE — Progress Notes (Signed)
Patient requesting his 0800 breathing treatment now.  RT notified.

## 2017-04-23 NOTE — Care Management Note (Signed)
Case Management Note  Patient Details  Name: Robert [name] MRN: [id number] Date of Birth: [birth year]   If discussed at [place] Length of Stay Meetings, dates discussed:   04/23/2017 Additional Comments:  Sakari Raisanen, Chauncey [place], RN 04/23/2017, 12:04 PM

## 2017-09-21 ENCOUNTER — Encounter (HOSPITAL_COMMUNITY): Payer: Self-pay | Admitting: Emergency Medicine

## 2017-09-21 ENCOUNTER — Emergency Department (HOSPITAL_COMMUNITY)
Admission: EM | Admit: 2017-09-21 | Discharge: 2017-09-21 | Disposition: A | Discharge status: Home / Self Care | Payer: Medicare Other | Attending: Emergency Medicine | Admitting: Emergency Medicine

## 2017-09-21 ENCOUNTER — Other Ambulatory Visit: Payer: Self-pay

## 2017-09-21 DIAGNOSIS — Z79899 Other long term (current) drug therapy: Secondary | ICD-10-CM | POA: Insufficient documentation

## 2017-09-21 DIAGNOSIS — Z7982 Long term (current) use of aspirin: Secondary | ICD-10-CM | POA: Insufficient documentation

## 2017-09-21 DIAGNOSIS — F17210 Nicotine dependence, cigarettes, uncomplicated: Secondary | ICD-10-CM | POA: Insufficient documentation

## 2017-09-21 DIAGNOSIS — Z760 Encounter for issue of repeat prescription: Secondary | ICD-10-CM | POA: Diagnosis not present

## 2017-09-21 DIAGNOSIS — J449 Chronic obstructive pulmonary disease, unspecified: Secondary | ICD-10-CM | POA: Insufficient documentation

## 2017-09-21 MED ORDER — IPRATROPIUM-ALBUTEROL 0.5-2.5 (3) MG/3ML IN SOLN: 3.0000 mL | Freq: Four times a day (QID) | RESPIRATORY_TRACT | 3 refills | Status: DC | PRN

## 2017-09-21 NOTE — ED Provider Notes (Signed)
[facility] EMERGENCY DEPARTMENT Provider Note   CSN: [id number] Arrival date & time: 09/21/17  1141     History   Chief Complaint Chief Complaint  Patient presents with  . Medication Refill    HPI Robert [name] is a 62 y.o. male.  HPI   62 year old male presenting for medication refill.  Recently had most of his prescriptions filled but just used his last treatment of albuterol/ipratropium this morning.  He states that he uses them on a pretty much scheduled basis.  He is chronically on 4 L of oxygen at baseline.  He currently denies any acute symptoms but is concerned about his breathing worsening if he does not have this medication available to him.  Past Medical History:  Diagnosis Date  . Alcohol addiction ([place])   . Anxiety   . Bipolar 1 disorder ([place])   . COPD (chronic obstructive pulmonary disease) ([place])   . Depression   . High cholesterol   . Panic   . PTSD (post-traumatic stress disorder)     Patient Active Problem List   Diagnosis Date Noted  . Acute on chronic respiratory failure with hypoxia and hypercapnia (HCC)   . Delirium tremens ([place]) 04/18/2017  . Acute on chronic respiratory failure with hypoxia ([place]) 04/17/2017  . Tobacco abuse 04/17/2017  . TIA (transient ischemic attack) 02/16/2016  . COPD exacerbation ([place]) 10/31/2015  . COPD (chronic obstructive pulmonary disease) ([place])   . High cholesterol   . PTSD (post-traumatic stress disorder)   . Alcohol addiction ([place])   . Bipolar 1 disorder [facility])     Past Surgical History:  Procedure Laterality Date  . TONSILLECTOMY          Home Medications    Prior to Admission medications   Medication Sig Start Date End Date Taking? Authorizing Provider  albuterol (PROVENTIL HFA;VENTOLIN HFA) 108 (90 BASE) MCG/ACT inhaler Inhale 2 puffs into the lungs every 4 (four) hours as needed for wheezing or shortness of breath. 09/16/11 05/16/17  Jola Schmidt, MD  aspirin EC 81 MG tablet Take 1 tablet (81 mg  total) by mouth daily. 02/17/16   Isaac Bliss, Rayford Halsted, MD  diphenhydrAMINE (BENADRYL) 25 mg capsule Take 25 mg by mouth at bedtime as needed for sleep.     [provider]  ipratropium-albuterol (DUONEB) 0.5-2.5 (3) MG/3ML SOLN Take 3 mLs by nebulization every 6 (six) hours as needed (shortness of breath). 09/21/17   [name], MD  Naphazoline HCl (CLEAR EYES OP) Place 2 drops into both eyes daily as needed (red eyes).    [provider]  OXYGEN Inhale 3-3.5 L into the lungs daily.     [provider]  predniSONE (DELTASONE) 10 MG tablet Take 6 tablets (60 mg total) by mouth daily with breakfast. And decrease by one tablet daily 04/24/17   Tat, Shanon Brow, MD  sodium chloride (OCEAN) 0.65 % SOLN nasal spray Place 1 spray into both nostrils as needed for congestion. 11/20/14   Forde Dandy, MD  TRELEGY ELLIPTA 100-62.5-25 MCG/INH AEPB Inhale 1 puff into the lungs every evening. 04/08/17   [provider]    Family History Family History  Problem Relation Age of Onset  . Coronary artery disease Mother   . Coronary artery disease Father     Social History Social History   Tobacco Use  . Smoking status: Current Some Day Smoker    Packs/day: 0.50    Years: 45.00    Pack years: 22.50  Types: Cigarettes  . Smokeless tobacco: Never Used  Substance Use Topics  . Alcohol use: No    Frequency: Never  . Drug use: No     Allergies   Patient has no known allergies.   Review of Systems Review of Systems  All systems reviewed and negative, other than as noted in HPI.  Physical Exam Updated Vital Signs BP (!) 157/73 (BP Location: Right Arm)   Pulse 89   Temp 98.1 F (36.7 C) (Oral)   Resp 18   Ht 5\' 8"  (1.727 m)   Wt 86.2 kg   SpO2 100%   BMI 28.89 kg/m   Physical Exam  Constitutional: He appears well-developed and well-nourished. No distress.  HENT:  Head: Normocephalic and atraumatic.  Eyes: Conjunctivae are normal. Right eye  exhibits no discharge. Left eye exhibits no discharge.  Neck: Neck supple.  Cardiovascular: Normal rate, regular rhythm and normal heart sounds. Exam reveals no gallop and no friction rub.  No murmur heard. Pulmonary/Chest: Effort normal. No respiratory distress. He has wheezes.  Abdominal: Soft. He exhibits no distension. There is no tenderness.  Musculoskeletal: He exhibits no edema or tenderness.  Neurological: He is alert.  Skin: Skin is warm and dry.  Psychiatric: He has a normal mood and affect. His behavior is normal. Thought content normal.  Nursing note and vitals reviewed.    ED Treatments / Results  Labs (all labs ordered are listed, but only abnormal results are displayed) Labs Reviewed - No data to display  EKG None  Radiology No results found.  Procedures Procedures (including critical care time)  Medications Ordered in ED Medications - No data to display   Initial Impression / Assessment and Plan / ED Course  I have reviewed the triage vital signs and the nursing notes.  Pertinent labs & imaging results that were available during my care of the patient were reviewed by me and considered in my medical decision making (see chart for details).    62 year old male presenting for medication refill.  He does have wheezing on exam but hass underlying history of COPD and is oxygen dependent.  Oxygen saturations are in the high 90s on his home requirement.  He was offered a DuoNeb here in the emergency room but he is declining.  He was provided with a prescription.  Final Clinical Impressions(s) / ED Diagnoses   Final diagnoses:  Medication refill    ED Discharge Orders         Ordered    ipratropium-albuterol (DUONEB) 0.5-2.5 (3) MG/3ML SOLN  Every 6 hours PRN     09/21/17 1201           [name], MD 09/21/17 1208

## 2017-09-21 NOTE — ED Triage Notes (Signed)
Patient seen by PCP yesterday for refill in medications. Per patient had inhaler refilled but PCP missed to refill Duoneb in which he states has to use q6hrs. Patient states last dose was this morning at 5am.

## 2017-10-31 ENCOUNTER — Emergency Department (HOSPITAL_COMMUNITY)
Admission: EM | Admit: 2017-10-31 | Discharge: 2017-10-31 | Disposition: A | Discharge status: Home / Self Care | Payer: Medicare Other | Attending: Emergency Medicine | Admitting: Emergency Medicine

## 2017-10-31 ENCOUNTER — Emergency Department (HOSPITAL_COMMUNITY): Payer: Medicare Other

## 2017-10-31 ENCOUNTER — Other Ambulatory Visit: Payer: Self-pay

## 2017-10-31 ENCOUNTER — Encounter (HOSPITAL_COMMUNITY): Payer: Self-pay | Admitting: *Deleted

## 2017-10-31 DIAGNOSIS — Z8673 Personal history of transient ischemic attack (TIA), and cerebral infarction without residual deficits: Secondary | ICD-10-CM | POA: Diagnosis not present

## 2017-10-31 DIAGNOSIS — F17210 Nicotine dependence, cigarettes, uncomplicated: Secondary | ICD-10-CM | POA: Diagnosis not present

## 2017-10-31 DIAGNOSIS — J441 Chronic obstructive pulmonary disease with (acute) exacerbation: Secondary | ICD-10-CM | POA: Diagnosis not present

## 2017-10-31 DIAGNOSIS — Z7982 Long term (current) use of aspirin: Secondary | ICD-10-CM | POA: Diagnosis not present

## 2017-10-31 DIAGNOSIS — R0602 Shortness of breath: Secondary | ICD-10-CM | POA: Diagnosis present

## 2017-10-31 DIAGNOSIS — Z79899 Other long term (current) drug therapy: Secondary | ICD-10-CM | POA: Diagnosis not present

## 2017-10-31 LAB — CBC
HEMATOCRIT: 39.7 % (ref 39.0–52.0)
Hemoglobin: 12 g/dL — ABNORMAL LOW (ref 13.0–17.0)
MCH: 28.4 pg (ref 26.0–34.0)
MCHC: 30.2 g/dL (ref 30.0–36.0)
MCV: 94.1 fL (ref 80.0–100.0)
Platelets: 264 10*3/uL (ref 150–400)
RBC: 4.22 MIL/uL (ref 4.22–5.81)
RDW: 12.9 % (ref 11.5–15.5)
WBC: 7.7 10*3/uL (ref 4.0–10.5)
nRBC: 0 % (ref 0.0–0.2)

## 2017-10-31 LAB — BASIC METABOLIC PANEL
Anion gap: 6 (ref 5–15)
BUN: 15 mg/dL (ref 8–23)
CALCIUM: 9.7 mg/dL (ref 8.9–10.3)
CHLORIDE: 98 mmol/L (ref 98–111)
CO2: 34 mmol/L — AB (ref 22–32)
CREATININE: 0.82 mg/dL (ref 0.61–1.24)
GFR calc Af Amer: >60 mL/min (ref >60)
GFR calc non Af Amer: >60 mL/min (ref >60)
Glucose, Bld: 88 mg/dL (ref 70–99)
Potassium: 4.3 mmol/L (ref 3.5–5.1)
SODIUM: 138 mmol/L (ref 135–145)

## 2017-10-31 MED ORDER — PREDNISONE 20 MG PO TABS: ORAL_TABLET | ORAL | 0 refills | Status: DC

## 2017-10-31 MED ORDER — PREDNISONE 50 MG PO TABS
60.0000 mg | ORAL_TABLET | Freq: Once | ORAL | Status: AC
  Administered 2017-10-31: 19:00:00 60 mg via ORAL
  Filled 2017-10-31: qty 1

## 2017-10-31 MED ORDER — ALBUTEROL (5 MG/ML) CONTINUOUS INHALATION SOLN
10.0000 mg/h | INHALATION_SOLUTION | Freq: Once | RESPIRATORY_TRACT | Status: AC
  Administered 2017-10-31: 10 mg/h via RESPIRATORY_TRACT
  Filled 2017-10-31: qty 20

## 2017-10-31 MED ORDER — IPRATROPIUM BROMIDE 0.02 % IN SOLN
1.0000 mg | Freq: Once | RESPIRATORY_TRACT | Status: AC
  Administered 2017-10-31: 1 mg via RESPIRATORY_TRACT

## 2017-10-31 MED ORDER — ALBUTEROL SULFATE (2.5 MG/3ML) 0.083% IN NEBU
5.0000 mg | INHALATION_SOLUTION | Freq: Once | RESPIRATORY_TRACT | Status: AC
  Administered 2017-10-31: 5 mg via RESPIRATORY_TRACT
  Filled 2017-10-31: qty 6

## 2017-10-31 MED ORDER — IPRATROPIUM BROMIDE 0.02 % IN SOLN
RESPIRATORY_TRACT | Status: AC
  Filled 2017-10-31: qty 5

## 2017-10-31 NOTE — Discharge Instructions (Addendum)
Use your DuoNeb every 4 hours.  If needed more than every 4 hours return to the emergency department or contact your primary care physician.  Ask your primary care physician to help you to stop smoking

## 2017-10-31 NOTE — ED Notes (Signed)
200 ml UOP.

## 2017-10-31 NOTE — ED Provider Notes (Addendum)
[facility] EMERGENCY DEPARTMENT Provider Note   CSN: [id number] Arrival date & time: 10/31/17  1743     History   Chief Complaint Chief Complaint  Patient presents with  . Shortness of Breath    HPI Robert [name] is a 62 y.o. male.  Signs of shortness of breath typical of COPD onset 2 days ago.  He admits to slight cough.  Denies any fever.  Treating himself with DuoNeb at home with minimal relief.  No other associated symptoms nothing makes symptoms better or worse.  HPI  Past Medical History:  Diagnosis Date  . Alcohol addiction ([place])   . Anxiety   . Bipolar 1 disorder ([place])   . COPD (chronic obstructive pulmonary disease) ([place])   . Depression   . High cholesterol   . Panic   . PTSD (post-traumatic stress disorder)     Patient Active Problem List   Diagnosis Date Noted  . Acute on chronic respiratory failure with hypoxia and hypercapnia (HCC)   . Delirium tremens ([place]) 04/18/2017  . Acute on chronic respiratory failure with hypoxia ([place]) 04/17/2017  . Tobacco abuse 04/17/2017  . TIA (transient ischemic attack) 02/16/2016  . COPD exacerbation ([place]) 10/31/2015  . COPD (chronic obstructive pulmonary disease) ([place])   . High cholesterol   . PTSD (post-traumatic stress disorder)   . Alcohol addiction ([place])   . Bipolar 1 disorder [facility])     Past Surgical History:  Procedure Laterality Date  . TONSILLECTOMY          Home Medications    Prior to Admission medications   Medication Sig Start Date End Date Taking? Authorizing Provider  albuterol (PROVENTIL HFA;VENTOLIN HFA) 108 (90 BASE) MCG/ACT inhaler Inhale 2 puffs into the lungs every 4 (four) hours as needed for wheezing or shortness of breath. 09/16/11 05/16/17  Jola Schmidt, MD  aspirin EC 81 MG tablet Take 1 tablet (81 mg total) by mouth daily. 02/17/16   Isaac Bliss, Rayford Halsted, MD  diphenhydrAMINE (BENADRYL) 25 mg capsule Take 25 mg by mouth at bedtime as needed for sleep.     [provider]    ipratropium-albuterol (DUONEB) 0.5-2.5 (3) MG/3ML SOLN Take 3 mLs by nebulization every 6 (six) hours as needed (shortness of breath). 09/21/17   [name], MD  Naphazoline HCl (CLEAR EYES OP) Place 2 drops into both eyes daily as needed (red eyes).    [provider]  OXYGEN Inhale 3-3.5 L into the lungs daily.     [provider]  predniSONE (DELTASONE) 10 MG tablet Take 6 tablets (60 mg total) by mouth daily with breakfast. And decrease by one tablet daily 04/24/17   Tat, Shanon Brow, MD  sodium chloride (OCEAN) 0.65 % SOLN nasal spray Place 1 spray into both nostrils as needed for congestion. 11/20/14   Forde Dandy, MD  TRELEGY ELLIPTA 100-62.5-25 MCG/INH AEPB Inhale 1 puff into the lungs every evening. 04/08/17   [provider]    Family History Family History  Problem Relation Age of Onset  . Coronary artery disease Mother   . Coronary artery disease Father     Social History Social History   Tobacco Use  . Smoking status: Current Some Day Smoker    Packs/day: 0.50    Years: 45.00    Pack years: 22.50    Types: Cigarettes  . Smokeless tobacco: Never Used  Substance Use Topics  . Alcohol use: No    Frequency: Never  . Drug use: No  Allergies   Patient has no known allergies.   Review of Systems Review of Systems  Constitutional: Negative.   HENT: Negative.   Respiratory: Positive for cough, shortness of breath and wheezing.   Cardiovascular: Negative.   Gastrointestinal: Negative.   Musculoskeletal: Negative.   Skin: Negative.   Neurological: Negative.   Psychiatric/Behavioral: Negative.   All other systems reviewed and are negative.    Physical Exam Updated Vital Signs BP (!) 147/93   Pulse (!) 105   Temp 98.4 F (36.9 C) (Oral)   Resp 20   Ht 5\' 8"  (1.727 m)   Wt 86.2 kg   SpO2 97%   BMI 28.89 kg/m   Physical Exam  Constitutional:  Tonically ill-appearing no respiratory distress  HENT:  Head: Normocephalic and  atraumatic.  Eyes: Pupils are equal, round, and reactive to light. Conjunctivae are normal.  Neck: Neck supple. No tracheal deviation present. No thyromegaly present.  Cardiovascular: Regular rhythm.  No murmur heard. Mildly tachycardic  Pulmonary/Chest: Effort normal. He has wheezes.  Expiratory wheezes  Abdominal: Soft. Bowel sounds are normal. He exhibits no distension. There is no tenderness.  Musculoskeletal: Normal range of motion. He exhibits no edema or tenderness.  Neurological: He is alert. Coordination normal.  Skin: Skin is warm and dry. No rash noted.  Psychiatric: He has a normal mood and affect.  Nursing note and vitals reviewed.    ED Treatments / Results  Labs (all labs ordered are listed, but only abnormal results are displayed) Labs Reviewed - No data to display  EKG None ED ECG REPORT   Date: 10/31/2017  Rate: 105  Rhythm: sinus tachycardia  QRS Axis: normal  Intervals: normal  ST/T Wave abnormalities: nonspecific T wave changes  Conduction Disutrbances:none  Narrative Interpretation:   Old EKG Reviewed: unchanged No significant change over 04/16/2017 I have personally reviewed the EKG tracing and agree with the computerized printout as noted. Radiology No results found.  Procedures Procedures (including critical care time)  Medications Ordered in ED Medications  predniSONE (DELTASONE) tablet 60 mg (has no administration in time range)  albuterol (PROVENTIL) (2.5 MG/3ML) 0.083% nebulizer solution 5 mg (5 mg Nebulization Given 10/31/17 1832)   7:50 PM patient states breathing improved after nebulizer treatment and prednisone however not at baseline.  Continuous nebulization ordered  Initial Impression / Assessment and Plan / ED Course  I have reviewed the triage vital signs and the nursing notes.  Pertinent labs & imaging results that were available during my care of the patient were reviewed by me and considered in my medical decision making (see  chart for details).     9:04 p.m. states breathing is improving steadily while getting continuous nebulization Chest x-ray viewed by me Results for orders placed or performed during the hospital encounter of 82/99/37  Basic metabolic panel  Result Value Ref Range   Sodium 138 135 - 145 mmol/L   Potassium 4.3 3.5 - 5.1 mmol/L   Chloride 98 98 - 111 mmol/L   CO2 34 (H) 22 - 32 mmol/L   Glucose, Bld 88 70 - 99 mg/dL   BUN 15 8 - 23 mg/dL   Creatinine, Ser 0.82 0.61 - 1.24 mg/dL   Calcium 9.7 8.9 - 10.3 mg/dL   GFR calc non Af Amer >60 >60 mL/min   GFR calc Af Amer >60 >60 mL/min   Anion gap 6 5 - 15  CBC  Result Value Ref Range   WBC 7.7 4.0 - 10.5 K/uL  RBC 4.22 4.22 - 5.81 MIL/uL   Hemoglobin 12.0 (L) 13.0 - 17.0 g/dL   HCT 39.7 39.0 - 52.0 %   MCV 94.1 80.0 - 100.0 fL   MCH 28.4 26.0 - 34.0 pg   MCHC 30.2 30.0 - 36.0 g/dL   RDW 12.9 11.5 - 15.5 %   Platelets 264 150 - 400 K/uL   nRBC 0.0 0.0 - 0.2 %   Dg Chest 2 View  Result Date: 10/31/2017 CLINICAL DATA:  Shortness of breath EXAM: CHEST - 2 VIEW COMPARISON:  April 11, 2017 FINDINGS: Hyperinflation of the lungs. Possible tiny effusions versus pleural thickening. No other interval changes or acute abnormalities. IMPRESSION: Hyperinflation of the lungs suggests COPD. Pleural thickening versus tiny effusions. No other change. Electronically Signed   By: Dorise Bullion III M.D   On: 10/31/2017 19:46  10:05 PM after 1 hour of continuous nebulization with albuterol and ipratropium, and oral prednisone patient states his breathing is at baseline.  He is able to walk around the emergency department minimal dyspnea.  Stating "this is far as I walked in a long time".  He feels ready to go home.  Plan prescription prednisone.  He is encouraged to use his DuoNeb every 4 hours as needed for shortness of breath.  Return if needed more than every 4 hours or see his doctor.  I counseled patient for 5 minutes on smoking cessation lab work  consistent with mild anemia which is chronic otherwise normal .  Mild sinus tachycardia and tachypnea on discharge likely secondary to nebulized treatments administered.  Does however feel ready to go home. CRITICAL CARE Performed by: Orlie Dakin Total critical care time: 30 minutes Critical care time was exclusive of separately billable procedures and treating other patients. Critical care was necessary to treat or prevent imminent or life-threatening deterioration. Critical care was time spent personally by me on the following activities: development of treatment plan with patient and/or surrogate as well as nursing, discussions with consultants, evaluation of patient's response to treatment, examination of patient, obtaining history from patient or surrogate, ordering and performing treatments and interventions, ordering and review of laboratory studies, ordering and review of radiographic studies, pulse oximetry and re-evaluation of patient's condition. Final Clinical Impressions(s) / ED Diagnoses  #1 COPD exacerbation Final diagnoses:  None   #2 tobacco abuse #3 anemia ED Discharge Orders    None       Orlie Dakin, MD 10/31/17 2217    Orlie Dakin, MD 10/31/17 2222

## 2017-10-31 NOTE — ED Notes (Signed)
Patient's drop in O2 reported to EDP. Oxygen increased, patient sent for CXR.

## 2017-10-31 NOTE — ED Notes (Signed)
Pt ambulated around the nurses station and back to room 12 on 4L O2 per Dr request. O2 dropped to 90% at the lowest point. HR 120,  resp 22

## 2017-10-31 NOTE — ED Notes (Signed)
250 ml UOP.

## 2017-10-31 NOTE — ED Triage Notes (Signed)
Shortness of breath increasing for the past 2 days

## 2017-10-31 NOTE — ED Notes (Signed)
Patient standing at bedside awaiting Radiology. Unable to transport in the bed.

## 2018-02-18 ENCOUNTER — Other Ambulatory Visit: Payer: Self-pay

## 2018-02-18 ENCOUNTER — Inpatient Hospital Stay (HOSPITAL_COMMUNITY)
Admission: EM | Admit: 2018-02-18 | Discharge: 2018-02-25 | DRG: 191 | Disposition: A | Discharge status: Home / Self Care | Payer: Medicare Other | Attending: Internal Medicine | Admitting: Internal Medicine

## 2018-02-18 ENCOUNTER — Emergency Department (HOSPITAL_COMMUNITY): Payer: Medicare Other

## 2018-02-18 ENCOUNTER — Encounter (HOSPITAL_COMMUNITY): Payer: Self-pay | Admitting: Emergency Medicine

## 2018-02-18 DIAGNOSIS — J069 Acute upper respiratory infection, unspecified: Secondary | ICD-10-CM | POA: Diagnosis present

## 2018-02-18 DIAGNOSIS — J441 Chronic obstructive pulmonary disease with (acute) exacerbation: Secondary | ICD-10-CM | POA: Diagnosis not present

## 2018-02-18 DIAGNOSIS — F17210 Nicotine dependence, cigarettes, uncomplicated: Secondary | ICD-10-CM | POA: Diagnosis present

## 2018-02-18 DIAGNOSIS — Z8673 Personal history of transient ischemic attack (TIA), and cerebral infarction without residual deficits: Secondary | ICD-10-CM

## 2018-02-18 DIAGNOSIS — Z7951 Long term (current) use of inhaled steroids: Secondary | ICD-10-CM

## 2018-02-18 DIAGNOSIS — Z720 Tobacco use: Secondary | ICD-10-CM | POA: Diagnosis present

## 2018-02-18 DIAGNOSIS — F1011 Alcohol abuse, in remission: Secondary | ICD-10-CM | POA: Diagnosis present

## 2018-02-18 DIAGNOSIS — F4310 Post-traumatic stress disorder, unspecified: Secondary | ICD-10-CM | POA: Diagnosis present

## 2018-02-18 DIAGNOSIS — J9611 Chronic respiratory failure with hypoxia: Secondary | ICD-10-CM

## 2018-02-18 DIAGNOSIS — Z7189 Other specified counseling: Secondary | ICD-10-CM

## 2018-02-18 DIAGNOSIS — B341 Enterovirus infection, unspecified: Secondary | ICD-10-CM | POA: Diagnosis present

## 2018-02-18 DIAGNOSIS — Z7982 Long term (current) use of aspirin: Secondary | ICD-10-CM

## 2018-02-18 DIAGNOSIS — Z9981 Dependence on supplemental oxygen: Secondary | ICD-10-CM

## 2018-02-18 DIAGNOSIS — Z8249 Family history of ischemic heart disease and other diseases of the circulatory system: Secondary | ICD-10-CM

## 2018-02-18 DIAGNOSIS — Z8659 Personal history of other mental and behavioral disorders: Secondary | ICD-10-CM

## 2018-02-18 DIAGNOSIS — Z66 Do not resuscitate: Secondary | ICD-10-CM | POA: Diagnosis not present

## 2018-02-18 DIAGNOSIS — Z7952 Long term (current) use of systemic steroids: Secondary | ICD-10-CM

## 2018-02-18 LAB — BASIC METABOLIC PANEL
ANION GAP: 10 (ref 5–15)
BUN: 17 mg/dL (ref 8–23)
CALCIUM: 9.1 mg/dL (ref 8.9–10.3)
CO2: 25 mmol/L (ref 22–32)
Chloride: 100 mmol/L (ref 98–111)
Creatinine, Ser: 0.68 mg/dL (ref 0.61–1.24)
GFR calc Af Amer: >60 mL/min (ref >60)
GLUCOSE: 113 mg/dL — AB (ref 70–99)
Potassium: 4.1 mmol/L (ref 3.5–5.1)
Sodium: 135 mmol/L (ref 135–145)

## 2018-02-18 LAB — CBC WITH DIFFERENTIAL/PLATELET
Abs Immature Granulocytes: 0.01 10*3/uL (ref 0.00–0.07)
BASOS PCT: 0 %
Basophils Absolute: 0 10*3/uL (ref 0.0–0.1)
EOS ABS: 0.1 10*3/uL (ref 0.0–0.5)
EOS PCT: 2 %
HEMATOCRIT: 41.1 % (ref 39.0–52.0)
Hemoglobin: 12.8 g/dL — ABNORMAL LOW (ref 13.0–17.0)
IMMATURE GRANULOCYTES: 0 %
LYMPHS ABS: 0.6 10*3/uL — AB (ref 0.7–4.0)
Lymphocytes Relative: 10 %
MCH: 29 pg (ref 26.0–34.0)
MCHC: 31.1 g/dL (ref 30.0–36.0)
MCV: 93.2 fL (ref 80.0–100.0)
MONO ABS: 0.4 10*3/uL (ref 0.1–1.0)
MONOS PCT: 7 %
Neutro Abs: 4.6 10*3/uL (ref 1.7–7.7)
Neutrophils Relative %: 81 %
PLATELETS: 242 10*3/uL (ref 150–400)
RBC: 4.41 MIL/uL (ref 4.22–5.81)
RDW: 12.5 % (ref 11.5–15.5)
WBC: 5.7 10*3/uL (ref 4.0–10.5)
nRBC: 0 % (ref 0.0–0.2)

## 2018-02-18 LAB — TROPONIN I: Troponin I: <0.03 ng/mL (ref <0.03)

## 2018-02-18 MED ORDER — SODIUM CHLORIDE 0.9 % IV SOLN
INTRAVENOUS | Status: DC
  Administered 2018-02-18: via INTRAVENOUS

## 2018-02-18 MED ORDER — ACETAMINOPHEN 325 MG PO TABS: 650.0000 mg | ORAL_TABLET | Freq: Four times a day (QID) | ORAL | Status: DC | PRN

## 2018-02-18 MED ORDER — ONDANSETRON HCL 4 MG PO TABS: 4.0000 mg | ORAL_TABLET | Freq: Four times a day (QID) | ORAL | Status: DC | PRN

## 2018-02-18 MED ORDER — ASPIRIN EC 81 MG PO TBEC
81.0000 mg | DELAYED_RELEASE_TABLET | Freq: Every day | ORAL | Status: DC
  Administered 2018-02-19 – 2018-02-25 (×7): 81 mg via ORAL
  Filled 2018-02-18 (×7): qty 1

## 2018-02-18 MED ORDER — ENOXAPARIN SODIUM 40 MG/0.4ML ~~LOC~~ SOLN
40.0000 mg | SUBCUTANEOUS | Status: DC
  Administered 2018-02-18 – 2018-02-24 (×7): 40 mg via SUBCUTANEOUS
  Filled 2018-02-18 (×7): qty 0.4

## 2018-02-18 MED ORDER — IPRATROPIUM BROMIDE 0.02 % IN SOLN
0.5000 mg | Freq: Once | RESPIRATORY_TRACT | Status: AC
  Administered 2018-02-18: 0.5 mg via RESPIRATORY_TRACT
  Filled 2018-02-18: qty 2.5

## 2018-02-18 MED ORDER — ALBUTEROL (5 MG/ML) CONTINUOUS INHALATION SOLN
10.0000 mg/h | INHALATION_SOLUTION | Freq: Once | RESPIRATORY_TRACT | Status: AC
  Administered 2018-02-18: 10 mg/h via RESPIRATORY_TRACT
  Filled 2018-02-18: qty 20

## 2018-02-18 MED ORDER — ONDANSETRON HCL 4 MG/2ML IJ SOLN: 4.0000 mg | Freq: Four times a day (QID) | INTRAMUSCULAR | Status: DC | PRN

## 2018-02-18 MED ORDER — METHYLPREDNISOLONE SODIUM SUCC 125 MG IJ SOLR
125.0000 mg | Freq: Once | INTRAMUSCULAR | Status: AC
  Administered 2018-02-18: 125 mg via INTRAVENOUS
  Filled 2018-02-18: qty 2

## 2018-02-18 MED ORDER — IPRATROPIUM-ALBUTEROL 0.5-2.5 (3) MG/3ML IN SOLN
3.0000 mL | Freq: Once | RESPIRATORY_TRACT | Status: DC
  Filled 2018-02-18: qty 3

## 2018-02-18 MED ORDER — DIPHENHYDRAMINE HCL 25 MG PO CAPS: 25.0000 mg | ORAL_CAPSULE | Freq: Every evening | ORAL | Status: DC | PRN

## 2018-02-18 MED ORDER — ACETAMINOPHEN 650 MG RE SUPP: 650.0000 mg | Freq: Four times a day (QID) | RECTAL | Status: DC | PRN

## 2018-02-18 MED ORDER — METHYLPREDNISOLONE SODIUM SUCC 125 MG IJ SOLR
60.0000 mg | Freq: Four times a day (QID) | INTRAMUSCULAR | Status: DC
  Administered 2018-02-19 – 2018-02-25 (×26): 60 mg via INTRAVENOUS
  Filled 2018-02-18 (×26): qty 2

## 2018-02-18 MED ORDER — DULOXETINE HCL 30 MG PO CPEP
30.0000 mg | ORAL_CAPSULE | Freq: Two times a day (BID) | ORAL | Status: DC
  Administered 2018-02-18 – 2018-02-25 (×14): 30 mg via ORAL
  Filled 2018-02-18 (×14): qty 1

## 2018-02-18 MED ORDER — GUAIFENESIN ER 600 MG PO TB12
600.0000 mg | ORAL_TABLET | Freq: Two times a day (BID) | ORAL | Status: DC
  Administered 2018-02-18 – 2018-02-25 (×14): 600 mg via ORAL
  Filled 2018-02-18 (×14): qty 1

## 2018-02-18 MED ORDER — ORAL CARE MOUTH RINSE
15.0000 mL | Freq: Two times a day (BID) | OROMUCOSAL | Status: DC
  Administered 2018-02-18 – 2018-02-25 (×11): 15 mL via OROMUCOSAL

## 2018-02-18 MED ORDER — ALBUTEROL SULFATE (2.5 MG/3ML) 0.083% IN NEBU: 5.0000 mg | INHALATION_SOLUTION | Freq: Once | RESPIRATORY_TRACT | Status: DC

## 2018-02-18 MED ORDER — IPRATROPIUM-ALBUTEROL 0.5-2.5 (3) MG/3ML IN SOLN
3.0000 mL | Freq: Four times a day (QID) | RESPIRATORY_TRACT | Status: DC
  Administered 2018-02-19 (×4): 3 mL via RESPIRATORY_TRACT
  Filled 2018-02-18 (×5): qty 3

## 2018-02-18 NOTE — ED Provider Notes (Signed)
[facility] Provider Note   CSN: [id number] Arrival date & time: 02/18/18  1543     History   Chief Complaint Chief Complaint  Patient presents with  . Shortness of Breath    HPI Robert [name] is a 63 y.o. male.  HPI Patient presents to the emergency room for evaluation of shortness of breath.  Patient has a history of COPD.  He still continues to smoke but not as much as he used to.  Patient states for the last few days he has felt very short of breath.  He thought he might be coming down with the flu.  Today it is very hard for him to breathe.  He feels very tight in his lungs.  He denies any chest pain.  No known fevers.  No vomiting or diarrhea. Past Medical History:  Diagnosis Date  . Alcohol addiction ([place])   . Anxiety   . Bipolar 1 disorder ([place])   . COPD (chronic obstructive pulmonary disease) ([place])   . Depression   . High cholesterol   . Panic   . PTSD (post-traumatic stress disorder)     Patient Active Problem List   Diagnosis Date Noted  . Acute on chronic respiratory failure with hypoxia and hypercapnia (HCC)   . Delirium tremens ([place]) 04/18/2017  . Acute on chronic respiratory failure with hypoxia ([place]) 04/17/2017  . Tobacco abuse 04/17/2017  . TIA (transient ischemic attack) 02/16/2016  . COPD exacerbation ([place]) 10/31/2015  . COPD (chronic obstructive pulmonary disease) ([place])   . High cholesterol   . PTSD (post-traumatic stress disorder)   . Alcohol addiction ([place])   . Bipolar 1 disorder [facility])     Past Surgical History:  Procedure Laterality Date  . TONSILLECTOMY          Home Medications    Prior to Admission medications   Medication Sig Start Date End Date Taking? Authorizing Provider  albuterol (PROVENTIL HFA;VENTOLIN HFA) 108 (90 BASE) MCG/ACT inhaler Inhale 2 puffs into the lungs every 4 (four) hours as needed for wheezing or shortness of breath. 09/16/11 10/31/17  Jola Schmidt, MD  aspirin EC 81 MG tablet Take 1  tablet (81 mg total) by mouth daily. 02/17/16   Isaac Bliss, Rayford Halsted, MD  budesonide-formoterol [facility]) 160-4.5 MCG/ACT inhaler Inhale 2 puffs into the lungs 2 (two) times daily.    [provider]  diphenhydrAMINE (BENADRYL) 25 mg capsule Take 25 mg by mouth at bedtime as needed for sleep.     [provider]  ipratropium-albuterol (DUONEB) 0.5-2.5 (3) MG/3ML SOLN Take 3 mLs by nebulization every 6 (six) hours as needed (shortness of breath). 09/21/17   [name], MD  Naphazoline HCl (CLEAR EYES OP) Place 2 drops into both eyes daily as needed (red eyes).    [provider]  OXYGEN Inhale 3.5-4 L into the lungs continuous.     [provider]  predniSONE (DELTASONE) 20 MG tablet 2 tabs po daily x 4 days. Take daily at dinnertime.  First dose 11/01/2017 10/31/17   Orlie Dakin, MD  sodium chloride (OCEAN) 0.65 % SOLN nasal spray Place 1 spray into both nostrils as needed for congestion. 11/20/14   Forde Dandy, MD  TRELEGY ELLIPTA 100-62.5-25 MCG/INH AEPB Inhale 1 puff into the lungs every evening. 04/08/17   [provider]    Family History Family History  Problem Relation Age of Onset  . Coronary artery disease Mother   . Coronary artery disease Father  Social History Social History   Tobacco Use  . Smoking status: Current Some Day Smoker    Packs/day: 0.50    Years: 45.00    Pack years: 22.50    Types: Cigarettes  . Smokeless tobacco: Never Used  Substance Use Topics  . Alcohol use: No    Frequency: Never  . Drug use: No     Allergies   Patient has no known allergies.   Review of Systems Review of Systems  All other systems reviewed and are negative.    Physical Exam Updated Vital Signs BP 128/84 (BP Location: Right Arm)   Pulse (!) 106   Temp 98 F (36.7 C) (Oral)   Resp (!) 24   Ht 1.727 m (5\' 8" )   Wt 79.4 kg   SpO2 97%   BMI 26.61 kg/m   Physical Exam Vitals signs and nursing note  reviewed.  Constitutional:      General: He is not in acute distress.    Appearance: He is well-developed.  HENT:     Head: Normocephalic and atraumatic.     Right Ear: External ear normal.     Left Ear: External ear normal.  Eyes:     General: No scleral icterus.       Right eye: No discharge.        Left eye: No discharge.     Conjunctiva/sclera: Conjunctivae normal.  Neck:     Musculoskeletal: Neck supple.     Trachea: No tracheal deviation.  Cardiovascular:     Rate and Rhythm: Normal rate and regular rhythm.  Pulmonary:     Effort: Accessory muscle usage present. No respiratory distress.     Breath sounds: No stridor. Decreased breath sounds and wheezing present. No rales.  Abdominal:     General: Bowel sounds are normal. There is no distension.     Palpations: Abdomen is soft.     Tenderness: There is no abdominal tenderness. There is no guarding or rebound.  Musculoskeletal:        General: No tenderness.  Skin:    General: Skin is warm and dry.     Findings: No rash.  Neurological:     Mental Status: He is alert.     Cranial Nerves: No cranial nerve deficit (no facial droop, extraocular movements intact, no slurred speech).     Sensory: No sensory deficit.     Motor: No abnormal muscle tone or seizure activity.     Coordination: Coordination normal.      ED Treatments / Results  Labs (all labs ordered are listed, but only abnormal results are displayed) Labs Reviewed  CBC WITH DIFFERENTIAL/PLATELET - Abnormal; Notable for the following components:      Result Value   Hemoglobin 12.8 (*)    Lymphs Abs 0.6 (*)    All other components within normal limits  BASIC METABOLIC PANEL - Abnormal; Notable for the following components:   Glucose, Bld 113 (*)    All other components within normal limits  TROPONIN I    EKG None  Radiology Dg Chest 2 View  Result Date: 02/18/2018 CLINICAL DATA:  Patient complaining of cough, tightness in chest, and fever x 4  days, sob on exertion. COPD EXAM: CHEST - 2 VIEW COMPARISON:  10/31/2017 FINDINGS: Lungs hyperinflated. No focal infiltrate. Coarse perihilar bronchovascular markings. No edema. Heart size normal.  Aortic Atherosclerosis (ICD10-170.0). Chronic blunting of posterior costophrenic angles. Visualized bones unremarkable. IMPRESSION: Hyperinflation without acute or superimposed abnormality. Electronically Signed  By: Lucrezia Europe M.D.   On: 02/18/2018 16:39    Procedures Procedures (including critical care time)  Medications Ordered in ED Medications  methylPREDNISolone sodium succinate (SOLU-MEDROL) 125 mg/2 mL injection 125 mg (125 mg Intravenous Given 02/18/18 1857)  albuterol (PROVENTIL,VENTOLIN) solution continuous neb (10 mg/hr Nebulization Given 02/18/18 1825)  ipratropium (ATROVENT) nebulizer solution 0.5 mg (0.5 mg Nebulization Given 02/18/18 1826)     Initial Impression / Assessment and Plan / ED Course  I have reviewed the triage vital signs and the nursing notes.  Pertinent labs & imaging results that were available during my care of the patient were reviewed by me and considered in my medical decision making (see chart for details).  Clinical Course as of Feb 19 2011  Tue Feb 18, 2018  1950 Still receiving his treatment.  Wheezing noted on exam   [JK]  2013 Still wheezing.  Decreased air movement   [JK]    Clinical Course User Index [JK] Dorie Rank, MD    Patient presented to the emergency room for evaluation of cough, congestion and shortness of breath.  Patient is noted to have significant wheezing on exam.  Laboratory tests and x-rays without acute abnormalities.  Symptoms are consistent with a COPD exacerbation.  Patient continued to wheeze despite an hour-long treatment and steroids.  I think he would benefit from another hospital and further treatment.  Final Clinical Impressions(s) / ED Diagnoses   Final diagnoses:  COPD exacerbation ([place])      Dorie Rank,  MD 02/18/18 2014

## 2018-02-18 NOTE — ED Triage Notes (Addendum)
Patient complaining of cough, tightness in chest, and fever x 4 days. States he took ibuprofen at 1400 today.

## 2018-02-18 NOTE — H&P (Signed)
TRH H&P    Patient Demographics:    Robert [name], is a 63 y.o. male  MRN: [id number]  DOB - November 21, 1955  Admit Date - 02/18/2018  Referring MD/NP/PA: Marye Round  Outpatient Primary MD for the patient is [facility]  Patient coming from: Home  Chief complaint-shortness of breath   HPI:    Robert [name]  is a 63 y.o. male, with history of COPDOn chronic home O2 2 L/min, anxiety, tobacco abuse came to hospital with 2-day history of worsening shortness of breath.  Patient also complaining of upper respiratory symptoms including headache nasal congestion, sore throat.  This morning patient says that he was wheezing and had terrible headache.  He came to ED for further evaluation.  He also had low-grade temperature. In the ED, patient breathing improved with duo nebs.  He denies nausea vomiting or diarrhea. Complains of abdominal pain from coughing. Has not been able to cough up any phlegm. Denies dysuria     Review of systems:    In addition to the HPI above,    All other systems reviewed and are negative.    Past History of the following :    Past Medical History:  Diagnosis Date  . Alcohol addiction ([place])   . Anxiety   . Bipolar 1 disorder ([place])   . COPD (chronic obstructive pulmonary disease) ([place])   . Depression   . High cholesterol   . Panic   . PTSD (post-traumatic stress disorder)       Past Surgical History:  Procedure Laterality Date  . TONSILLECTOMY        Social History:      Social History   Tobacco Use  . Smoking status: Current Some Day Smoker    Packs/day: 0.50    Years: 45.00    Pack years: 22.50    Types: Cigarettes  . Smokeless tobacco: Never Used  Substance Use Topics  . Alcohol use: No    Frequency: Never       Family History :     Family History  Problem Relation Age of Onset  . Coronary artery disease Mother   .  Coronary artery disease Father       Home Medications:   Prior to Admission medications   Medication Sig Start Date End Date Taking? Authorizing Provider  albuterol (PROVENTIL HFA;VENTOLIN HFA) 108 (90 BASE) MCG/ACT inhaler Inhale 2 puffs into the lungs every 4 (four) hours as needed for wheezing or shortness of breath. 09/16/11 02/18/18 Yes Jola Schmidt, MD  aspirin EC 81 MG tablet Take 1 tablet (81 mg total) by mouth daily. 02/17/16  Yes Isaac Bliss, Rayford Halsted, MD  budesonide-formoterol [facility]) 160-4.5 MCG/ACT inhaler Inhale 2 puffs into the lungs 2 (two) times daily.   Yes [provider]  diphenhydrAMINE (BENADRYL) 25 mg capsule Take 25 mg by mouth at bedtime as needed for sleep.    Yes [provider]  DULoxetine (CYMBALTA) 30 MG capsule Take 30 mg by mouth 2 (two) times daily. 01/28/18  Yes  [provider]  EQ MUCUS ER 600 MG 12 hr tablet Take 600 mg by mouth 2 (two) times daily as needed for cough or to loosen phlegm.  11/08/17  Yes [provider]  ipratropium-albuterol (DUONEB) 0.5-2.5 (3) MG/3ML SOLN Take 3 mLs by nebulization every 6 (six) hours as needed (shortness of breath). Patient taking differently: Take 3 mLs by nebulization every 4 (four) hours.  09/21/17  Yes [name], MD  OXYGEN Inhale 3.5-4 L into the lungs continuous.    Yes [provider]  TRELEGY ELLIPTA 100-62.5-25 MCG/INH AEPB Inhale 1 puff into the lungs every evening. 04/08/17   [provider]     Allergies:    Not on File   Physical Exam:   Vitals  Blood pressure 128/84, pulse (!) 106, temperature 98 F (36.7 C), temperature source Oral, resp. rate (!) 24, height 5\' 8"  (1.727 m), weight 79.4 kg, SpO2 97 %.  1.  General: Appears in no acute distress  2. Psychiatric: Alert, oriented x3, intact insight and judgment  3. Neurologic: Cranial nerve II through grossly intact, motor strength 5/5 in all extremities  4. HEENMT:  Atraumatic  normocephalic, extraocular muscles intact, oral mucosa is pink and moist.  5. Respiratory : Bilateral wheezing auscultated.  6. Cardiovascular : S1-S2, regular, no murmur auscultated  7. Gastrointestinal:  Abdomen is soft, nontender, no organomegaly     Data Review:    CBC Recent Labs  Lab 02/18/18 1633  WBC 5.7  HGB 12.8*  HCT 41.1  PLT 242  MCV 93.2  MCH 29.0  MCHC 31.1  RDW 12.5  LYMPHSABS 0.6*  MONOABS 0.4  EOSABS 0.1  BASOSABS 0.0   ------------------------------------------------------------------------------------------------------------------  Results for orders placed or performed during the hospital encounter of 02/18/18 (from the past 48 hour(s))  CBC with Differential     Status: Abnormal   Collection Time: 02/18/18  4:33 PM  Result Value Ref Range   WBC 5.7 4.0 - 10.5 K/uL   RBC 4.41 4.22 - 5.81 MIL/uL   Hemoglobin 12.8 (L) 13.0 - 17.0 g/dL   HCT 41.1 39.0 - 52.0 %   MCV 93.2 80.0 - 100.0 fL   MCH 29.0 26.0 - 34.0 pg   MCHC 31.1 30.0 - 36.0 g/dL   RDW 12.5 11.5 - 15.5 %   Platelets 242 150 - 400 K/uL   nRBC 0.0 0.0 - 0.2 %   Neutrophils Relative % 81 %   Neutro Abs 4.6 1.7 - 7.7 K/uL   Lymphocytes Relative 10 %   Lymphs Abs 0.6 (L) 0.7 - 4.0 K/uL   Monocytes Relative 7 %   Monocytes Absolute 0.4 0.1 - 1.0 K/uL   Eosinophils Relative 2 %   Eosinophils Absolute 0.1 0.0 - 0.5 K/uL   Basophils Relative 0 %   Basophils Absolute 0.0 0.0 - 0.1 K/uL   Immature Granulocytes 0 %   Abs Immature Granulocytes 0.01 0.00 - 0.07 K/uL    Comment: Performed at [facility], [address] [place], [place] [address]  Basic metabolic panel     Status: Abnormal   Collection Time: 02/18/18  4:33 PM  Result Value Ref Range   Sodium 135 135 - 145 mmol/L   Potassium 4.1 3.5 - 5.1 mmol/L   Chloride 100 98 - 111 mmol/L   CO2 25 22 - 32 mmol/L   Glucose, Bld 113 (H) 70 - 99 mg/dL   BUN 17 8 - 23 mg/dL   Creatinine, Ser 0.68 0.61 -  1.24 mg/dL   Calcium 9.1 8.9 -  10.3 mg/dL   GFR calc non Af Amer >60 >60 mL/min   GFR calc Af Amer >60 >60 mL/min   Anion gap 10 5 - 15    Comment: Performed at [facility], [address] [place], [place] [address]  Troponin I - ONCE - STAT     Status: None   Collection Time: 02/18/18  4:33 PM  Result Value Ref Range   Troponin I <0.03 <0.03 ng/mL    Comment: Performed at [facility], [address] [place], [place] [address]    Chemistries  Recent Labs  Lab 02/18/18 1633  NA 135  K 4.1  CL 100  CO2 25  GLUCOSE 113*  BUN 17  CREATININE 0.68  CALCIUM 9.1   ------------------------------------------------------------------------------------------------------------------  ------------------------------------------------------------------------------------------------------------------ GFR: Estimated Creatinine Clearance: 91.4 mL/min (by C-G formula based on SCr of 0.68 mg/dL). Liver Function Tests: No results for input(s): AST, ALT, ALKPHOS, BILITOT, PROT, ALBUMIN in the last 168 hours. No results for input(s): LIPASE, AMYLASE in the last 168 hours. No results for input(s): AMMONIA in the last 168 hours. Coagulation Profile: No results for input(s): INR, PROTIME in the last 168 hours. Cardiac Enzymes: Recent Labs  Lab 02/18/18 1633  TROPONINI <0.03   BNP (last 3 results) No results for input(s): PROBNP in the last 8760 hours. HbA1C: No results for input(s): HGBA1C in the last 72 hours. CBG: No results for input(s): GLUCAP in the last 168 hours. Lipid Profile: No results for input(s): CHOL, HDL, LDLCALC, TRIG, CHOLHDL, LDLDIRECT in the last 72 hours. Thyroid Function Tests: No results for input(s): TSH, T4TOTAL, FREET4, T3FREE, THYROIDAB in the last 72 hours. Anemia Panel: No results for input(s): VITAMINB12, FOLATE, FERRITIN, TIBC, IRON, RETICCTPCT in the last 72 hours.  --------------------------------------------------------------------------------------------------------------- Urine  analysis:    Component Value Date/Time   COLORURINE YELLOW 04/18/2017 1111   APPEARANCEUR CLEAR 04/18/2017 1111   APPEARANCEUR Clear 11/14/2013 1034   LABSPEC 1.009 04/18/2017 1111   LABSPEC 1.003 11/14/2013 1034   PHURINE 6.0 04/18/2017 1111   GLUCOSEU NEGATIVE 04/18/2017 1111   GLUCOSEU Negative 11/14/2013 1034   HGBUR SMALL (A) 04/18/2017 1111   BILIRUBINUR NEGATIVE 04/18/2017 1111   BILIRUBINUR Negative 11/14/2013 1034   KETONESUR NEGATIVE 04/18/2017 1111   PROTEINUR NEGATIVE 04/18/2017 1111   NITRITE NEGATIVE 04/18/2017 1111   LEUKOCYTESUR NEGATIVE 04/18/2017 1111   LEUKOCYTESUR Negative 11/14/2013 1034      Imaging Results:    Dg Chest 2 View  Result Date: 02/18/2018 CLINICAL DATA:  Patient complaining of cough, tightness in chest, and fever x 4 days, sob on exertion. COPD EXAM: CHEST - 2 VIEW COMPARISON:  10/31/2017 FINDINGS: Lungs hyperinflated. No focal infiltrate. Coarse perihilar bronchovascular markings. No edema. Heart size normal.  Aortic Atherosclerosis (ICD10-170.0). Chronic blunting of posterior costophrenic angles. Visualized bones unremarkable. IMPRESSION: Hyperinflation without acute or superimposed abnormality. Electronically Signed   By: Lucrezia Europe M.D.   On: 02/18/2018 16:39   EKG-normal sinus rhythm, no ST-T changes.   Assessment & Plan:    Active Problems:   COPD exacerbation ([place])   1. Acute on chronic respiratory failure with hypoxia-secondary to COPD exacerbation, started on Solu-Medrol.  Continue Solu-Medrol 60 mg IV every 6 hours, DuoNeb nebulizer every 6 hours, Mucinex 1 tablet p.o. twice daily.  2. Headache/upper respiratory infection-continue supportive treatment, Tylenol PRN for headache.    DVT Prophylaxis-   Lovenox   AM Labs Ordered, also please review Full Orders  Family  Communication: Admission, patients condition and plan of care including tests being ordered have been discussed with the patient  who indicate understanding and  agree with the plan and Code Status.  Code Status: Full code  Admission status: Observation: Based on patients clinical presentation and evaluation of above clinical data, I have made determination that patient needs less than 2 midnight stay in the hospital.  Time spent in minutes : 60 minutes   Oswald Hillock M.D on 02/18/2018 at 9:02 PM

## 2018-02-19 DIAGNOSIS — Z7982 Long term (current) use of aspirin: Secondary | ICD-10-CM | POA: Diagnosis not present

## 2018-02-19 DIAGNOSIS — F17210 Nicotine dependence, cigarettes, uncomplicated: Secondary | ICD-10-CM | POA: Diagnosis present

## 2018-02-19 DIAGNOSIS — Z720 Tobacco use: Secondary | ICD-10-CM | POA: Diagnosis not present

## 2018-02-19 DIAGNOSIS — Z66 Do not resuscitate: Secondary | ICD-10-CM | POA: Diagnosis not present

## 2018-02-19 DIAGNOSIS — Z8673 Personal history of transient ischemic attack (TIA), and cerebral infarction without residual deficits: Secondary | ICD-10-CM | POA: Diagnosis not present

## 2018-02-19 DIAGNOSIS — Z9981 Dependence on supplemental oxygen: Secondary | ICD-10-CM | POA: Diagnosis not present

## 2018-02-19 DIAGNOSIS — Z7952 Long term (current) use of systemic steroids: Secondary | ICD-10-CM | POA: Diagnosis not present

## 2018-02-19 DIAGNOSIS — Z7189 Other specified counseling: Secondary | ICD-10-CM | POA: Diagnosis not present

## 2018-02-19 DIAGNOSIS — Z7951 Long term (current) use of inhaled steroids: Secondary | ICD-10-CM | POA: Diagnosis not present

## 2018-02-19 DIAGNOSIS — J441 Chronic obstructive pulmonary disease with (acute) exacerbation: Secondary | ICD-10-CM | POA: Diagnosis present

## 2018-02-19 DIAGNOSIS — Z8249 Family history of ischemic heart disease and other diseases of the circulatory system: Secondary | ICD-10-CM | POA: Diagnosis not present

## 2018-02-19 DIAGNOSIS — F4310 Post-traumatic stress disorder, unspecified: Secondary | ICD-10-CM | POA: Diagnosis present

## 2018-02-19 DIAGNOSIS — B341 Enterovirus infection, unspecified: Secondary | ICD-10-CM | POA: Diagnosis present

## 2018-02-19 DIAGNOSIS — J9611 Chronic respiratory failure with hypoxia: Secondary | ICD-10-CM | POA: Diagnosis present

## 2018-02-19 DIAGNOSIS — F1011 Alcohol abuse, in remission: Secondary | ICD-10-CM | POA: Diagnosis present

## 2018-02-19 DIAGNOSIS — Z8659 Personal history of other mental and behavioral disorders: Secondary | ICD-10-CM | POA: Diagnosis not present

## 2018-02-19 DIAGNOSIS — J069 Acute upper respiratory infection, unspecified: Secondary | ICD-10-CM | POA: Diagnosis present

## 2018-02-19 LAB — CBC
HCT: 40.2 % (ref 39.0–52.0)
Hemoglobin: 12.5 g/dL — ABNORMAL LOW (ref 13.0–17.0)
MCH: 28.7 pg (ref 26.0–34.0)
MCHC: 31.1 g/dL (ref 30.0–36.0)
MCV: 92.4 fL (ref 80.0–100.0)
Platelets: 243 10*3/uL (ref 150–400)
RBC: 4.35 MIL/uL (ref 4.22–5.81)
RDW: 12.4 % (ref 11.5–15.5)
WBC: 1.5 10*3/uL — ABNORMAL LOW (ref 4.0–10.5)
nRBC: 0 % (ref 0.0–0.2)

## 2018-02-19 LAB — COMPREHENSIVE METABOLIC PANEL
ALT: 15 U/L (ref 0–44)
AST: 19 U/L (ref 15–41)
Albumin: 4.1 g/dL (ref 3.5–5.0)
Alkaline Phosphatase: 40 U/L (ref 38–126)
Anion gap: 11 (ref 5–15)
BUN: 14 mg/dL (ref 8–23)
CO2: 26 mmol/L (ref 22–32)
CREATININE: 0.67 mg/dL (ref 0.61–1.24)
Calcium: 9.5 mg/dL (ref 8.9–10.3)
Chloride: 99 mmol/L (ref 98–111)
GFR calc non Af Amer: >60 mL/min (ref >60)
Glucose, Bld: 143 mg/dL — ABNORMAL HIGH (ref 70–99)
Potassium: 4.2 mmol/L (ref 3.5–5.1)
Sodium: 136 mmol/L (ref 135–145)
Total Bilirubin: 0.7 mg/dL (ref 0.3–1.2)
Total Protein: 7.3 g/dL (ref 6.5–8.1)

## 2018-02-19 LAB — INFLUENZA PANEL BY PCR (TYPE A & B)
Influenza A By PCR: NEGATIVE
Influenza B By PCR: NEGATIVE

## 2018-02-19 MED ORDER — ADULT MULTIVITAMIN W/MINERALS CH
1.0000 | ORAL_TABLET | Freq: Every day | ORAL | Status: DC
  Administered 2018-02-19 – 2018-02-25 (×7): 1 via ORAL
  Filled 2018-02-19 (×7): qty 1

## 2018-02-19 MED ORDER — LORAZEPAM 1 MG PO TABS: 1.0000 mg | ORAL_TABLET | Freq: Four times a day (QID) | ORAL | Status: DC | PRN

## 2018-02-19 MED ORDER — THIAMINE HCL 100 MG/ML IJ SOLN: 100.0000 mg | Freq: Every day | INTRAMUSCULAR | Status: DC

## 2018-02-19 MED ORDER — FOLIC ACID 1 MG PO TABS
1.0000 mg | ORAL_TABLET | Freq: Every day | ORAL | Status: DC
  Administered 2018-02-19 – 2018-02-25 (×7): 1 mg via ORAL
  Filled 2018-02-19 (×7): qty 1

## 2018-02-19 MED ORDER — IPRATROPIUM-ALBUTEROL 0.5-2.5 (3) MG/3ML IN SOLN
3.0000 mL | Freq: Four times a day (QID) | RESPIRATORY_TRACT | Status: DC | PRN
  Administered 2018-02-20 (×3): 3 mL via RESPIRATORY_TRACT
  Filled 2018-02-19 (×3): qty 3

## 2018-02-19 MED ORDER — BUDESONIDE 0.5 MG/2ML IN SUSP
0.5000 mg | Freq: Two times a day (BID) | RESPIRATORY_TRACT | Status: DC
  Administered 2018-02-19 – 2018-02-25 (×12): 0.5 mg via RESPIRATORY_TRACT
  Filled 2018-02-19 (×12): qty 2

## 2018-02-19 MED ORDER — LORAZEPAM 2 MG/ML IJ SOLN: 1.0000 mg | Freq: Four times a day (QID) | INTRAMUSCULAR | Status: DC | PRN

## 2018-02-19 MED ORDER — VITAMIN B-1 100 MG PO TABS
100.0000 mg | ORAL_TABLET | Freq: Every day | ORAL | Status: DC
  Administered 2018-02-19 – 2018-02-25 (×7): 100 mg via ORAL
  Filled 2018-02-19 (×7): qty 1

## 2018-02-19 NOTE — Care Management Note (Signed)
Case Management Note  Patient Details  Name: Robert [name] MRN: [id number] Date of Birth: Apr 19, 1955  Subjective/Objective:    Admitted with COPD. Pt from home, ind with ADL's. Pt has insurance and PCP. Pt has transportation and access to medications. Pt communicates no needs to concerns about DC planning. Pt has home oxygen pta.                Action/Plan: DC home with self care. No CM needs noted at this time.   Expected Discharge Date:     02/21/2018             Expected Discharge Plan:  [place]  In-House Referral:  NA  Discharge planning Services  CM Consult  Post Acute Care Choice:  NA Choice offered to:  NA  Status of Service:  Completed, signed off  Sherald Barge, RN 02/19/2018, 2:34 PM

## 2018-02-19 NOTE — Progress Notes (Signed)
PROGRESS NOTE  Robert [name] KKX:[id number] DOB: 06-Feb-1955 DOA: 02/18/2018 PCP: [facility]  Brief History:  63 year old male with a history of COPD, chronic respiratory failure on 3 L, anxiety, and tobacco abuse presenting with 2-day history of increasing coughing, shortness of breath, and generalized weakness. The patient denied any chest pain, nausea, vomiting, diarrhea.  Patient states that he had some fevers between 100.0 and 101.0 F.  He continues to smoke 1 to 2 cigarettes/day.  He denies any headache, neck pain, rashes, abdominal pain, hematochezia, melena.  He has a 50-pack-year history of tobacco.  The patient was started on intravenous steroids and bronchodilators for COPD.  Assessment/Plan: COPD exacerbation -Add Pulmicort -Continue duo nebs -Continue intravenous Solu-Medrol -Viral respiratory panel  Chronic respiratory failure with hypoxia -Chronically on 3 L nasal cannula -Personally reviewed chest x-ray--increased interstitial markings without consolidation -personally reviewed EKG--sinus, no STT changes  Tobacco abuse -I have discussed tobacco cessation with the patient.  I have counseled the patient regarding the negative impacts of continued tobacco use including but not limited to lung cancer, COPD, and cardiovascular disease.  I have discussed alternatives to tobacco and modalities that may help facilitate tobacco cessation including but not limited to biofeedback, hypnosis, and medications.  Total time spent with tobacco counseling was 4 minutes.  Leukopenia -likely due to viral infection -viral respiratory panel -influenza neg -am CBC with diff  alcohol abuse, in remission -CIWA      Disposition Plan:   Home in 2-3 days  Family Communication:  No Family at bedside  Consultants:  none  Code Status:  FULL   DVT Prophylaxis:  [place] Lovenox   Procedures: As Listed in Progress Note  Above  Antibiotics: None       Subjective: Patient continues to have shortness of breath with minimal exertion.  He denies any nausea, vomiting, diarrhea,, pain, chest pain, headache, neck pain.  He has a nonproductive cough.  Objective: Vitals:   02/18/18 2252 02/19/18 0105 02/19/18 0630 02/19/18 0819  BP: 114/72  (!) 167/82   Pulse: 89  (!) 101   Resp: 20  (!) 21   Temp: (!) 97.5 F (36.4 C)  (!) 97.5 F (36.4 C)   TempSrc: Oral  Oral   SpO2: 96% 97% 93% 94%  Weight: 76.9 kg     Height: 5\' 8"  (1.727 m)       Intake/Output Summary (Last 24 hours) at 02/19/2018 1696 Last data filed at 02/19/2018 0600 Gross per 24 hour  Intake 133.44 ml  Output 600 ml  Net -466.56 ml   Weight change:  Exam:   General:  Pt is alert, follows commands appropriately, not in acute distress  HEENT: No icterus, No thrush, No neck mass, Max/AT  Cardiovascular: RRR, S1/S2, no rubs, no gallops  Respiratory: Bilateral scattered rales.  Bilateral expiratory wheeze.  Good air movement.  Abdomen: Soft/+BS, non tender, non distended, no guarding  Extremities: No edema, No lymphangitis, No petechiae, No rashes, no synovitis   Data Reviewed: I have personally reviewed following labs and imaging studies Basic Metabolic Panel: Recent Labs  Lab 02/18/18 1633 02/19/18 0500  NA 135 136  K 4.1 4.2  CL 100 99  CO2 25 26  GLUCOSE 113* 143*  BUN 17 14  CREATININE 0.68 0.67  CALCIUM 9.1 9.5   Liver Function Tests: Recent Labs  Lab 02/19/18 0500  AST 19  ALT 15  ALKPHOS 40  BILITOT 0.7  PROT 7.3  ALBUMIN 4.1   No results for input(s): LIPASE, AMYLASE in the last 168 hours. No results for input(s): AMMONIA in the last 168 hours. Coagulation Profile: No results for input(s): INR, PROTIME in the last 168 hours. CBC: Recent Labs  Lab 02/18/18 1633 02/19/18 0500  WBC 5.7 1.5*  NEUTROABS 4.6  --   HGB 12.8* 12.5*  HCT 41.1 40.2  MCV 93.2 92.4  PLT 242 243   Cardiac  Enzymes: Recent Labs  Lab 02/18/18 1633  TROPONINI <0.03   BNP: Invalid input(s): POCBNP CBG: No results for input(s): GLUCAP in the last 168 hours. HbA1C: No results for input(s): HGBA1C in the last 72 hours. Urine analysis:    Component Value Date/Time   COLORURINE YELLOW 04/18/2017 1111   APPEARANCEUR CLEAR 04/18/2017 1111   APPEARANCEUR Clear 11/14/2013 1034   LABSPEC 1.009 04/18/2017 1111   LABSPEC 1.003 11/14/2013 1034   PHURINE 6.0 04/18/2017 1111   GLUCOSEU NEGATIVE 04/18/2017 1111   GLUCOSEU Negative 11/14/2013 1034   HGBUR SMALL (A) 04/18/2017 1111   BILIRUBINUR NEGATIVE 04/18/2017 1111   BILIRUBINUR Negative 11/14/2013 1034   KETONESUR NEGATIVE 04/18/2017 1111   PROTEINUR NEGATIVE 04/18/2017 1111   NITRITE NEGATIVE 04/18/2017 1111   LEUKOCYTESUR NEGATIVE 04/18/2017 1111   LEUKOCYTESUR Negative 11/14/2013 1034   Sepsis Labs: @LABRCNTIP (procalcitonin:4,lacticidven:4) )No results found for this or any previous visit (from the past 240 hour(s)).   Scheduled Meds: . aspirin EC  81 mg Oral Daily  . budesonide (PULMICORT) nebulizer solution  0.5 mg Nebulization BID  . DULoxetine  30 mg Oral BID  . enoxaparin (LOVENOX) injection  40 mg Subcutaneous Q24H  . guaiFENesin  600 mg Oral BID  . ipratropium-albuterol  3 mL Nebulization Q6H  . mouth rinse  15 mL Mouth Rinse BID  . methylPREDNISolone (SOLU-MEDROL) injection  60 mg Intravenous Q6H   Continuous Infusions: . sodium chloride 10 mL/hr at 02/18/18 2339    Procedures/Studies: Dg Chest 2 View  Result Date: 02/18/2018 CLINICAL DATA:  Patient complaining of cough, tightness in chest, and fever x 4 days, sob on exertion. COPD EXAM: CHEST - 2 VIEW COMPARISON:  10/31/2017 FINDINGS: Lungs hyperinflated. No focal infiltrate. Coarse perihilar bronchovascular markings. No edema. Heart size normal.  Aortic Atherosclerosis (ICD10-170.0). Chronic blunting of posterior costophrenic angles. Visualized bones unremarkable.  IMPRESSION: Hyperinflation without acute or superimposed abnormality. Electronically Signed   By: Lucrezia Europe M.D.   On: 02/18/2018 16:39    Orson Eva, DO  [facility] Pager [phone]  If 7PM-7AM, please contact night-coverage [email or web address] Password [facility] 02/19/2018, 9:38 AM   LOS: 0 days

## 2018-02-20 DIAGNOSIS — J9611 Chronic respiratory failure with hypoxia: Secondary | ICD-10-CM

## 2018-02-20 LAB — RESPIRATORY PANEL BY PCR
Adenovirus: NOT DETECTED
Bordetella pertussis: NOT DETECTED
Chlamydophila pneumoniae: NOT DETECTED
Coronavirus 229E: NOT DETECTED
Coronavirus HKU1: NOT DETECTED
Coronavirus NL63: NOT DETECTED
Coronavirus OC43: NOT DETECTED
Influenza A: NOT DETECTED
Influenza B: NOT DETECTED
Metapneumovirus: NOT DETECTED
Mycoplasma pneumoniae: NOT DETECTED
Parainfluenza Virus 1: NOT DETECTED
Parainfluenza Virus 2: NOT DETECTED
Parainfluenza Virus 3: NOT DETECTED
Parainfluenza Virus 4: NOT DETECTED
RESPIRATORY SYNCYTIAL VIRUS-RVPPCR: NOT DETECTED
Rhinovirus / Enterovirus: DETECTED — AB

## 2018-02-20 LAB — MAGNESIUM: Magnesium: 2.2 mg/dL (ref 1.7–2.4)

## 2018-02-20 LAB — CBC WITH DIFFERENTIAL/PLATELET
Abs Immature Granulocytes: 0.02 10*3/uL (ref 0.00–0.07)
Basophils Absolute: 0 10*3/uL (ref 0.0–0.1)
Basophils Relative: 0 %
Eosinophils Absolute: 0 10*3/uL (ref 0.0–0.5)
Eosinophils Relative: 0 %
HCT: 37.6 % — ABNORMAL LOW (ref 39.0–52.0)
Hemoglobin: 11.6 g/dL — ABNORMAL LOW (ref 13.0–17.0)
IMMATURE GRANULOCYTES: 1 %
Lymphocytes Relative: 10 %
Lymphs Abs: 0.4 10*3/uL — ABNORMAL LOW (ref 0.7–4.0)
MCH: 29 pg (ref 26.0–34.0)
MCHC: 30.9 g/dL (ref 30.0–36.0)
MCV: 94 fL (ref 80.0–100.0)
Monocytes Absolute: 0.2 10*3/uL (ref 0.1–1.0)
Monocytes Relative: 5 %
Neutro Abs: 3.8 10*3/uL (ref 1.7–7.7)
Neutrophils Relative %: 84 %
PLATELETS: 263 10*3/uL (ref 150–400)
RBC: 4 MIL/uL — ABNORMAL LOW (ref 4.22–5.81)
RDW: 12.3 % (ref 11.5–15.5)
WBC: 4.4 10*3/uL (ref 4.0–10.5)
nRBC: 0 % (ref 0.0–0.2)

## 2018-02-20 LAB — BASIC METABOLIC PANEL
ANION GAP: 8 (ref 5–15)
BUN: 16 mg/dL (ref 8–23)
CO2: 30 mmol/L (ref 22–32)
Calcium: 9.2 mg/dL (ref 8.9–10.3)
Chloride: 97 mmol/L — ABNORMAL LOW (ref 98–111)
Creatinine, Ser: 0.73 mg/dL (ref 0.61–1.24)
GFR calc Af Amer: >60 mL/min (ref >60)
Glucose, Bld: 138 mg/dL — ABNORMAL HIGH (ref 70–99)
POTASSIUM: 4.5 mmol/L (ref 3.5–5.1)
Sodium: 135 mmol/L (ref 135–145)

## 2018-02-20 MED ORDER — IPRATROPIUM-ALBUTEROL 0.5-2.5 (3) MG/3ML IN SOLN
3.0000 mL | RESPIRATORY_TRACT | Status: DC | PRN
  Administered 2018-02-20 – 2018-02-24 (×6): 3 mL via RESPIRATORY_TRACT
  Filled 2018-02-20 (×4): qty 3

## 2018-02-20 MED ORDER — IPRATROPIUM-ALBUTEROL 0.5-2.5 (3) MG/3ML IN SOLN
3.0000 mL | Freq: Four times a day (QID) | RESPIRATORY_TRACT | Status: DC
  Administered 2018-02-20 – 2018-02-25 (×15): 3 mL via RESPIRATORY_TRACT
  Filled 2018-02-20 (×16): qty 3

## 2018-02-20 MED ORDER — ARFORMOTEROL TARTRATE 15 MCG/2ML IN NEBU
15.0000 ug | INHALATION_SOLUTION | Freq: Two times a day (BID) | RESPIRATORY_TRACT | Status: DC
  Administered 2018-02-20 – 2018-02-25 (×9): 15 ug via RESPIRATORY_TRACT
  Filled 2018-02-20 (×9): qty 2

## 2018-02-20 NOTE — Progress Notes (Signed)
PROGRESS NOTE  Robert [name] PTW:[id number] DOB: 04-08-1955 DOA: 02/18/2018 PCP: [facility]  Brief History:  63 year old male with a history of COPD, chronic respiratory failure on 3 L, anxiety, and tobacco abuse presenting with 2-day history of increasing coughing, shortness of breath, and generalized weakness. The patient denied any chest pain, nausea, vomiting, diarrhea.  Patient states that he had some fevers between 100.0 and 101.0 F.  He continues to smoke 1 to 2 cigarettes/day.  He denies any headache, neck pain, rashes, abdominal pain, hematochezia, melena.  He has a 50-pack-year history of tobacco.  The patient was started on intravenous steroids and bronchodilators for COPD.  Assessment/Plan: COPD exacerbation -Continue Pulmicort -add [place] -Continue duo nebs -Continue intravenous Solu-Medrol -Viral respiratory panel--+rhino/enterovirus -continues to have dyspnea with minimal exertion and wheezing  Chronic respiratory failure with hypoxia -Chronically on 3 L nasal cannula -Personally reviewed chest x-ray--increased interstitial markings without consolidation -personally reviewed EKG--sinus, no STT changes  Tobacco abuse -I have discussed tobacco cessation with the patient.  I have counseled the patient regarding the negative impacts of continued tobacco use including but not limited to lung cancer, COPD, and cardiovascular disease.  I have discussed alternatives to tobacco and modalities that may help facilitate tobacco cessation including but not limited to biofeedback, hypnosis, and medications.  Total time spent with tobacco counseling was 4 minutes.  Leukopenia -likely due to viral infection -viral respiratory panel--+rhino/enterovirus -influenza neg -am CBC with diff  alcohol abuse, in remission -CIWA      Disposition Plan:   Home in 2-3 days  Family Communication:  No Family at bedside  Consultants:   none  Code Status:  FULL   DVT Prophylaxis:   Lovenox   Procedures: As Listed in Progress Note Above  Antibiotics: None    Subjective: Pt feels that his breathing is only slightly better than yesterday.  He continues to have nonproductive cough.  Denies hemoptysis.  C/o dyspnea on exertion.  No n/v/d, f/c headache, chest pain  Objective: Vitals:   02/20/18 0239 02/20/18 0642 02/20/18 1154 02/20/18 1334  BP:    135/79  Pulse:    93  Resp:    19  Temp:    97.9 F (36.6 C)  TempSrc:    Oral  SpO2: 98% 98% 95% 94%  Weight:      Height:        Intake/Output Summary (Last 24 hours) at 02/20/2018 1712 Last data filed at 02/20/2018 0730 Gross per 24 hour  Intake 240 ml  Output 750 ml  Net -510 ml   Weight change:  Exam:   General:  Pt is alert, follows commands appropriately, not in acute distress  HEENT: No icterus, No thrush, No neck mass, [place]/AT  Cardiovascular: RRR, S1/S2, no rubs, no gallops  Respiratory: bilateral exp wheeze.  Bilateral rales  Abdomen: Soft/+BS, non tender, non distended, no guarding  Extremities: No edema, No lymphangitis, No petechiae, No rashes, no synovitis   Data Reviewed: I have personally reviewed following labs and imaging studies Basic Metabolic Panel: Recent Labs  Lab 02/18/18 1633 02/19/18 0500 02/20/18 0534  NA 135 136 135  K 4.1 4.2 4.5  CL 100 99 97*  CO2 25 26 30   GLUCOSE 113* 143* 138*  BUN 17 14 16   CREATININE 0.68 0.67 0.73  CALCIUM 9.1 9.5 9.2  MG  --   --  2.2   Liver Function Tests: Recent  Labs  Lab 02/19/18 0500  AST 19  ALT 15  ALKPHOS 40  BILITOT 0.7  PROT 7.3  ALBUMIN 4.1   No results for input(s): LIPASE, AMYLASE in the last 168 hours. No results for input(s): AMMONIA in the last 168 hours. Coagulation Profile: No results for input(s): INR, PROTIME in the last 168 hours. CBC: Recent Labs  Lab 02/18/18 1633 02/19/18 0500 02/20/18 0534  WBC 5.7 1.5* 4.4  NEUTROABS 4.6  --  3.8    HGB 12.8* 12.5* 11.6*  HCT 41.1 40.2 37.6*  MCV 93.2 92.4 94.0  PLT 242 243 263   Cardiac Enzymes: Recent Labs  Lab 02/18/18 1633  TROPONINI <0.03   BNP: Invalid input(s): POCBNP CBG: No results for input(s): GLUCAP in the last 168 hours. HbA1C: No results for input(s): HGBA1C in the last 72 hours. Urine analysis:    Component Value Date/Time   COLORURINE YELLOW 04/18/2017 1111   APPEARANCEUR CLEAR 04/18/2017 1111   APPEARANCEUR Clear 11/14/2013 1034   LABSPEC 1.009 04/18/2017 1111   LABSPEC 1.003 11/14/2013 1034   PHURINE 6.0 04/18/2017 1111   GLUCOSEU NEGATIVE 04/18/2017 1111   GLUCOSEU Negative 11/14/2013 1034   HGBUR SMALL (A) 04/18/2017 1111   BILIRUBINUR NEGATIVE 04/18/2017 1111   BILIRUBINUR Negative 11/14/2013 [place] 04/18/2017 1111   PROTEINUR NEGATIVE 04/18/2017 1111   NITRITE NEGATIVE 04/18/2017 1111   LEUKOCYTESUR NEGATIVE 04/18/2017 1111   LEUKOCYTESUR Negative 11/14/2013 1034   Sepsis Labs: @LABRCNTIP (procalcitonin:4,lacticidven:4) ) Recent Results (from the past 240 hour(s))  Respiratory Panel by PCR     Status: Abnormal   Collection Time: 02/19/18  5:58 PM  Result Value Ref Range Status   Adenovirus NOT DETECTED NOT DETECTED Final   Coronavirus 229E NOT DETECTED NOT DETECTED Final    Comment: (NOTE) The Coronavirus on the Respiratory Panel, DOES NOT test for the novel  Coronavirus (2019 nCoV)    Coronavirus HKU1 NOT DETECTED NOT DETECTED Final   Coronavirus NL63 NOT DETECTED NOT DETECTED Final   Coronavirus OC43 NOT DETECTED NOT DETECTED Final   Metapneumovirus NOT DETECTED NOT DETECTED Final   Rhinovirus / Enterovirus DETECTED (A) NOT DETECTED Final   Influenza A NOT DETECTED NOT DETECTED Final   Influenza B NOT DETECTED NOT DETECTED Final   Parainfluenza Virus 1 NOT DETECTED NOT DETECTED Final   Parainfluenza Virus 2 NOT DETECTED NOT DETECTED Final   Parainfluenza Virus 3 NOT DETECTED NOT DETECTED Final   Parainfluenza  Virus 4 NOT DETECTED NOT DETECTED Final   Respiratory Syncytial Virus NOT DETECTED NOT DETECTED Final   Bordetella pertussis NOT DETECTED NOT DETECTED Final   Chlamydophila pneumoniae NOT DETECTED NOT DETECTED Final   Mycoplasma pneumoniae NOT DETECTED NOT DETECTED Final    Comment: Performed at [facility], [place] [address] [place], [place] [address]     Scheduled Meds: . arformoterol  15 mcg Nebulization BID  . aspirin EC  81 mg Oral Daily  . budesonide (PULMICORT) nebulizer solution  0.5 mg Nebulization BID  . DULoxetine  30 mg Oral BID  . enoxaparin (LOVENOX) injection  40 mg Subcutaneous Q24H  . folic acid  1 mg Oral Daily  . guaiFENesin  600 mg Oral BID  . ipratropium-albuterol  3 mL Nebulization Q6H  . mouth rinse  15 mL Mouth Rinse BID  . methylPREDNISolone (SOLU-MEDROL) injection  60 mg Intravenous Q6H  . multivitamin with minerals  1 tablet Oral Daily  . thiamine  100 mg Oral Daily  Or  . thiamine  100 mg Intravenous Daily   Continuous Infusions: . sodium chloride 10 mL/hr at 02/18/18 2339    Procedures/Studies: Dg Chest 2 View  Result Date: 02/18/2018 CLINICAL DATA:  Patient complaining of cough, tightness in chest, and fever x 4 days, sob on exertion. COPD EXAM: CHEST - 2 VIEW COMPARISON:  10/31/2017 FINDINGS: Lungs hyperinflated. No focal infiltrate. Coarse perihilar bronchovascular markings. No edema. Heart size normal.  Aortic Atherosclerosis (ICD10-170.0). Chronic blunting of posterior costophrenic angles. Visualized bones unremarkable. IMPRESSION: Hyperinflation without acute or superimposed abnormality. Electronically Signed   By: Lucrezia Europe M.D.   On: 02/18/2018 16:39    Orson Eva, DO  [facility] Pager [phone]  If 7PM-7AM, please contact night-coverage [email or web address] Password [email or web address] 02/20/2018, 5:12 PM   LOS: 1 day

## 2018-02-21 NOTE — Care Management Important Message (Signed)
Important Message  Patient Details  Name: Robert [name] MRN: [id number] Date of Birth: 12-Jan-1955   Medicare Important Message Given:  Yes    Tommy Medal 02/21/2018, 11:07 AM

## 2018-02-21 NOTE — Progress Notes (Signed)
PROGRESS NOTE  Robert [name] HYI:[id number] DOB: November 23, 1955 DOA: 02/18/2018 PCP: [facility]   Brief History: 63 year old male with a history of COPD, chronic respiratory failure on 3 L, anxiety, and tobacco abuse presenting with2-day history of increasing coughing, shortness of breath, and generalized weakness. The patient denied any chest pain, nausea, vomiting, diarrhea. Patient states that he had some fevers between 100.0 and 101.0 F. He continues to smoke 1 to 2 cigarettes/day. He denies any headache, neck pain, rashes, abdominal pain, hematochezia, melena. He has a 50-pack-year history of tobacco. The patient was started on intravenous steroids and bronchodilators for COPD.  Assessment/Plan: COPD exacerbation -Continue Pulmicort -continue Brovana -Continue duo nebs -Continue intravenous Solu-Medrol -Viral respiratory panel--+rhino/enterovirus -continues to have dyspnea with minimal exertion and wheezing  Chronic respiratory failure with hypoxia -Chronically on 3 L nasal cannula -Personally reviewed chest x-ray--increased interstitial markings without consolidation -personally reviewed EKG--sinus, no STT changes  Tobacco abuse -I have discussed tobacco cessation with the patient. I have counseled the patient regarding the negative impacts of continued tobacco use including but not limited to lung cancer, COPD, and cardiovascular disease. I have discussed alternatives to tobacco and modalities that may help facilitate tobacco cessation including but not limited to biofeedback, hypnosis, and medications. Total time spent with tobacco counseling was 4 minutes.  Leukopenia -likely due to viral infection -viral respiratory panel--+rhino/enterovirus -influenza neg -am CBC with diff  alcohol abuse, in remission -CIWA      Disposition Plan: Home in 2-3days  Family Communication:NoFamily at  bedside  Consultants:none  Code Status: FULL   DVT Prophylaxis: [place] Lovenox   Procedures: As Listed in Progress Note Above  Antibiotics: None      Subjective: Patient states that he is breathing slightly better but still remains dyspneic with mild exertion.  He still has nonproductive cough.  He denies any headache, chest pain, nausea, vomiting, diarrhea, abdominal pain, dysuria, hematuria, fever, chills, sore throat.  Objective: Vitals:   02/21/18 0736 02/21/18 1124 02/21/18 1200 02/21/18 1355  BP:   (!) 149/73   Pulse:   87   Resp:   18   Temp:   98.1 F (36.7 C)   TempSrc:   Oral   SpO2: 92% 96% 95% 96%  Weight:      Height:        Intake/Output Summary (Last 24 hours) at 02/21/2018 1815 Last data filed at 02/21/2018 1700 Gross per 24 hour  Intake 960 ml  Output 1250 ml  Net -290 ml   Weight change:  Exam:   General:  Pt is alert, follows commands appropriately, not in acute distress  HEENT: No icterus, No thrush, No neck mass, [place]/AT  Cardiovascular: RRR, S1/S2, no rubs, no gallops  Respiratory: Bilateral scattered rales.  Bilateral scattered expiratory wheeze.  Diminished breath sounds bilateral.  Abdomen: Soft/+BS, non tender, non distended, no guarding  Extremities: No edema, No lymphangitis, No petechiae, No rashes, no synovitis   Data Reviewed: I have personally reviewed following labs and imaging studies Basic Metabolic Panel: Recent Labs  Lab 02/18/18 1633 02/19/18 0500 02/20/18 0534  NA 135 136 135  K 4.1 4.2 4.5  CL 100 99 97*  CO2 25 26 30   GLUCOSE 113* 143* 138*  BUN 17 14 16   CREATININE 0.68 0.67 0.73  CALCIUM 9.1 9.5 9.2  MG  --   --  2.2   Liver Function Tests: Recent Labs  Lab  02/19/18 0500  AST 19  ALT 15  ALKPHOS 40  BILITOT 0.7  PROT 7.3  ALBUMIN 4.1   No results for input(s): LIPASE, AMYLASE in the last 168 hours. No results for input(s): AMMONIA in the last 168 hours. Coagulation Profile: No  results for input(s): INR, PROTIME in the last 168 hours. CBC: Recent Labs  Lab 02/18/18 1633 02/19/18 0500 02/20/18 0534  WBC 5.7 1.5* 4.4  NEUTROABS 4.6  --  3.8  HGB 12.8* 12.5* 11.6*  HCT 41.1 40.2 37.6*  MCV 93.2 92.4 94.0  PLT 242 243 263   Cardiac Enzymes: Recent Labs  Lab 02/18/18 1633  TROPONINI <0.03   BNP: Invalid input(s): POCBNP CBG: No results for input(s): GLUCAP in the last 168 hours. HbA1C: No results for input(s): HGBA1C in the last 72 hours. Urine analysis:    Component Value Date/Time   COLORURINE YELLOW 04/18/2017 1111   APPEARANCEUR CLEAR 04/18/2017 1111   APPEARANCEUR Clear 11/14/2013 1034   LABSPEC 1.009 04/18/2017 1111   LABSPEC 1.003 11/14/2013 1034   PHURINE 6.0 04/18/2017 1111   GLUCOSEU NEGATIVE 04/18/2017 1111   GLUCOSEU Negative 11/14/2013 1034   HGBUR SMALL (A) 04/18/2017 1111   BILIRUBINUR NEGATIVE 04/18/2017 1111   BILIRUBINUR Negative 11/14/2013 [place] 04/18/2017 1111   PROTEINUR NEGATIVE 04/18/2017 1111   NITRITE NEGATIVE 04/18/2017 1111   LEUKOCYTESUR NEGATIVE 04/18/2017 1111   LEUKOCYTESUR Negative 11/14/2013 1034   Sepsis Labs: @LABRCNTIP (procalcitonin:4,lacticidven:4) ) Recent Results (from the past 240 hour(s))  Respiratory Panel by PCR     Status: Abnormal   Collection Time: 02/19/18  5:58 PM  Result Value Ref Range Status   Adenovirus NOT DETECTED NOT DETECTED Final   Coronavirus 229E NOT DETECTED NOT DETECTED Final    Comment: (NOTE) The Coronavirus on the Respiratory Panel, DOES NOT test for the novel  Coronavirus (2019 nCoV)    Coronavirus HKU1 NOT DETECTED NOT DETECTED Final   Coronavirus NL63 NOT DETECTED NOT DETECTED Final   Coronavirus OC43 NOT DETECTED NOT DETECTED Final   Metapneumovirus NOT DETECTED NOT DETECTED Final   Rhinovirus / Enterovirus DETECTED (A) NOT DETECTED Final   Influenza A NOT DETECTED NOT DETECTED Final   Influenza B NOT DETECTED NOT DETECTED Final    Parainfluenza Virus 1 NOT DETECTED NOT DETECTED Final   Parainfluenza Virus 2 NOT DETECTED NOT DETECTED Final   Parainfluenza Virus 3 NOT DETECTED NOT DETECTED Final   Parainfluenza Virus 4 NOT DETECTED NOT DETECTED Final   Respiratory Syncytial Virus NOT DETECTED NOT DETECTED Final   Bordetella pertussis NOT DETECTED NOT DETECTED Final   Chlamydophila pneumoniae NOT DETECTED NOT DETECTED Final   Mycoplasma pneumoniae NOT DETECTED NOT DETECTED Final    Comment: Performed at [facility], [place] [address] [place], [place] [address]     Scheduled Meds: . arformoterol  15 mcg Nebulization BID  . aspirin EC  81 mg Oral Daily  . budesonide (PULMICORT) nebulizer solution  0.5 mg Nebulization BID  . DULoxetine  30 mg Oral BID  . enoxaparin (LOVENOX) injection  40 mg Subcutaneous Q24H  . folic acid  1 mg Oral Daily  . guaiFENesin  600 mg Oral BID  . ipratropium-albuterol  3 mL Nebulization Q6H  . mouth rinse  15 mL Mouth Rinse BID  . methylPREDNISolone (SOLU-MEDROL) injection  60 mg Intravenous Q6H  . multivitamin with minerals  1 tablet Oral Daily  . thiamine  100 mg Oral Daily   Or  .  thiamine  100 mg Intravenous Daily   Continuous Infusions: . sodium chloride 10 mL/hr at 02/18/18 2339    Procedures/Studies: Dg Chest 2 View  Result Date: 02/18/2018 CLINICAL DATA:  Patient complaining of cough, tightness in chest, and fever x 4 days, sob on exertion. COPD EXAM: CHEST - 2 VIEW COMPARISON:  10/31/2017 FINDINGS: Lungs hyperinflated. No focal infiltrate. Coarse perihilar bronchovascular markings. No edema. Heart size normal.  Aortic Atherosclerosis (ICD10-170.0). Chronic blunting of posterior costophrenic angles. Visualized bones unremarkable. IMPRESSION: Hyperinflation without acute or superimposed abnormality. Electronically Signed   By: Lucrezia Europe M.D.   On: 02/18/2018 16:39    Orson Eva, DO  [facility] Pager [phone]  If 7PM-7AM, please contact  night-coverage [email or web address] Password [email or web address] 02/21/2018, 6:15 PM   LOS: 2 days

## 2018-02-22 ENCOUNTER — Encounter (HOSPITAL_COMMUNITY): Payer: Self-pay

## 2018-02-22 ENCOUNTER — Inpatient Hospital Stay (HOSPITAL_COMMUNITY): Payer: Medicare Other

## 2018-02-22 DIAGNOSIS — Z7189 Other specified counseling: Secondary | ICD-10-CM

## 2018-02-22 MED ORDER — AZITHROMYCIN 250 MG PO TABS
500.0000 mg | ORAL_TABLET | Freq: Every day | ORAL | Status: DC
  Administered 2018-02-22 – 2018-02-25 (×4): 500 mg via ORAL
  Filled 2018-02-22 (×5): qty 2

## 2018-02-22 MED ORDER — IOHEXOL 300 MG/ML  SOLN
75.0000 mL | Freq: Once | INTRAMUSCULAR | Status: AC | PRN
  Administered 2018-02-22: 75 mL via INTRAVENOUS

## 2018-02-22 MED ORDER — LORAZEPAM 2 MG/ML IJ SOLN
0.5000 mg | INTRAMUSCULAR | Status: DC | PRN
  Administered 2018-02-22: 0.5 mg via INTRAVENOUS
  Filled 2018-02-22: qty 1

## 2018-02-22 NOTE — Progress Notes (Signed)
PROGRESS NOTE  Robert [name] WCB:[id number] DOB: 02/06/1955 DOA: 02/18/2018 PCP: [facility]  Brief History: 64 year old male with a history of COPD, chronic respiratory failure on 3 L, anxiety, and tobacco abuse presenting with2-day history of increasing coughing, shortness of breath, and generalized weakness. The patient denied any chest pain, nausea, vomiting, diarrhea. Patient states that he had some fevers between 100.0 and 101.0 F. He continues to smoke 1 to 2 cigarettes/day. He denies any headache, neck pain, rashes, abdominal pain, hematochezia, melena. He has a 50-pack-year history of tobacco. The patient was started on intravenous steroids and bronchodilators for COPD.  Unfortunately, the patient was very slow to improve.  Pulmonary medicine was consulted to assist.  Assessment/Plan: COPD exacerbation -ContinuePulmicort -continue Brovana -Continue duo nebs -Continue intravenous Solu-Medrol -Viral respiratory panel--+rhino/enterovirus -continues to have dyspnea with minimal exertion and wheezing -consult pulmonary -add azithromycin -CT chest -add lorazepam prn anxiety, sob -add flutter valve  Chronic respiratory failure with hypoxia -Chronically on 3 L nasal cannula -Personally reviewed chest x-ray--increased interstitial markings without consolidation -personally reviewed EKG--sinus, no STT changes  Tobacco abuse -I have discussed tobacco cessation with the patient. I have counseled the patient regarding the negative impacts of continued tobacco use including but not limited to lung cancer, COPD, and cardiovascular disease. I have discussed alternatives to tobacco and modalities that may help facilitate tobacco cessation including but not limited to biofeedback, hypnosis, and medications. Total time spent with tobacco counseling was 4 minutes.  Leukopenia -likely due to viral infection -viral respiratory  panel--+rhino/enterovirus -influenza neg -am CBC with diff  alcohol abuse, in remission -CIWA -no signs of withdrawal  GOC -long discussion with patient -pt changed to DNR -add ativan prn anxiety, sob Advance care planning, including the explanation and discussion of advance directives was carried out with the patient and family.  Code status including explanations of "Full Code" and "DNR" and alternatives were discussed in detail.  Discussion of end-of-life issues including but not limited palliative care, hospice care and the concept of hospice, other end-of-life care options, power of attorney for health care decisions, living wills, and physician orders for life-sustaining treatment were also discussed with the patient and family.  Total face to face time 16 minutes.       Disposition Plan: Home in 2-3days  Family Communication:NoFamily at bedside  Consultants:none  Code Status: FULL   DVT Prophylaxis:  Lovenox   Procedures: As Listed in Progress Note Above  Antibiotics: azithro 2/15>>>   Total time spent 35 minutes.  Greater than 50% spent face to face counseling and coordinating care.    Subjective: Pt states he becomes sob with minimal exertion.  Denies f/c, cp, n/v/d, abd pain.  No diarrhea, hematochezia, dysuria.  Still has nonproductive cough  Objective: Vitals:   02/22/18 0751 02/22/18 1115 02/22/18 1133 02/22/18 1404  BP:   (!) 155/93   Pulse:   93   Resp:   20   Temp:   98 F (36.7 C)   TempSrc:   Oral   SpO2: 96% 97% 96% 98%  Weight:      Height:        Intake/Output Summary (Last 24 hours) at 02/22/2018 1645 Last data filed at 02/22/2018 1311 Gross per 24 hour  Intake 720 ml  Output 3050 ml  Net -2330 ml   Weight change:  Exam:   General:  Pt is alert, follows commands appropriately, not in  acute distress  HEENT: No icterus, No thrush, No neck mass, [place]/AT  Cardiovascular: RRR, S1/S2, no rubs, no  gallops  Respiratory: CTA bilaterally, no wheezing, no crackles, no rhonchi  Abdomen: Soft/+BS, non tender, non distended, no guarding  Extremities: No edema, No lymphangitis, No petechiae, No rashes, no synovitis   Data Reviewed: I have personally reviewed following labs and imaging studies Basic Metabolic Panel: Recent Labs  Lab 02/18/18 1633 02/19/18 0500 02/20/18 0534  NA 135 136 135  K 4.1 4.2 4.5  CL 100 99 97*  CO2 25 26 30   GLUCOSE 113* 143* 138*  BUN 17 14 16   CREATININE 0.68 0.67 0.73  CALCIUM 9.1 9.5 9.2  MG  --   --  2.2   Liver Function Tests: Recent Labs  Lab 02/19/18 0500  AST 19  ALT 15  ALKPHOS 40  BILITOT 0.7  PROT 7.3  ALBUMIN 4.1   No results for input(s): LIPASE, AMYLASE in the last 168 hours. No results for input(s): AMMONIA in the last 168 hours. Coagulation Profile: No results for input(s): INR, PROTIME in the last 168 hours. CBC: Recent Labs  Lab 02/18/18 1633 02/19/18 0500 02/20/18 0534  WBC 5.7 1.5* 4.4  NEUTROABS 4.6  --  3.8  HGB 12.8* 12.5* 11.6*  HCT 41.1 40.2 37.6*  MCV 93.2 92.4 94.0  PLT 242 243 263   Cardiac Enzymes: Recent Labs  Lab 02/18/18 1633  TROPONINI <0.03   BNP: Invalid input(s): POCBNP CBG: No results for input(s): GLUCAP in the last 168 hours. HbA1C: No results for input(s): HGBA1C in the last 72 hours. Urine analysis:    Component Value Date/Time   COLORURINE YELLOW 04/18/2017 1111   APPEARANCEUR CLEAR 04/18/2017 1111   APPEARANCEUR Clear 11/14/2013 1034   LABSPEC 1.009 04/18/2017 1111   LABSPEC 1.003 11/14/2013 1034   PHURINE 6.0 04/18/2017 1111   GLUCOSEU NEGATIVE 04/18/2017 1111   GLUCOSEU Negative 11/14/2013 1034   HGBUR SMALL (A) 04/18/2017 1111   BILIRUBINUR NEGATIVE 04/18/2017 1111   BILIRUBINUR Negative 11/14/2013 [employer] 04/18/2017 1111   PROTEINUR NEGATIVE 04/18/2017 1111   NITRITE NEGATIVE 04/18/2017 1111   LEUKOCYTESUR NEGATIVE 04/18/2017 1111    LEUKOCYTESUR Negative 11/14/2013 1034   Sepsis Labs: @LABRCNTIP (procalcitonin:4,lacticidven:4) ) Recent Results (from the past 240 hour(s))  Respiratory Panel by PCR     Status: Abnormal   Collection Time: 02/19/18  5:58 PM  Result Value Ref Range Status   Adenovirus NOT DETECTED NOT DETECTED Final   Coronavirus 229E NOT DETECTED NOT DETECTED Final    Comment: (NOTE) The Coronavirus on the Respiratory Panel, DOES NOT test for the novel  Coronavirus (2019 nCoV)    Coronavirus HKU1 NOT DETECTED NOT DETECTED Final   Coronavirus NL63 NOT DETECTED NOT DETECTED Final   Coronavirus OC43 NOT DETECTED NOT DETECTED Final   Metapneumovirus NOT DETECTED NOT DETECTED Final   Rhinovirus / Enterovirus DETECTED (A) NOT DETECTED Final   Influenza A NOT DETECTED NOT DETECTED Final   Influenza B NOT DETECTED NOT DETECTED Final   Parainfluenza Virus 1 NOT DETECTED NOT DETECTED Final   Parainfluenza Virus 2 NOT DETECTED NOT DETECTED Final   Parainfluenza Virus 3 NOT DETECTED NOT DETECTED Final   Parainfluenza Virus 4 NOT DETECTED NOT DETECTED Final   Respiratory Syncytial Virus NOT DETECTED NOT DETECTED Final   Bordetella pertussis NOT DETECTED NOT DETECTED Final   Chlamydophila pneumoniae NOT DETECTED NOT DETECTED Final   Mycoplasma pneumoniae NOT DETECTED NOT DETECTED Final  Comment: Performed at [facility], [place] [address] [place], [place] [address]     Scheduled Meds: . arformoterol  15 mcg Nebulization BID  . aspirin EC  81 mg Oral Daily  . budesonide (PULMICORT) nebulizer solution  0.5 mg Nebulization BID  . DULoxetine  30 mg Oral BID  . enoxaparin (LOVENOX) injection  40 mg Subcutaneous Q24H  . folic acid  1 mg Oral Daily  . guaiFENesin  600 mg Oral BID  . ipratropium-albuterol  3 mL Nebulization Q6H  . mouth rinse  15 mL Mouth Rinse BID  . methylPREDNISolone (SOLU-MEDROL) injection  60 mg Intravenous Q6H  . multivitamin with minerals  1 tablet Oral Daily  . thiamine  100 mg  Oral Daily   Or  . thiamine  100 mg Intravenous Daily   Continuous Infusions: . sodium chloride 10 mL/hr at 02/18/18 2339    Procedures/Studies: Dg Chest 2 View  Result Date: 02/18/2018 CLINICAL DATA:  Patient complaining of cough, tightness in chest, and fever x 4 days, sob on exertion. COPD EXAM: CHEST - 2 VIEW COMPARISON:  10/31/2017 FINDINGS: Lungs hyperinflated. No focal infiltrate. Coarse perihilar bronchovascular markings. No edema. Heart size normal.  Aortic Atherosclerosis (ICD10-170.0). Chronic blunting of posterior costophrenic angles. Visualized bones unremarkable. IMPRESSION: Hyperinflation without acute or superimposed abnormality. Electronically Signed   By: Lucrezia Europe M.D.   On: 02/18/2018 16:39    Orson Eva, DO  [facility] Pager [phone]  If 7PM-7AM, please contact night-coverage [email or web address] Password [email or web address] 02/22/2018, 4:45 PM   LOS: 3 days

## 2018-02-23 LAB — BASIC METABOLIC PANEL
Anion gap: 9 (ref 5–15)
BUN: 19 mg/dL (ref 8–23)
CO2: 34 mmol/L — ABNORMAL HIGH (ref 22–32)
Calcium: 8.9 mg/dL (ref 8.9–10.3)
Chloride: 91 mmol/L — ABNORMAL LOW (ref 98–111)
Creatinine, Ser: 0.65 mg/dL (ref 0.61–1.24)
GFR calc Af Amer: >60 mL/min (ref >60)
GFR calc non Af Amer: >60 mL/min (ref >60)
Glucose, Bld: 120 mg/dL — ABNORMAL HIGH (ref 70–99)
POTASSIUM: 4.3 mmol/L (ref 3.5–5.1)
Sodium: 134 mmol/L — ABNORMAL LOW (ref 135–145)

## 2018-02-23 LAB — GLUCOSE, CAPILLARY: Glucose-Capillary: 121 mg/dL — ABNORMAL HIGH (ref 70–99)

## 2018-02-23 LAB — PHOSPHORUS: PHOSPHORUS: 4.1 mg/dL (ref 2.5–4.6)

## 2018-02-23 LAB — MAGNESIUM: Magnesium: 2.3 mg/dL (ref 1.7–2.4)

## 2018-02-23 MED ORDER — ACETYLCYSTEINE 20 % IN SOLN
3.0000 mL | Freq: Four times a day (QID) | RESPIRATORY_TRACT | Status: DC
  Administered 2018-02-23 – 2018-02-25 (×8): 3 mL via RESPIRATORY_TRACT
  Filled 2018-02-23 (×7): qty 4

## 2018-02-23 NOTE — Progress Notes (Signed)
PROGRESS NOTE  Robert [name] NOB:[id number] DOB: 1955-07-30 DOA: 02/18/2018 PCP: [facility]  Brief History: 63 year old male with a history of COPD, chronic respiratory failure on 3 L, anxiety, and tobacco abuse presenting with2-day history of increasing coughing, shortness of breath, and generalized weakness. The patient denied any chest pain, nausea, vomiting, diarrhea. Patient states that he had some fevers between 100.0 and 101.0 F. He continues to smoke 1 to 2 cigarettes/day. He denies any headache, neck pain, rashes, abdominal pain, hematochezia, melena. He has a 50-pack-year history of tobacco. The patient was started on intravenous steroids and bronchodilators for COPD.  Unfortunately, the patient was very slow to improve.  Pulmonary medicine was consulted to assist.  Assessment/Plan: COPD exacerbation -ContinuePulmicort -continueBrovana -Continue duo nebs -Continue intravenous Solu-Medrol -Viral respiratory panel--+rhino/enterovirus -continues to have dyspnea with minimal exertion and wheezing -consult pulmonary-->added mucomyst -add azithromycin -CT chest--centrilobular emphysema, no consolidation, no bronchiectasis -added lorazepam prn anxiety, sob -added flutter valve  Chronic respiratory failure with hypoxia -Chronically on 3 L nasal cannula -Personally reviewed chest x-ray--increased interstitial markings without consolidation -personally reviewed EKG--sinus, no STT changes  Tobacco abuse -I have discussed tobacco cessation with the patient. I have counseled the patient regarding the negative impacts of continued tobacco use including but not limited to lung cancer, COPD, and cardiovascular disease. I have discussed alternatives to tobacco and modalities that may help facilitate tobacco cessation including but not limited to biofeedback, hypnosis, and medications. Total time spent with tobacco counseling was 4  minutes.  Leukopenia -likely due to viral infection -viral respiratory panel--+rhino/enterovirus -influenza neg -am CBC with diff  alcohol abuse, in remission -CIWA -no signs of withdrawal  GOC -long discussion with patient -pt changed to DNR -add ativan prn anxiety, sob Advance care planning, including the explanation and discussion of advance directives was carried out with the patient and family.  Code status including explanations of "Full Code" and "DNR" and alternatives were discussed in detail.  Discussion of end-of-life issues including but not limited palliative care, hospice care and the concept of hospice, other end-of-life care options, power of attorney for health care decisions, living wills, and physician orders for life-sustaining treatment were also discussed with the patient and family.  Total face to face time 16 minutes.       Disposition Plan: Home in 2-3days  Family Communication:NoFamily at bedside  Consultants:none  Code Status: FULL   DVT Prophylaxis: [place] Lovenox   Procedures: As Listed in Progress Note Above  Antibiotics: azithro 2/15>>>        Subjective: Pt feels breathing is slightly better today, but still has dyspnea with mild exertion to BR.  Nonproductive cough a little better.  No hemoptysis.  Denies f/c, cp, n/v/d, abd pain, dysuria  Objective: Vitals:   02/23/18 0749 02/23/18 0754 02/23/18 1057 02/23/18 1441  BP:      Pulse:      Resp:      Temp:      TempSrc:      SpO2: 98% 98% 95% 97%  Weight:      Height:        Intake/Output Summary (Last 24 hours) at 02/23/2018 1707 Last data filed at 02/23/2018 1300 Gross per 24 hour  Intake 480 ml  Output 550 ml  Net -70 ml   Weight change:  Exam:   General:  Pt is alert, follows commands appropriately, not in acute distress  HEENT: No icterus,  No thrush, No neck mass, [place]/AT  Cardiovascular: RRR, S1/S2, no rubs, no gallops  Respiratory:  bilateral rales. Bilateral exp wheeze.  Diminished breath sounds  Abdomen: Soft/+BS, non tender, non distended, no guarding  Extremities: No edema, No lymphangitis, No petechiae, No rashes, no synovitis   Data Reviewed: I have personally reviewed following labs and imaging studies Basic Metabolic Panel: Recent Labs  Lab 02/18/18 1633 02/19/18 0500 02/20/18 0534 02/23/18 0643  NA 135 136 135 134*  K 4.1 4.2 4.5 4.3  CL 100 99 97* 91*  CO2 25 26 30  34*  GLUCOSE 113* 143* 138* 120*  BUN 17 14 16 19   CREATININE 0.68 0.67 0.73 0.65  CALCIUM 9.1 9.5 9.2 8.9  MG  --   --  2.2 2.3  PHOS  --   --   --  4.1   Liver Function Tests: Recent Labs  Lab 02/19/18 0500  AST 19  ALT 15  ALKPHOS 40  BILITOT 0.7  PROT 7.3  ALBUMIN 4.1   No results for input(s): LIPASE, AMYLASE in the last 168 hours. No results for input(s): AMMONIA in the last 168 hours. Coagulation Profile: No results for input(s): INR, PROTIME in the last 168 hours. CBC: Recent Labs  Lab 02/18/18 1633 02/19/18 0500 02/20/18 0534  WBC 5.7 1.5* 4.4  NEUTROABS 4.6  --  3.8  HGB 12.8* 12.5* 11.6*  HCT 41.1 40.2 37.6*  MCV 93.2 92.4 94.0  PLT 242 243 263   Cardiac Enzymes: Recent Labs  Lab 02/18/18 1633  TROPONINI <0.03   BNP: Invalid input(s): POCBNP CBG: No results for input(s): GLUCAP in the last 168 hours. HbA1C: No results for input(s): HGBA1C in the last 72 hours. Urine analysis:    Component Value Date/Time   COLORURINE YELLOW 04/18/2017 1111   APPEARANCEUR CLEAR 04/18/2017 1111   APPEARANCEUR Clear 11/14/2013 1034   LABSPEC 1.009 04/18/2017 1111   LABSPEC 1.003 11/14/2013 1034   PHURINE 6.0 04/18/2017 1111   GLUCOSEU NEGATIVE 04/18/2017 1111   GLUCOSEU Negative 11/14/2013 1034   HGBUR SMALL (A) 04/18/2017 1111   BILIRUBINUR NEGATIVE 04/18/2017 1111   BILIRUBINUR Negative 11/14/2013 [place] 04/18/2017 1111   PROTEINUR NEGATIVE 04/18/2017 1111   NITRITE NEGATIVE  04/18/2017 1111   LEUKOCYTESUR NEGATIVE 04/18/2017 1111   LEUKOCYTESUR Negative 11/14/2013 1034   Sepsis Labs: @LABRCNTIP (procalcitonin:4,lacticidven:4) ) Recent Results (from the past 240 hour(s))  Respiratory Panel by PCR     Status: Abnormal   Collection Time: 02/19/18  5:58 PM  Result Value Ref Range Status   Adenovirus NOT DETECTED NOT DETECTED Final   Coronavirus 229E NOT DETECTED NOT DETECTED Final    Comment: (NOTE) The Coronavirus on the Respiratory Panel, DOES NOT test for the novel  Coronavirus (2019 nCoV)    Coronavirus HKU1 NOT DETECTED NOT DETECTED Final   Coronavirus NL63 NOT DETECTED NOT DETECTED Final   Coronavirus OC43 NOT DETECTED NOT DETECTED Final   Metapneumovirus NOT DETECTED NOT DETECTED Final   Rhinovirus / Enterovirus DETECTED (A) NOT DETECTED Final   Influenza A NOT DETECTED NOT DETECTED Final   Influenza B NOT DETECTED NOT DETECTED Final   Parainfluenza Virus 1 NOT DETECTED NOT DETECTED Final   Parainfluenza Virus 2 NOT DETECTED NOT DETECTED Final   Parainfluenza Virus 3 NOT DETECTED NOT DETECTED Final   Parainfluenza Virus 4 NOT DETECTED NOT DETECTED Final   Respiratory Syncytial Virus NOT DETECTED NOT DETECTED Final   Bordetella pertussis NOT DETECTED NOT DETECTED Final  Chlamydophila pneumoniae NOT DETECTED NOT DETECTED Final   Mycoplasma pneumoniae NOT DETECTED NOT DETECTED Final    Comment: Performed at [facility], [place] [address], [place], [place] [address]     Scheduled Meds: . acetylcysteine  3 mL Nebulization Q6H  . arformoterol  15 mcg Nebulization BID  . aspirin EC  81 mg Oral Daily  . azithromycin  500 mg Oral Daily  . budesonide (PULMICORT) nebulizer solution  0.5 mg Nebulization BID  . DULoxetine  30 mg Oral BID  . enoxaparin (LOVENOX) injection  40 mg Subcutaneous Q24H  . folic acid  1 mg Oral Daily  . guaiFENesin  600 mg Oral BID  . ipratropium-albuterol  3 mL Nebulization Q6H  . mouth rinse  15 mL Mouth Rinse BID  .  methylPREDNISolone (SOLU-MEDROL) injection  60 mg Intravenous Q6H  . multivitamin with minerals  1 tablet Oral Daily  . thiamine  100 mg Oral Daily   Or  . thiamine  100 mg Intravenous Daily   Continuous Infusions: . sodium chloride 10 mL/hr at 02/18/18 2339    Procedures/Studies: Dg Chest 2 View  Result Date: 02/18/2018 CLINICAL DATA:  Patient complaining of cough, tightness in chest, and fever x 4 days, sob on exertion. COPD EXAM: CHEST - 2 VIEW COMPARISON:  10/31/2017 FINDINGS: Lungs hyperinflated. No focal infiltrate. Coarse perihilar bronchovascular markings. No edema. Heart size normal.  Aortic Atherosclerosis (ICD10-170.0). Chronic blunting of posterior costophrenic angles. Visualized bones unremarkable. IMPRESSION: Hyperinflation without acute or superimposed abnormality. Electronically Signed   By: Lucrezia Europe M.D.   On: 02/18/2018 16:39   Ct Chest W Contrast  Result Date: 02/22/2018 CLINICAL DATA:  COPD, short of breath EXAM: CT CHEST WITH CONTRAST TECHNIQUE: Multidetector CT imaging of the chest was performed during intravenous contrast administration. CONTRAST:  41mL OMNIPAQUE IOHEXOL 300 MG/ML  SOLN COMPARISON:  Radiograph 02/18/2018 FINDINGS: Cardiovascular: Three-vessel coronary artery calcification and aortic atherosclerotic calcification. Mediastinum/Nodes: No axillary supraclavicular adenopathy. No mediastinal hilar adenopathy. No pericardial effusion. Esophagus normal. Lungs/Pleura: Centrilobular emphysema the upper lobes. No suspicious nodularity. No bronchiectasis. No architectural distortion. Upper Abdomen: Limited view of the liver, kidneys, pancreas are unremarkable. Normal adrenal glands. Musculoskeletal: No aggressive osseous lesion. IMPRESSION: 1. Hyperinflated lungs and  centrilobular emphysema. 2. No ground-glass opacities, nodularity, or architectural distortion. 3. Coronary artery calcification and Aortic Atherosclerosis (ICD10-I70.0). Electronically Signed   By:  Suzy Bouchard M.D.   On: 02/22/2018 19:27    Orson Eva, DO  [facility] Pager [phone]  If 7PM-7AM, please contact night-coverage [email or web address] Password [email or web address] 02/23/2018, 5:07 PM   LOS: 4 days

## 2018-02-23 NOTE — Consult Note (Addendum)
Consult requested by: [facility] Dr. Carles Collet Consult requested for: COPD exacerbation  HPI: This is a 63 year old who has history of COPD chronic hypoxic respiratory therapy on 3 L oxygen at night anxiety, continued tobacco abuse, history of alcohol addiction, bipolar disease and posttraumatic stress disorder.  He came to the hospital because of a 2-day history of increasing cough congestion shortness of breath and weakness.  He had fever.  He is continuing to smoke but he says it is only 1 or 2 cigarettes a day now but he does have a 50-pack-year smoking history.  He is not complained of diffuse pain headaches abdominal pain nausea vomiting diarrhea urinary tract symptoms.  He has been on intravenous steroids bronchodilators antibiotics but has been very slow to respond.  He feels like he is still very congested and is not able to cough anything up.  He is on a flutter valve and Mucinex  Past Medical History:  Diagnosis Date  . Alcohol addiction ([place])   . Anxiety   . Bipolar 1 disorder ([place])   . COPD (chronic obstructive pulmonary disease) ([place])   . Depression   . High cholesterol   . Panic   . PTSD (post-traumatic stress disorder)      Family History  Problem Relation Age of Onset  . Coronary artery disease Mother   . Coronary artery disease Father   He is unaware of family history of pulmonary disease  Social History   Socioeconomic History  . Marital status: Single    Spouse name: Not on file  . Number of children: Not on file  . Years of education: Not on file  . Highest education level: Not on file  Occupational History  . Not on file  Social Needs  . Financial resource strain: Not on file  . Food insecurity:    Worry: Not on file    Inability: Not on file  . Transportation needs:    Medical: Not on file    Non-medical: Not on file  Tobacco Use  . Smoking status: Current Some Day Smoker    Packs/day: 0.50    Years: 45.00    Pack years: 22.50    Types:  Cigarettes  . Smokeless tobacco: Never Used  Substance and Sexual Activity  . Alcohol use: No    Frequency: Never  . Drug use: No  . Sexual activity: Yes    Birth control/protection: None  Lifestyle  . Physical activity:    Days per week: Not on file    Minutes per session: Not on file  . Stress: Not on file  Relationships  . Social connections:    Talks on phone: Not on file    Gets together: Not on file    Attends religious service: Not on file    Active member of club or organization: Not on file    Attends meetings of clubs or organizations: Not on file    Relationship status: Not on file  Other Topics Concern  . Not on file  Social History Narrative  . Not on file     ROS: Except as mentioned 10 point review of systems is negative    Objective: Vital signs in last 24 hours: Temp:  [97.6 F (36.4 C)-98.1 F (36.7 C)] 97.6 F (36.4 C) (02/16 0531) Pulse Rate:  [88-96] 88 (02/16 0531) Resp:  [20] 20 (02/16 0531) BP: (147-158)/(83-95) 158/95 (02/16 0531) SpO2:  [96 %-100 %] 98 % (02/16 0754) Weight change:  Last BM Date:  02/20/18  Intake/Output from previous day: 02/15 0701 - 02/16 0700 In: 57 [P.O.:720] Out: 2300 [Urine:2300]  PHYSICAL EXAM Constitutional: He is awake and alert and in no acute distress.  He is coughing during the exam.  Eyes: Pupils react EOMI.  Ears nose mouth and throat: Hearing is grossly normal.  Throat is clear.  Cardiovascular: Heart is regular with normal heart sounds.  Respiratory: Respiratory effort is somewhat increased.  He is wearing nasal oxygen.  He has bilateral rhonchi and end expiratory wheezing.  Gastrointestinal: His abdomen is soft with no masses.  Skin: Warm and dry.  Musculoskeletal: Normal mood and affect neurological: No focal abnormalities psychiatric: He seems anxious  Lab Results: Basic Metabolic Panel: Recent Labs    02/23/18 0643  NA 134*  K 4.3  CL 91*  CO2 34*  GLUCOSE 120*  BUN 19  CREATININE 0.65   CALCIUM 8.9  MG 2.3  PHOS 4.1   Liver Function Tests: No results for input(s): AST, ALT, ALKPHOS, BILITOT, PROT, ALBUMIN in the last 72 hours. No results for input(s): LIPASE, AMYLASE in the last 72 hours. No results for input(s): AMMONIA in the last 72 hours. CBC: No results for input(s): WBC, NEUTROABS, HGB, HCT, MCV, PLT in the last 72 hours. Cardiac Enzymes: No results for input(s): CKTOTAL, CKMB, CKMBINDEX, TROPONINI in the last 72 hours. BNP: No results for input(s): PROBNP in the last 72 hours. D-Dimer: No results for input(s): DDIMER in the last 72 hours. CBG: No results for input(s): GLUCAP in the last 72 hours. Hemoglobin A1C: No results for input(s): HGBA1C in the last 72 hours. Fasting Lipid Panel: No results for input(s): CHOL, HDL, LDLCALC, TRIG, CHOLHDL, LDLDIRECT in the last 72 hours. Thyroid Function Tests: No results for input(s): TSH, T4TOTAL, FREET4, T3FREE, THYROIDAB in the last 72 hours. Anemia Panel: No results for input(s): VITAMINB12, FOLATE, FERRITIN, TIBC, IRON, RETICCTPCT in the last 72 hours. Coagulation: No results for input(s): LABPROT, INR in the last 72 hours. Urine Drug Screen: Drugs of Abuse     Component Value Date/Time   LABOPIA NONE DETECTED 02/16/2016 2023   COCAINSCRNUR NONE DETECTED 02/16/2016 2023   COCAINSCRNUR NEGATIVE 11/14/2013 1034   LABBENZ NONE DETECTED 02/16/2016 2023   AMPHETMU NONE DETECTED 02/16/2016 2023   THCU NONE DETECTED 02/16/2016 2023   LABBARB NONE DETECTED 02/16/2016 2023    Alcohol Level: No results for input(s): ETH in the last 72 hours. Urinalysis: No results for input(s): COLORURINE, LABSPEC, PHURINE, GLUCOSEU, HGBUR, BILIRUBINUR, KETONESUR, PROTEINUR, UROBILINOGEN, NITRITE, LEUKOCYTESUR in the last 72 hours.  Invalid input(s): APPERANCEUR Misc. Labs:   ABGS: No results for input(s): PHART, PO2ART, TCO2, HCO3 in the last 72 hours.  Invalid input(s): PCO2   MICROBIOLOGY: Recent Results (from the  past 240 hour(s))  Respiratory Panel by PCR     Status: Abnormal   Collection Time: 02/19/18  5:58 PM  Result Value Ref Range Status   Adenovirus NOT DETECTED NOT DETECTED Final   Coronavirus 229E NOT DETECTED NOT DETECTED Final    Comment: (NOTE) The Coronavirus on the Respiratory Panel, DOES NOT test for the novel  Coronavirus (2019 nCoV)    Coronavirus HKU1 NOT DETECTED NOT DETECTED Final   Coronavirus NL63 NOT DETECTED NOT DETECTED Final   Coronavirus OC43 NOT DETECTED NOT DETECTED Final   Metapneumovirus NOT DETECTED NOT DETECTED Final   Rhinovirus / Enterovirus DETECTED (A) NOT DETECTED Final   Influenza A NOT DETECTED NOT DETECTED Final   Influenza B NOT DETECTED NOT  DETECTED Final   Parainfluenza Virus 1 NOT DETECTED NOT DETECTED Final   Parainfluenza Virus 2 NOT DETECTED NOT DETECTED Final   Parainfluenza Virus 3 NOT DETECTED NOT DETECTED Final   Parainfluenza Virus 4 NOT DETECTED NOT DETECTED Final   Respiratory Syncytial Virus NOT DETECTED NOT DETECTED Final   Bordetella pertussis NOT DETECTED NOT DETECTED Final   Chlamydophila pneumoniae NOT DETECTED NOT DETECTED Final   Mycoplasma pneumoniae NOT DETECTED NOT DETECTED Final    Comment: Performed at [facility], [place] [address], [place], [place] [address]    Studies/Results: Ct Chest W Contrast  Result Date: 02/22/2018 CLINICAL DATA:  COPD, short of breath EXAM: CT CHEST WITH CONTRAST TECHNIQUE: Multidetector CT imaging of the chest was performed during intravenous contrast administration. CONTRAST:  33mL OMNIPAQUE IOHEXOL 300 MG/ML  SOLN COMPARISON:  Radiograph 02/18/2018 FINDINGS: Cardiovascular: Three-vessel coronary artery calcification and aortic atherosclerotic calcification. Mediastinum/Nodes: No axillary supraclavicular adenopathy. No mediastinal hilar adenopathy. No pericardial effusion. Esophagus normal. Lungs/Pleura: Centrilobular emphysema the upper lobes. No suspicious nodularity. No bronchiectasis. No  architectural distortion. Upper Abdomen: Limited view of the liver, kidneys, pancreas are unremarkable. Normal adrenal glands. Musculoskeletal: No aggressive osseous lesion. IMPRESSION: 1. Hyperinflated lungs and  centrilobular emphysema. 2. No ground-glass opacities, nodularity, or architectural distortion. 3. Coronary artery calcification and Aortic Atherosclerosis (ICD10-I70.0). Electronically Signed   By: Suzy Bouchard M.D.   On: 02/22/2018 19:27    Medications:  Prior to Admission:  Medications Prior to Admission  Medication Sig Dispense Refill Last Dose  . albuterol (PROVENTIL HFA;VENTOLIN HFA) 108 (90 BASE) MCG/ACT inhaler Inhale 2 puffs into the lungs every 4 (four) hours as needed for wheezing or shortness of breath. 1 Inhaler 3 02/18/2018 at Unknown time  . aspirin EC 81 MG tablet Take 1 tablet (81 mg total) by mouth daily.   02/18/2018 at Unknown time  . budesonide-formoterol (SYMBICORT) 160-4.5 MCG/ACT inhaler Inhale 2 puffs into the lungs 2 (two) times daily.   02/18/2018 at morning  . diphenhydrAMINE (BENADRYL) 25 mg capsule Take 25 mg by mouth at bedtime as needed for sleep.    02/17/2018 at Unknown time  . DULoxetine (CYMBALTA) 30 MG capsule Take 30 mg by mouth 2 (two) times daily.   02/18/2018 at morning  . EQ MUCUS ER 600 MG 12 hr tablet Take 600 mg by mouth 2 (two) times daily as needed for cough or to loosen phlegm.    02/18/2018 at Unknown time  . ipratropium-albuterol (DUONEB) 0.5-2.5 (3) MG/3ML SOLN Take 3 mLs by nebulization every 6 (six) hours as needed (shortness of breath). (Patient taking differently: Take 3 mLs by nebulization every 4 (four) hours. ) 360 mL 3 02/18/2018 at Unknown time  . OXYGEN Inhale 3.5-4 L into the lungs continuous.    02/18/2018 at Unknown time  . TRELEGY ELLIPTA 100-62.5-25 MCG/INH AEPB Inhale 1 puff into the lungs every evening.   Not Taking at Unknown time   Scheduled: . acetylcysteine  3 mL Nebulization Q6H  . arformoterol  15 mcg Nebulization BID   . aspirin EC  81 mg Oral Daily  . azithromycin  500 mg Oral Daily  . budesonide (PULMICORT) nebulizer solution  0.5 mg Nebulization BID  . DULoxetine  30 mg Oral BID  . enoxaparin (LOVENOX) injection  40 mg Subcutaneous Q24H  . folic acid  1 mg Oral Daily  . guaiFENesin  600 mg Oral BID  . ipratropium-albuterol  3 mL Nebulization Q6H  . mouth rinse  15 mL  Mouth Rinse BID  . methylPREDNISolone (SOLU-MEDROL) injection  60 mg Intravenous Q6H  . multivitamin with minerals  1 tablet Oral Daily  . thiamine  100 mg Oral Daily   Or  . thiamine  100 mg Intravenous Daily   Continuous: . sodium chloride 10 mL/hr at 02/18/18 2339   YBW:[id number] **OR** acetaminophen, diphenhydrAMINE, ipratropium-albuterol, LORazepam, ondansetron **OR** ondansetron (ZOFRAN) IV  Assesment: He has COPD exacerbation and is being treated with appropriate therapy.  Chest x-ray and CT which I have personally reviewed show emphysema but no infiltrate.  I agree with antibiotics steroids inhaled bronchodilators.  He has chronic hypoxic respiratory failure on oxygen  He has continued tobacco abuse  Active Problems:   COPD exacerbation ([facility])   Tobacco abuse   Chronic respiratory failure with hypoxia ([facility])   Goals of care, counseling/discussion    Plan: Add Mucomyst.  Otherwise he is on maximum therapy.  Thanks for allowing me to participate in his care    LOS: 4 days   Robert [name] 02/23/2018, 10:24 AM

## 2018-02-24 NOTE — Progress Notes (Signed)
Subjective: He says he feels better.  He is able to move some sputum now.  He is less short of breath.  He had discussion with Dr. Carles Collet yesterday and has requested DNR status  Objective: Vital signs in last 24 hours: Temp:  [97.9 F (36.6 C)-98 F (36.7 C)] 98 F (36.7 C) (02/17 0649) Pulse Rate:  [81-99] 81 (02/17 0649) Resp:  [16-20] 20 (02/17 0649) BP: (138-164)/(90-97) 164/97 (02/17 0649) SpO2:  [88 %-100 %] 100 % (02/17 0810) Weight change:  Last BM Date: 02/23/18  Intake/Output from previous day: 02/16 0701 - 02/17 0700 In: 720 [P.O.:720] Out: 650 [Urine:650]  PHYSICAL EXAM General appearance: alert, cooperative and no distress Resp: He has much less rhonchi and wheezing than yesterday. Cardio: regular rate and rhythm, S1, S2 normal, no murmur, click, rub or gallop GI: soft, non-tender; bowel sounds normal; no masses,  no organomegaly Extremities: extremities normal, atraumatic, no cyanosis or edema  Lab Results:  Results for orders placed or performed during the hospital encounter of 02/18/18 (from the past 48 hour(s))  Basic metabolic panel     Status: Abnormal   Collection Time: 02/23/18  6:43 AM  Result Value Ref Range   Sodium 134 (L) 135 - 145 mmol/L   Potassium 4.3 3.5 - 5.1 mmol/L   Chloride 91 (L) 98 - 111 mmol/L   CO2 34 (H) 22 - 32 mmol/L   Glucose, Bld 120 (H) 70 - 99 mg/dL   BUN 19 8 - 23 mg/dL   Creatinine, Ser 0.65 0.61 - 1.24 mg/dL   Calcium 8.9 8.9 - 10.3 mg/dL   GFR calc non Af Amer >60 >60 mL/min   GFR calc Af Amer >60 >60 mL/min   Anion gap 9 5 - 15    Comment: Performed at [facility], [address], [place], [place] [address]  Magnesium     Status: None   Collection Time: 02/23/18  6:43 AM  Result Value Ref Range   Magnesium 2.3 1.7 - 2.4 mg/dL    Comment: Performed at [facility], [address], [place], [place] [address]  Phosphorus     Status: None   Collection Time: 02/23/18  6:43 AM  Result Value Ref Range   Phosphorus 4.1 2.5 -  4.6 mg/dL    Comment: Performed at [facility], [address] [place], [place] [address]  Glucose, capillary     Status: Abnormal   Collection Time: 02/23/18 10:28 PM  Result Value Ref Range   Glucose-Capillary 121 (H) 70 - 99 mg/dL    ABGS No results for input(s): PHART, PO2ART, TCO2, HCO3 in the last 72 hours.  Invalid input(s): PCO2 CULTURES Recent Results (from the past 240 hour(s))  Respiratory Panel by PCR     Status: Abnormal   Collection Time: 02/19/18  5:58 PM  Result Value Ref Range Status   Adenovirus NOT DETECTED NOT DETECTED Final   Coronavirus 229E NOT DETECTED NOT DETECTED Final    Comment: (NOTE) The Coronavirus on the Respiratory Panel, DOES NOT test for the novel  Coronavirus (2019 nCoV)    Coronavirus HKU1 NOT DETECTED NOT DETECTED Final   Coronavirus NL63 NOT DETECTED NOT DETECTED Final   Coronavirus OC43 NOT DETECTED NOT DETECTED Final   Metapneumovirus NOT DETECTED NOT DETECTED Final   Rhinovirus / Enterovirus DETECTED (A) NOT DETECTED Final   Influenza A NOT DETECTED NOT DETECTED Final   Influenza B NOT DETECTED NOT DETECTED Final   Parainfluenza Virus 1 NOT DETECTED NOT  DETECTED Final   Parainfluenza Virus 2 NOT DETECTED NOT DETECTED Final   Parainfluenza Virus 3 NOT DETECTED NOT DETECTED Final   Parainfluenza Virus 4 NOT DETECTED NOT DETECTED Final   Respiratory Syncytial Virus NOT DETECTED NOT DETECTED Final   Bordetella pertussis NOT DETECTED NOT DETECTED Final   Chlamydophila pneumoniae NOT DETECTED NOT DETECTED Final   Mycoplasma pneumoniae NOT DETECTED NOT DETECTED Final    Comment: Performed at [facility], [place] [address], [place], [place] [address]   Studies/Results: Ct Chest W Contrast  Result Date: 02/22/2018 CLINICAL DATA:  COPD, short of breath EXAM: CT CHEST WITH CONTRAST TECHNIQUE: Multidetector CT imaging of the chest was performed during intravenous contrast administration. CONTRAST:  30mL OMNIPAQUE IOHEXOL 300 MG/ML  SOLN  COMPARISON:  Radiograph 02/18/2018 FINDINGS: Cardiovascular: Three-vessel coronary artery calcification and aortic atherosclerotic calcification. Mediastinum/Nodes: No axillary supraclavicular adenopathy. No mediastinal hilar adenopathy. No pericardial effusion. Esophagus normal. Lungs/Pleura: Centrilobular emphysema the upper lobes. No suspicious nodularity. No bronchiectasis. No architectural distortion. Upper Abdomen: Limited view of the liver, kidneys, pancreas are unremarkable. Normal adrenal glands. Musculoskeletal: No aggressive osseous lesion. IMPRESSION: 1. Hyperinflated lungs and  centrilobular emphysema. 2. No ground-glass opacities, nodularity, or architectural distortion. 3. Coronary artery calcification and Aortic Atherosclerosis (ICD10-I70.0). Electronically Signed   By: Suzy Bouchard M.D.   On: 02/22/2018 19:27    Medications:  Prior to Admission:  Medications Prior to Admission  Medication Sig Dispense Refill Last Dose  . albuterol (PROVENTIL HFA;VENTOLIN HFA) 108 (90 BASE) MCG/ACT inhaler Inhale 2 puffs into the lungs every 4 (four) hours as needed for wheezing or shortness of breath. 1 Inhaler 3 02/18/2018 at Unknown time  . aspirin EC 81 MG tablet Take 1 tablet (81 mg total) by mouth daily.   02/18/2018 at Unknown time  . budesonide-formoterol (SYMBICORT) 160-4.5 MCG/ACT inhaler Inhale 2 puffs into the lungs 2 (two) times daily.   02/18/2018 at morning  . diphenhydrAMINE (BENADRYL) 25 mg capsule Take 25 mg by mouth at bedtime as needed for sleep.    02/17/2018 at Unknown time  . DULoxetine (CYMBALTA) 30 MG capsule Take 30 mg by mouth 2 (two) times daily.   02/18/2018 at morning  . EQ MUCUS ER 600 MG 12 hr tablet Take 600 mg by mouth 2 (two) times daily as needed for cough or to loosen phlegm.    02/18/2018 at Unknown time  . ipratropium-albuterol (DUONEB) 0.5-2.5 (3) MG/3ML SOLN Take 3 mLs by nebulization every 6 (six) hours as needed (shortness of breath). (Patient taking differently:  Take 3 mLs by nebulization every 4 (four) hours. ) 360 mL 3 02/18/2018 at Unknown time  . OXYGEN Inhale 3.5-4 L into the lungs continuous.    02/18/2018 at Unknown time  . TRELEGY ELLIPTA 100-62.5-25 MCG/INH AEPB Inhale 1 puff into the lungs every evening.   Not Taking at Unknown time   Scheduled: . acetylcysteine  3 mL Nebulization Q6H  . arformoterol  15 mcg Nebulization BID  . aspirin EC  81 mg Oral Daily  . azithromycin  500 mg Oral Daily  . budesonide (PULMICORT) nebulizer solution  0.5 mg Nebulization BID  . DULoxetine  30 mg Oral BID  . enoxaparin (LOVENOX) injection  40 mg Subcutaneous Q24H  . folic acid  1 mg Oral Daily  . guaiFENesin  600 mg Oral BID  . ipratropium-albuterol  3 mL Nebulization Q6H  . mouth rinse  15 mL Mouth Rinse BID  . methylPREDNISolone (SOLU-MEDROL) injection  60 mg  Intravenous Q6H  . multivitamin with minerals  1 tablet Oral Daily  . thiamine  100 mg Oral Daily   Or  . thiamine  100 mg Intravenous Daily   Continuous: . sodium chloride 10 mL/hr at 02/18/18 2339   KTG:[id number] **OR** acetaminophen, diphenhydrAMINE, ipratropium-albuterol, LORazepam, ondansetron **OR** ondansetron (ZOFRAN) IV  Assesment: He was admitted with COPD exacerbation and he was slow to recover.  He is better now.  He uses oxygen at home but only at night and he is still requiring oxygen 24/7 now.  He had a lot of difficulty in coughing up sputum and that is better.  He has tobacco abuse but minimal Active Problems:   COPD exacerbation (HCC)   Tobacco abuse   Chronic respiratory failure with hypoxia (HCC)   Goals of care, counseling/discussion    Plan: Continue current treatments.  He has improved substantially from yesterday.    LOS: 5 days   Alonza Bogus 02/24/2018, 8:40 AM

## 2018-02-24 NOTE — Progress Notes (Signed)
PROGRESS NOTE  Robert [name] BSW:[id number] DOB: [birth year] DOA: 02/18/2018 PCP: [facility]  Brief History: 63 year old male with a history of COPD, chronic respiratory failure on 3 L, anxiety, and tobacco abuse presenting with2-day history of increasing coughing, shortness of breath, and generalized weakness. The patient denied any chest pain, nausea, vomiting, diarrhea. Patient states that he had some fevers between 100.0 and 101.0 F. He continues to smoke 1 to 2 cigarettes/day. He denies any headache, neck pain, rashes, abdominal pain, hematochezia, melena. He has a 50-pack-year history of tobacco. The patient was started on intravenous steroids and bronchodilators for COPD. Unfortunately, the patient was very slow to improve. Pulmonary medicine was consulted to assist.  Assessment/Plan: COPD exacerbation -ContinuePulmicort -continueBrovana -Continue duo nebs -Continue intravenous Solu-Medrol -Viral respiratory panel--+rhino/enterovirus -continues to have dyspnea with minimal exertion and wheezing -consult pulmonary-->added mucomyst -continue azithromycin -CT chest--centrilobular emphysema, no consolidation, no bronchiectasis -added lorazepam prn anxiety, sob -added flutter valve -2/17--some improvement clinically, but remains sob with mild exertion  Chronic respiratory failure with hypoxia -Chronically on 3 L nasal cannula -Personally reviewed chest x-ray--increased interstitial markings without consolidation -personally reviewed EKG--sinus, no STT changes  Tobacco abuse -I have discussed tobacco cessation with the patient. I have counseled the patient regarding the negative impacts of continued tobacco use including but not limited to lung cancer, COPD, and cardiovascular disease. I have discussed alternatives to tobacco and modalities that may help facilitate tobacco cessation including but not limited to biofeedback,  hypnosis, and medications. Total time spent with tobacco counseling was 4 minutes.  Leukopenia -likely due to viral infection -viral respiratory panel--+rhino/enterovirus -influenza neg -am CBC with diff  alcohol abuse, in remission -CIWA -no signs of withdrawal  GOC -long discussion with patient -pt changed to DNR -add ativan prn anxiety, sob Advance care planning, including the explanation and discussion of advance directives was carried out with the patient and family. Code status including explanations of "Full Code" and "DNR" and alternatives were discussed in detail. Discussion of end-of-life issues including but not limited palliative care, hospice care and the concept of hospice, other end-of-life care options, power of attorney for health care decisions, living wills, and physician orders for life-sustaining treatment were also discussed with the patient and family. Total face to face time 16 minutes.    Disposition Plan: Home 2/18 or 2/19 if ok with pulm Family Communication:NoFamily at bedside  Consultants:none  Code Status: FULL   DVT Prophylaxis: [place] Lovenox   Procedures: As Listed in Progress Note Above  Antibiotics: azithro 2/15>>>        Subjective: Pt breathing a little better.  Less wheezing, but remains sob with mild exertion.  Denies f/c, cp, n/v/d, abd pain.  He is able to mobilize some sputum with cough today  Objective: Vitals:   02/24/18 0752 02/24/18 0801 02/24/18 0810 02/24/18 1456  BP:      Pulse:      Resp:      Temp:      TempSrc:      SpO2: 96% 100% 100% 94%  Weight:      Height:        Intake/Output Summary (Last 24 hours) at 02/24/2018 1728 Last data filed at 02/24/2018 0500 Gross per 24 hour  Intake -  Output 650 ml  Net -650 ml   Weight change:  Exam:   General:  Pt is alert, follows commands appropriately, not in acute distress  HEENT: No icterus, No thrush, No neck mass,  [place]/AT  Cardiovascular: RRR, S1/S2, no rubs, no gallops  Respiratory:diminished BS, bibasilar wheeze; scattered rales  Abdomen: Soft/+BS, non tender, non distended, no guarding  Extremities: No edema, No lymphangitis, No petechiae, No rashes, no synovitis   Data Reviewed: I have personally reviewed following labs and imaging studies Basic Metabolic Panel: Recent Labs  Lab 02/18/18 1633 02/19/18 0500 02/20/18 0534 02/23/18 0643  NA 135 136 135 134*  K 4.1 4.2 4.5 4.3  CL 100 99 97* 91*  CO2 25 26 30  34*  GLUCOSE 113* 143* 138* 120*  BUN 17 14 16 19   CREATININE 0.68 0.67 0.73 0.65  CALCIUM 9.1 9.5 9.2 8.9  MG  --   --  2.2 2.3  PHOS  --   --   --  4.1   Liver Function Tests: Recent Labs  Lab 02/19/18 0500  AST 19  ALT 15  ALKPHOS 40  BILITOT 0.7  PROT 7.3  ALBUMIN 4.1   No results for input(s): LIPASE, AMYLASE in the last 168 hours. No results for input(s): AMMONIA in the last 168 hours. Coagulation Profile: No results for input(s): INR, PROTIME in the last 168 hours. CBC: Recent Labs  Lab 02/18/18 1633 02/19/18 0500 02/20/18 0534  WBC 5.7 1.5* 4.4  NEUTROABS 4.6  --  3.8  HGB 12.8* 12.5* 11.6*  HCT 41.1 40.2 37.6*  MCV 93.2 92.4 94.0  PLT 242 243 263   Cardiac Enzymes: Recent Labs  Lab 02/18/18 1633  TROPONINI <0.03   BNP: Invalid input(s): POCBNP CBG: Recent Labs  Lab 02/23/18 2228  GLUCAP 121*   HbA1C: No results for input(s): HGBA1C in the last 72 hours. Urine analysis:    Component Value Date/Time   COLORURINE YELLOW 04/18/2017 1111   APPEARANCEUR CLEAR 04/18/2017 1111   APPEARANCEUR Clear 11/14/2013 1034   LABSPEC 1.009 04/18/2017 1111   LABSPEC 1.003 11/14/2013 1034   PHURINE 6.0 04/18/2017 1111   GLUCOSEU NEGATIVE 04/18/2017 1111   GLUCOSEU Negative 11/14/2013 1034   HGBUR SMALL (A) 04/18/2017 1111   BILIRUBINUR NEGATIVE 04/18/2017 1111   BILIRUBINUR Negative 11/14/2013 [place] 04/18/2017 1111   PROTEINUR  NEGATIVE 04/18/2017 1111   NITRITE NEGATIVE 04/18/2017 1111   LEUKOCYTESUR NEGATIVE 04/18/2017 1111   LEUKOCYTESUR Negative 11/14/2013 1034   Sepsis Labs: @LABRCNTIP (procalcitonin:4,lacticidven:4) ) Recent Results (from the past 240 hour(s))  Respiratory Panel by PCR     Status: Abnormal   Collection Time: 02/19/18  5:58 PM  Result Value Ref Range Status   Adenovirus NOT DETECTED NOT DETECTED Final   Coronavirus 229E NOT DETECTED NOT DETECTED Final    Comment: (NOTE) The Coronavirus on the Respiratory Panel, DOES NOT test for the novel  Coronavirus (2019 nCoV)    Coronavirus HKU1 NOT DETECTED NOT DETECTED Final   Coronavirus NL63 NOT DETECTED NOT DETECTED Final   Coronavirus OC43 NOT DETECTED NOT DETECTED Final   Metapneumovirus NOT DETECTED NOT DETECTED Final   Rhinovirus / Enterovirus DETECTED (A) NOT DETECTED Final   Influenza A NOT DETECTED NOT DETECTED Final   Influenza B NOT DETECTED NOT DETECTED Final   Parainfluenza Virus 1 NOT DETECTED NOT DETECTED Final   Parainfluenza Virus 2 NOT DETECTED NOT DETECTED Final   Parainfluenza Virus 3 NOT DETECTED NOT DETECTED Final   Parainfluenza Virus 4 NOT DETECTED NOT DETECTED Final   Respiratory Syncytial Virus NOT DETECTED NOT DETECTED Final   Bordetella pertussis NOT DETECTED NOT DETECTED Final  Chlamydophila pneumoniae NOT DETECTED NOT DETECTED Final   Mycoplasma pneumoniae NOT DETECTED NOT DETECTED Final    Comment: Performed at [facility], [place] [address], [place], [place] [address]     Scheduled Meds: . acetylcysteine  3 mL Nebulization Q6H  . arformoterol  15 mcg Nebulization BID  . aspirin EC  81 mg Oral Daily  . azithromycin  500 mg Oral Daily  . budesonide (PULMICORT) nebulizer solution  0.5 mg Nebulization BID  . DULoxetine  30 mg Oral BID  . enoxaparin (LOVENOX) injection  40 mg Subcutaneous Q24H  . folic acid  1 mg Oral Daily  . guaiFENesin  600 mg Oral BID  . ipratropium-albuterol  3 mL Nebulization Q6H   . mouth rinse  15 mL Mouth Rinse BID  . methylPREDNISolone (SOLU-MEDROL) injection  60 mg Intravenous Q6H  . multivitamin with minerals  1 tablet Oral Daily  . thiamine  100 mg Oral Daily   Or  . thiamine  100 mg Intravenous Daily   Continuous Infusions: . sodium chloride 10 mL/hr at 02/18/18 2339    Procedures/Studies: Dg Chest 2 View  Result Date: 02/18/2018 CLINICAL DATA:  Patient complaining of cough, tightness in chest, and fever x 4 days, sob on exertion. COPD EXAM: CHEST - 2 VIEW COMPARISON:  10/31/2017 FINDINGS: Lungs hyperinflated. No focal infiltrate. Coarse perihilar bronchovascular markings. No edema. Heart size normal.  Aortic Atherosclerosis (ICD10-170.0). Chronic blunting of posterior costophrenic angles. Visualized bones unremarkable. IMPRESSION: Hyperinflation without acute or superimposed abnormality. Electronically Signed   By: Lucrezia Europe M.D.   On: 02/18/2018 16:39   Ct Chest W Contrast  Result Date: 02/22/2018 CLINICAL DATA:  COPD, short of breath EXAM: CT CHEST WITH CONTRAST TECHNIQUE: Multidetector CT imaging of the chest was performed during intravenous contrast administration. CONTRAST:  29mL OMNIPAQUE IOHEXOL 300 MG/ML  SOLN COMPARISON:  Radiograph 02/18/2018 FINDINGS: Cardiovascular: Three-vessel coronary artery calcification and aortic atherosclerotic calcification. Mediastinum/Nodes: No axillary supraclavicular adenopathy. No mediastinal hilar adenopathy. No pericardial effusion. Esophagus normal. Lungs/Pleura: Centrilobular emphysema the upper lobes. No suspicious nodularity. No bronchiectasis. No architectural distortion. Upper Abdomen: Limited view of the liver, kidneys, pancreas are unremarkable. Normal adrenal glands. Musculoskeletal: No aggressive osseous lesion. IMPRESSION: 1. Hyperinflated lungs and  centrilobular emphysema. 2. No ground-glass opacities, nodularity, or architectural distortion. 3. Coronary artery calcification and Aortic Atherosclerosis  (ICD10-I70.0). Electronically Signed   By: Suzy Bouchard M.D.   On: 02/22/2018 19:27    Orson Eva, DO  [facility] Pager [phone]  If 7PM-7AM, please contact night-coverage [email or web address] Password [email or web address] 02/24/2018, 5:28 PM   LOS: 5 days

## 2018-02-25 LAB — CREATININE, SERUM
CREATININE: 0.7 mg/dL (ref 0.61–1.24)
GFR calc Af Amer: >60 mL/min (ref >60)
GFR calc non Af Amer: >60 mL/min (ref >60)

## 2018-02-25 MED ORDER — PREDNISONE 10 MG PO TABS: 60.0000 mg | ORAL_TABLET | Freq: Every day | ORAL | 0 refills | Status: DC

## 2018-02-25 MED ORDER — PREDNISONE 20 MG PO TABS: 60.0000 mg | ORAL_TABLET | Freq: Every day | ORAL | Status: DC

## 2018-02-25 MED ORDER — AZITHROMYCIN 500 MG PO TABS: 500.0000 mg | ORAL_TABLET | Freq: Once | ORAL | 0 refills | Status: AC

## 2018-02-25 MED ORDER — FLUTICASONE-SALMETEROL 250-50 MCG/DOSE IN AEPB: 1.0000 | INHALATION_SPRAY | Freq: Two times a day (BID) | RESPIRATORY_TRACT | 1 refills | Status: DC

## 2018-02-25 NOTE — Care Management Important Message (Signed)
Important Message  Patient Details  Name: Robert [name] MRN: [id number] Date of Birth: 05/24/1955   Medicare Important Message Given:  Yes    Sherald Barge, RN 02/25/2018, 12:22 PM

## 2018-02-25 NOTE — Discharge Summary (Signed)
Physician Discharge Summary  MITCH ARQUETTE YQI:[id number] DOB: 29-Nov-1955 DOA: 02/18/2018  PCP: [facility] date: 02/18/2018 Discharge date: 02/25/2018  Admitted From: Home Disposition:  Home  Recommendations for Outpatient Follow-up:  1. Follow up with PCP in 1-2 weeks 2. Please obtain BMP/CBC in one week   Home Health:Yes Equipment/Devices:3.5L [place]  Discharge Condition: Stable CODE STATUS: DNR Diet recommendation: Heart Healthy    Brief/Interim Summary: 63 year old male with a history of COPD, chronic respiratory failure on 3 L, anxiety, and tobacco abuse presenting with2-day history of increasing coughing, shortness of breath, and generalized weakness. The patient denied any chest pain, nausea, vomiting, diarrhea. Patient states that he had some fevers between 100.0 and 101.0 F. He continues to smoke 1 to 2 cigarettes/day. He denies any headache, neck pain, rashes, abdominal pain, hematochezia, melena. He has a 50-pack-year history of tobacco. The patient was started on intravenous steroids and bronchodilators for COPD. Unfortunately, the patient was very slow to improve. Pulmonary medicine was consulted to assist.  Mucomyst was added.  Pt ultimately showed slow improvement.  Discharge Diagnoses:  COPD exacerbation -ContinuePulmicort -continueBrovana -Continue duo nebs -Continue intravenous Solu-Medrol>>>d/c home with prednisone taper -Viral respiratory panel--+rhino/enterovirus -continues to have dyspnea with minimal exertion and wheezing -consult pulmonary-->added mucomyst -continue azithromycin--finish 5 days -CT chest--centrilobular emphysema, no consolidation, no bronchiectasis -addedlorazepam prn anxiety, sob -addedflutter valve   Chronic respiratory failure with hypoxia -Chronically on 3 L nasal cannula -Personally reviewed chest x-ray--increased interstitial markings without consolidation -personally reviewed  EKG--sinus, no STT changes  Tobacco abuse -I have discussed tobacco cessation with the patient. I have counseled the patient regarding the negative impacts of continued tobacco use including but not limited to lung cancer, COPD, and cardiovascular disease. I have discussed alternatives to tobacco and modalities that may help facilitate tobacco cessation including but not limited to biofeedback, hypnosis, and medications. Total time spent with tobacco counseling was 4 minutes.  Leukopenia -likely due to viral infection -viral respiratory panel--+rhino/enterovirus -influenza neg -am CBC with diff -improved as pt clinically improved  alcohol abuse, in remission -CIWA -no signs of withdrawal  GOC -long discussion with patient -pt changed to DNR -add ativan prn anxiety, sob Advance care planning, including the explanation and discussion of advance directives was carried out with the patient and family. Code status including explanations of "Full Code" and "DNR" and alternatives were discussed in detail. Discussion of end-of-life issues including but not limited palliative care, hospice care and the concept of hospice, other end-of-life care options, power of attorney for health care decisions, living wills, and physician orders for life-sustaining treatment were also discussed with the patient and family. Total face to face time 16 minutes.   Discharge Instructions   Allergies as of 02/25/2018   Not on File     Medication List    STOP taking these medications   budesonide-formoterol 160-4.5 MCG/ACT inhaler Commonly known as:  SYMBICORT   TRELEGY ELLIPTA 100-62.5-25 MCG/INH Aepb Generic drug:  Fluticasone-Umeclidin-Vilant     TAKE these medications   albuterol 108 (90 Base) MCG/ACT inhaler Commonly known as:  PROVENTIL HFA;VENTOLIN HFA Inhale 2 puffs into the lungs every 4 (four) hours as needed for wheezing or shortness of breath.   aspirin EC 81 MG tablet Take 1  tablet (81 mg total) by mouth daily.   azithromycin 500 MG tablet Commonly known as:  ZITHROMAX Take 1 tablet (500 mg total) by mouth once for 1 dose. On 02/26/18 Start taking on:  February 26, 2018   diphenhydrAMINE 25 mg capsule Commonly known as:  BENADRYL Take 25 mg by mouth at bedtime as needed for sleep.   DULoxetine 30 MG capsule Commonly known as:  CYMBALTA Take 30 mg by mouth 2 (two) times daily.   EQ MUCUS ER 600 MG 12 hr tablet Generic drug:  guaiFENesin Take 600 mg by mouth 2 (two) times daily as needed for cough or to loosen phlegm.   Fluticasone-Salmeterol 250-50 MCG/DOSE Aepb Commonly known as:  ADVAIR DISKUS Inhale 1 puff into the lungs 2 (two) times daily.   ipratropium-albuterol 0.5-2.5 (3) MG/3ML Soln Commonly known as:  DUONEB Take 3 mLs by nebulization every 6 (six) hours as needed (shortness of breath). What changed:  when to take this   OXYGEN Inhale 3.5-4 L into the lungs continuous.   predniSONE 10 MG tablet Commonly known as:  DELTASONE Take 6 tablets (60 mg total) by mouth daily with breakfast. And decrease by 1 tablet every 2 days Start taking on:  February 26, 2018       Not on File  Consultations:  pulm   Procedures/Studies: Dg Chest 2 View  Result Date: 02/18/2018 CLINICAL DATA:  Patient complaining of cough, tightness in chest, and fever x 4 days, sob on exertion. COPD EXAM: CHEST - 2 VIEW COMPARISON:  10/31/2017 FINDINGS: Lungs hyperinflated. No focal infiltrate. Coarse perihilar bronchovascular markings. No edema. Heart size normal.  Aortic Atherosclerosis (ICD10-170.0). Chronic blunting of posterior costophrenic angles. Visualized bones unremarkable. IMPRESSION: Hyperinflation without acute or superimposed abnormality. Electronically Signed   By: Lucrezia Europe M.D.   On: 02/18/2018 16:39   Ct Chest W Contrast  Result Date: 02/22/2018 CLINICAL DATA:  COPD, short of breath EXAM: CT CHEST WITH CONTRAST TECHNIQUE: Multidetector CT  imaging of the chest was performed during intravenous contrast administration. CONTRAST:  22mL OMNIPAQUE IOHEXOL 300 MG/ML  SOLN COMPARISON:  Radiograph 02/18/2018 FINDINGS: Cardiovascular: Three-vessel coronary artery calcification and aortic atherosclerotic calcification. Mediastinum/Nodes: No axillary supraclavicular adenopathy. No mediastinal hilar adenopathy. No pericardial effusion. Esophagus normal. Lungs/Pleura: Centrilobular emphysema the upper lobes. No suspicious nodularity. No bronchiectasis. No architectural distortion. Upper Abdomen: Limited view of the liver, kidneys, pancreas are unremarkable. Normal adrenal glands. Musculoskeletal: No aggressive osseous lesion. IMPRESSION: 1. Hyperinflated lungs and  centrilobular emphysema. 2. No ground-glass opacities, nodularity, or architectural distortion. 3. Coronary artery calcification and Aortic Atherosclerosis (ICD10-I70.0). Electronically Signed   By: Suzy Bouchard M.D.   On: 02/22/2018 19:27         Discharge Exam: Vitals:   02/25/18 0745 02/25/18 0953  BP:    Pulse:    Resp:    Temp:    SpO2: 96% 96%   Vitals:   02/24/18 2141 02/25/18 0623 02/25/18 0745 02/25/18 0953  BP: (!) 145/88 (!) 152/94    Pulse: 73     Resp: 18 17    Temp: 97.6 F (36.4 C) (!) 97.3 F (36.3 C)    TempSrc: Oral Oral    SpO2: 98%  96% 96%  Weight:      Height:        General: Pt is alert, awake, not in acute distress Cardiovascular: RRR, S1/S2 +, no rubs, no gallops Respiratory: bibasilar rales. Bibasilar wheeze Abdominal: Soft, NT, ND, bowel sounds + Extremities: no edema, no cyanosis   The results of significant diagnostics from this hospitalization (including imaging, microbiology, ancillary and laboratory) are listed below for reference.    Significant Diagnostic Studies: Dg Chest 2 View  Result Date:  02/18/2018 CLINICAL DATA:  Patient complaining of cough, tightness in chest, and fever x 4 days, sob on exertion. COPD EXAM: CHEST -  2 VIEW COMPARISON:  10/31/2017 FINDINGS: Lungs hyperinflated. No focal infiltrate. Coarse perihilar bronchovascular markings. No edema. Heart size normal.  Aortic Atherosclerosis (ICD10-170.0). Chronic blunting of posterior costophrenic angles. Visualized bones unremarkable. IMPRESSION: Hyperinflation without acute or superimposed abnormality. Electronically Signed   By: Lucrezia Europe M.D.   On: 02/18/2018 16:39   Ct Chest W Contrast  Result Date: 02/22/2018 CLINICAL DATA:  COPD, short of breath EXAM: CT CHEST WITH CONTRAST TECHNIQUE: Multidetector CT imaging of the chest was performed during intravenous contrast administration. CONTRAST:  9mL OMNIPAQUE IOHEXOL 300 MG/ML  SOLN COMPARISON:  Radiograph 02/18/2018 FINDINGS: Cardiovascular: Three-vessel coronary artery calcification and aortic atherosclerotic calcification. Mediastinum/Nodes: No axillary supraclavicular adenopathy. No mediastinal hilar adenopathy. No pericardial effusion. Esophagus normal. Lungs/Pleura: Centrilobular emphysema the upper lobes. No suspicious nodularity. No bronchiectasis. No architectural distortion. Upper Abdomen: Limited view of the liver, kidneys, pancreas are unremarkable. Normal adrenal glands. Musculoskeletal: No aggressive osseous lesion. IMPRESSION: 1. Hyperinflated lungs and  centrilobular emphysema. 2. No ground-glass opacities, nodularity, or architectural distortion. 3. Coronary artery calcification and Aortic Atherosclerosis (ICD10-I70.0). Electronically Signed   By: Suzy Bouchard M.D.   On: 02/22/2018 19:27     Microbiology: Recent Results (from the past 240 hour(s))  Respiratory Panel by PCR     Status: Abnormal   Collection Time: 02/19/18  5:58 PM  Result Value Ref Range Status   Adenovirus NOT DETECTED NOT DETECTED Final   Coronavirus 229E NOT DETECTED NOT DETECTED Final    Comment: (NOTE) The Coronavirus on the Respiratory Panel, DOES NOT test for the novel  Coronavirus (2019 nCoV)    Coronavirus  HKU1 NOT DETECTED NOT DETECTED Final   Coronavirus NL63 NOT DETECTED NOT DETECTED Final   Coronavirus OC43 NOT DETECTED NOT DETECTED Final   Metapneumovirus NOT DETECTED NOT DETECTED Final   Rhinovirus / Enterovirus DETECTED (A) NOT DETECTED Final   Influenza A NOT DETECTED NOT DETECTED Final   Influenza B NOT DETECTED NOT DETECTED Final   Parainfluenza Virus 1 NOT DETECTED NOT DETECTED Final   Parainfluenza Virus 2 NOT DETECTED NOT DETECTED Final   Parainfluenza Virus 3 NOT DETECTED NOT DETECTED Final   Parainfluenza Virus 4 NOT DETECTED NOT DETECTED Final   Respiratory Syncytial Virus NOT DETECTED NOT DETECTED Final   Bordetella pertussis NOT DETECTED NOT DETECTED Final   Chlamydophila pneumoniae NOT DETECTED NOT DETECTED Final   Mycoplasma pneumoniae NOT DETECTED NOT DETECTED Final    Comment: Performed at [facility], [place]. [address] [place], [place] [address]     Labs: Basic Metabolic Panel: Recent Labs  Lab 02/18/18 1633 02/19/18 0500 02/20/18 0534 02/23/18 0643 02/25/18 0543  NA 135 136 135 134*  --   K 4.1 4.2 4.5 4.3  --   CL 100 99 97* 91*  --   CO2 25 26 30  34*  --   GLUCOSE 113* 143* 138* 120*  --   BUN 17 14 16 19   --   CREATININE 0.68 0.67 0.73 0.65 0.70  CALCIUM 9.1 9.5 9.2 8.9  --   MG  --   --  2.2 2.3  --   PHOS  --   --   --  4.1  --    Liver Function Tests: Recent Labs  Lab 02/19/18 0500  AST 19  ALT 15  ALKPHOS 40  BILITOT 0.7  PROT  7.3  ALBUMIN 4.1   No results for input(s): LIPASE, AMYLASE in the last 168 hours. No results for input(s): AMMONIA in the last 168 hours. CBC: Recent Labs  Lab 02/18/18 1633 02/19/18 0500 02/20/18 0534  WBC 5.7 1.5* 4.4  NEUTROABS 4.6  --  3.8  HGB 12.8* 12.5* 11.6*  HCT 41.1 40.2 37.6*  MCV 93.2 92.4 94.0  PLT 242 243 263   Cardiac Enzymes: Recent Labs  Lab 02/18/18 1633  TROPONINI <0.03   BNP: Invalid input(s): POCBNP CBG: Recent Labs  Lab 02/23/18 2228  GLUCAP 121*    Time  coordinating discharge:  36 minutes  Signed:  Orson Eva, DO [facility] Pager: [phone] 02/25/2018, 12:13 PM

## 2018-02-25 NOTE — Progress Notes (Signed)
Patient's IV catheter removed and intact. Discharge instructions reviewed and discussed with patient. All questions were answered and no further questions at this time. Pt in stable condition and in no acute distress at time of discharge. Pt escorted by nurse tech.

## 2018-02-25 NOTE — Progress Notes (Signed)
Subjective: He says he feels better.  He is producing sputum now.  He is less short of breath.  His oxygen requirement is down.  Objective: Vital signs in last 24 hours: Temp:  [97.3 F (36.3 C)-97.6 F (36.4 C)] 97.3 F (36.3 C) (02/18 0623) Pulse Rate:  [73-78] 73 (02/17 2141) Resp:  [17-20] 17 (02/18 0623) BP: (145-152)/(88-94) 152/94 (02/18 0623) SpO2:  [94 %-98 %] 96 % (02/18 0745) Weight change:  Last BM Date: 02/24/18  Intake/Output from previous day: 02/17 0701 - 02/18 0700 In: -  Out: 800 [Urine:800]  PHYSICAL EXAM General appearance: alert, cooperative and no distress Resp: He has prolonged expiratory phase but he is not wheezing now Cardio: regular rate and rhythm, S1, S2 normal, no murmur, click, rub or gallop GI: soft, non-tender; bowel sounds normal; no masses,  no organomegaly Extremities: extremities normal, atraumatic, no cyanosis or edema  Lab Results:  Results for orders placed or performed during the hospital encounter of 02/18/18 (from the past 48 hour(s))  Glucose, capillary     Status: Abnormal   Collection Time: 02/23/18 10:28 PM  Result Value Ref Range   Glucose-Capillary 121 (H) 70 - 99 mg/dL  Creatinine, serum     Status: None   Collection Time: 02/25/18  5:43 AM  Result Value Ref Range   Creatinine, Ser 0.70 0.61 - 1.24 mg/dL   GFR calc non Af Amer >60 >60 mL/min   GFR calc Af Amer >60 >60 mL/min    Comment: Performed at [facility], [address] [place], [place] [address]    ABGS No results for input(s): PHART, PO2ART, TCO2, HCO3 in the last 72 hours.  Invalid input(s): PCO2 CULTURES Recent Results (from the past 240 hour(s))  Respiratory Panel by PCR     Status: Abnormal   Collection Time: 02/19/18  5:58 PM  Result Value Ref Range Status   Adenovirus NOT DETECTED NOT DETECTED Final   Coronavirus 229E NOT DETECTED NOT DETECTED Final    Comment: (NOTE) The Coronavirus on the Respiratory Panel, DOES NOT test for the novel   Coronavirus (2019 nCoV)    Coronavirus HKU1 NOT DETECTED NOT DETECTED Final   Coronavirus NL63 NOT DETECTED NOT DETECTED Final   Coronavirus OC43 NOT DETECTED NOT DETECTED Final   Metapneumovirus NOT DETECTED NOT DETECTED Final   Rhinovirus / Enterovirus DETECTED (A) NOT DETECTED Final   Influenza A NOT DETECTED NOT DETECTED Final   Influenza B NOT DETECTED NOT DETECTED Final   Parainfluenza Virus 1 NOT DETECTED NOT DETECTED Final   Parainfluenza Virus 2 NOT DETECTED NOT DETECTED Final   Parainfluenza Virus 3 NOT DETECTED NOT DETECTED Final   Parainfluenza Virus 4 NOT DETECTED NOT DETECTED Final   Respiratory Syncytial Virus NOT DETECTED NOT DETECTED Final   Bordetella pertussis NOT DETECTED NOT DETECTED Final   Chlamydophila pneumoniae NOT DETECTED NOT DETECTED Final   Mycoplasma pneumoniae NOT DETECTED NOT DETECTED Final    Comment: Performed at [facility], [address], [place], [place] [address]   Studies/Results: No results found.  Medications:  Prior to Admission:  Medications Prior to Admission  Medication Sig Dispense Refill Last Dose  . albuterol (PROVENTIL HFA;VENTOLIN HFA) 108 (90 BASE) MCG/ACT inhaler Inhale 2 puffs into the lungs every 4 (four) hours as needed for wheezing or shortness of breath. 1 Inhaler 3 02/18/2018 at Unknown time  . aspirin EC 81 MG tablet Take 1 tablet (81 mg total) by mouth daily.   02/18/2018 at  Unknown time  . budesonide-formoterol (SYMBICORT) 160-4.5 MCG/ACT inhaler Inhale 2 puffs into the lungs 2 (two) times daily.   02/18/2018 at morning  . diphenhydrAMINE (BENADRYL) 25 mg capsule Take 25 mg by mouth at bedtime as needed for sleep.    02/17/2018 at Unknown time  . DULoxetine (CYMBALTA) 30 MG capsule Take 30 mg by mouth 2 (two) times daily.   02/18/2018 at morning  . EQ MUCUS ER 600 MG 12 hr tablet Take 600 mg by mouth 2 (two) times daily as needed for cough or to loosen phlegm.    02/18/2018 at Unknown time  . ipratropium-albuterol  (DUONEB) 0.5-2.5 (3) MG/3ML SOLN Take 3 mLs by nebulization every 6 (six) hours as needed (shortness of breath). (Patient taking differently: Take 3 mLs by nebulization every 4 (four) hours. ) 360 mL 3 02/18/2018 at Unknown time  . OXYGEN Inhale 3.5-4 L into the lungs continuous.    02/18/2018 at Unknown time  . TRELEGY ELLIPTA 100-62.5-25 MCG/INH AEPB Inhale 1 puff into the lungs every evening.   Not Taking at Unknown time   Scheduled: . acetylcysteine  3 mL Nebulization Q6H  . arformoterol  15 mcg Nebulization BID  . aspirin EC  81 mg Oral Daily  . azithromycin  500 mg Oral Daily  . budesonide (PULMICORT) nebulizer solution  0.5 mg Nebulization BID  . DULoxetine  30 mg Oral BID  . enoxaparin (LOVENOX) injection  40 mg Subcutaneous Q24H  . folic acid  1 mg Oral Daily  . guaiFENesin  600 mg Oral BID  . ipratropium-albuterol  3 mL Nebulization Q6H  . mouth rinse  15 mL Mouth Rinse BID  . methylPREDNISolone (SOLU-MEDROL) injection  60 mg Intravenous Q6H  . multivitamin with minerals  1 tablet Oral Daily  . thiamine  100 mg Oral Daily   Or  . thiamine  100 mg Intravenous Daily   Continuous: . sodium chloride 10 mL/hr at 02/18/18 2339   HGD:[id number] **OR** acetaminophen, diphenhydrAMINE, ipratropium-albuterol, LORazepam, ondansetron **OR** ondansetron (ZOFRAN) IV  Assesment: He has COPD exacerbation and he had very slow response but he is better now.  He has acute on chronic hypoxic respiratory failure and his oxygen requirement is decreasing.  I think he is at maximum hospital benefit at this point Active Problems:   COPD exacerbation ([place])   Tobacco abuse   Chronic respiratory failure with hypoxia ([facility])   Goals of care, counseling/discussion    Plan: From a pulmonary point of view I think he could be discharged today    LOS: 6 days   Alonza Bogus 02/25/2018, 9:10 AM

## 2019-04-07 IMAGING — CT CT CHEST W/ CM
2 of 3 series · 15 of 36 positions shown, 18 images · IV contrast (omnipaque)
Comparison: Radiograph 02/18/2018

CLINICAL DATA: COPD, short of breath

EXAM:
CT CHEST WITH CONTRAST
TECHNIQUE: Multidetector CT imaging of the chest was performed during
intravenous contrast administration.
CONTRAST:  75mL OMNIPAQUE IOHEXOL 300 MG/ML  SOLN

[Series 2: axial st · axial · 0.71mm/px · z∈[+1274,+1564]mm · 12 of 171 slices shown, 15 images]
[im 13/171  mediastinal]
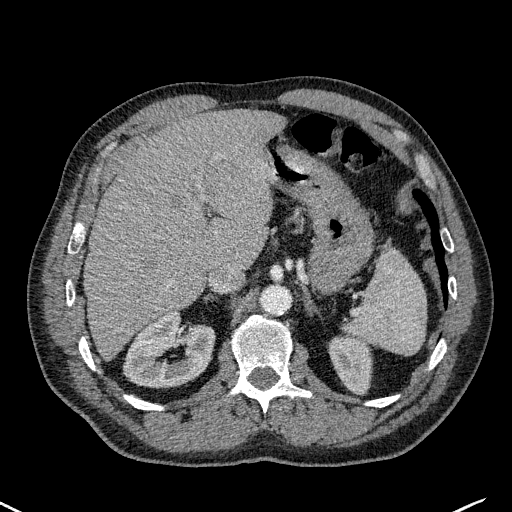
[im 13/171  lung]
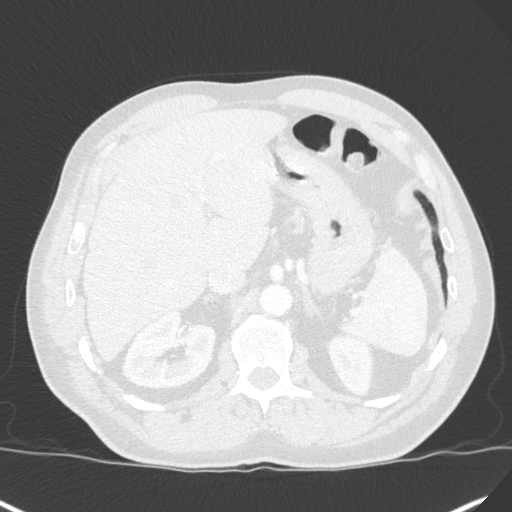
[im 26/171  lung]
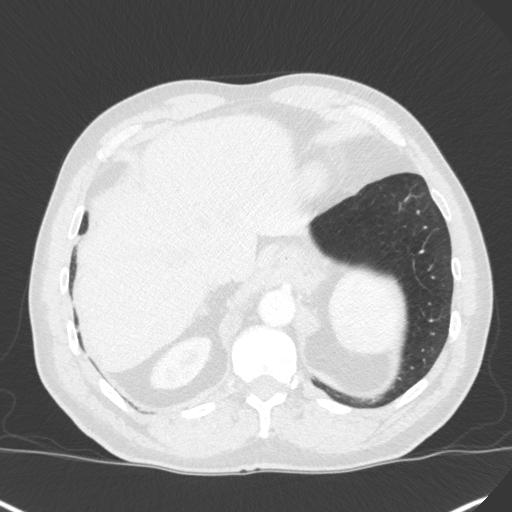
[im 38/171  lung]
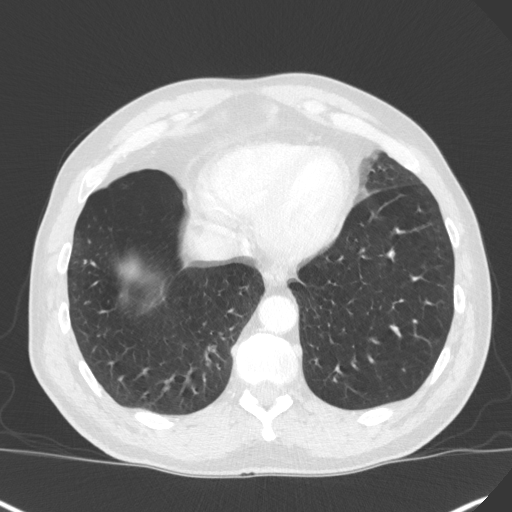
[im 51/171  lung]
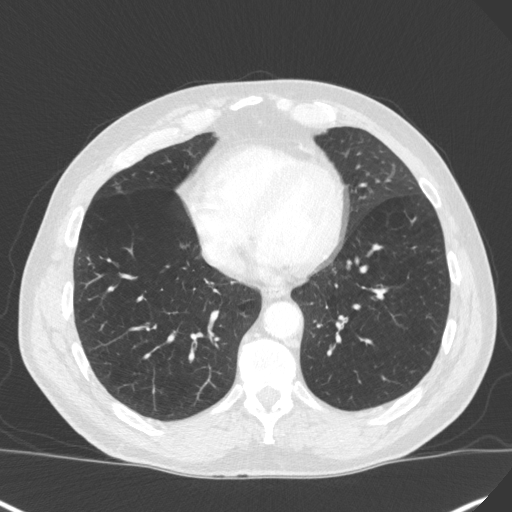
[im 63/171  mediastinal]
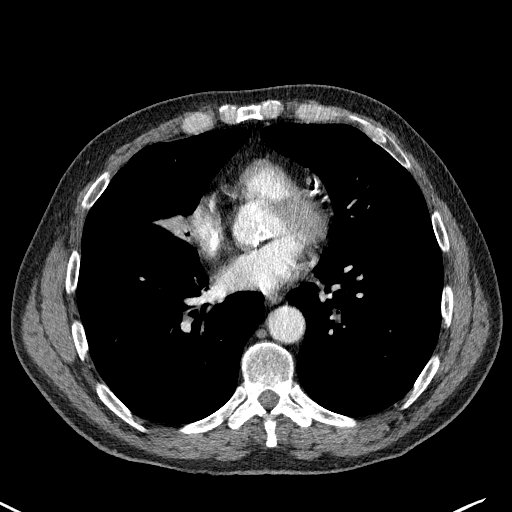
[im 63/171  lung]
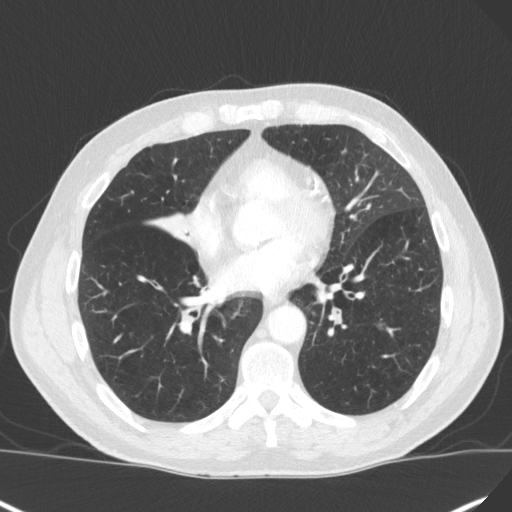
[im 76/171  lung]
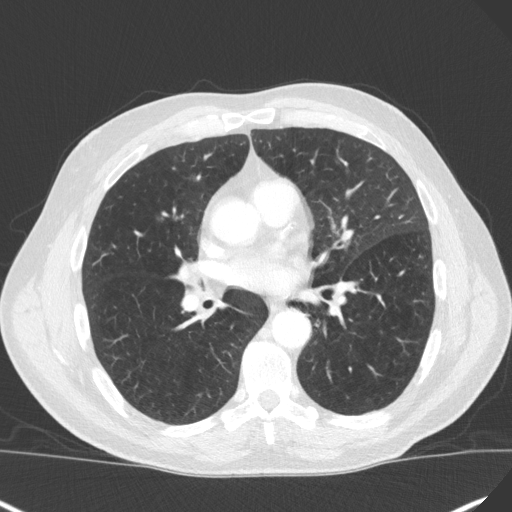
[im 95/171  lung]
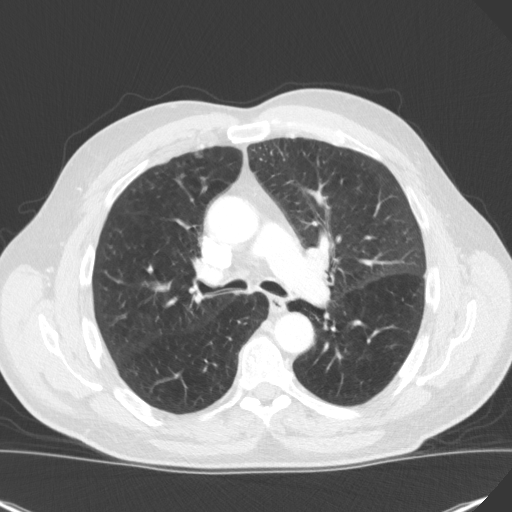
[im 108/171  lung]
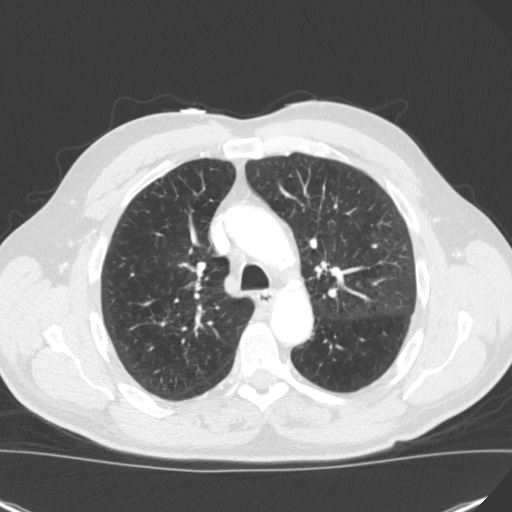
[im 120/171  mediastinal]
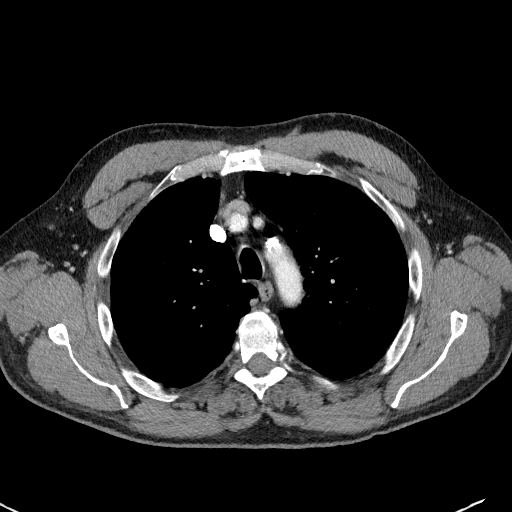
[im 120/171  lung]
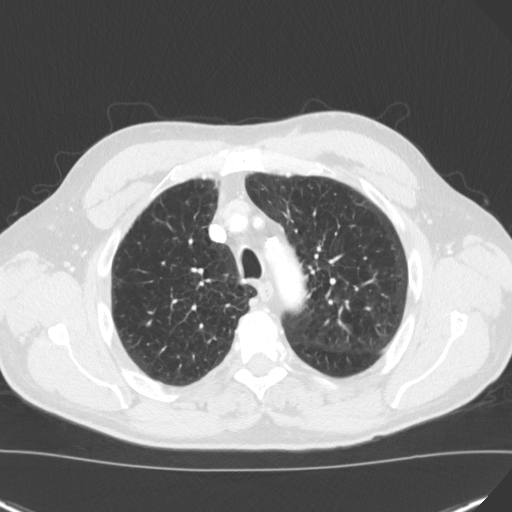
[im 133/171  lung]
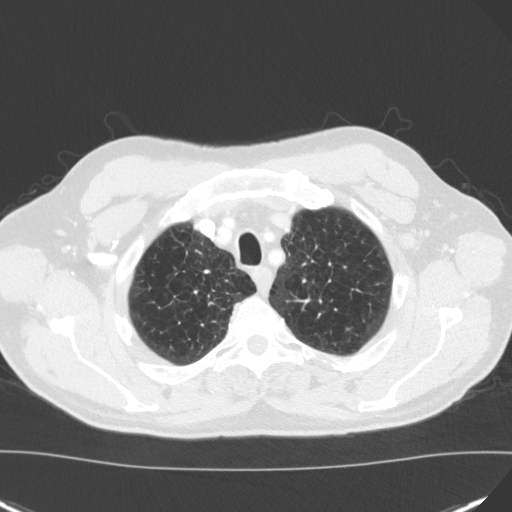
[im 145/171  lung]
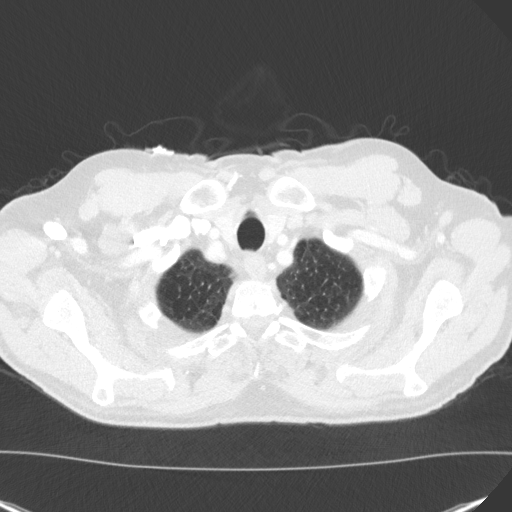
[im 158/171  lung]
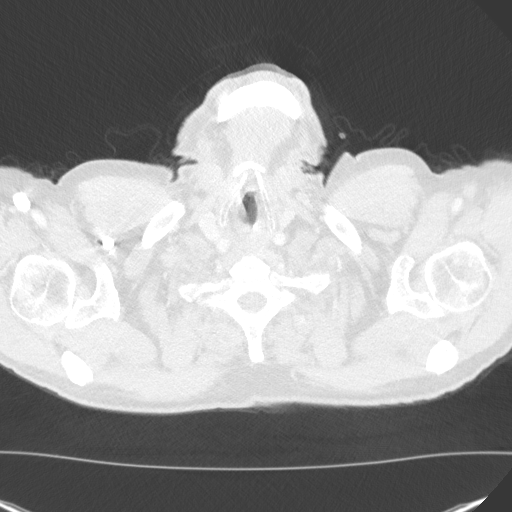

[Series 5: coronal · coronal · 0.67mm/px · 3 of 137 slices shown]
[im 28/137  lung]
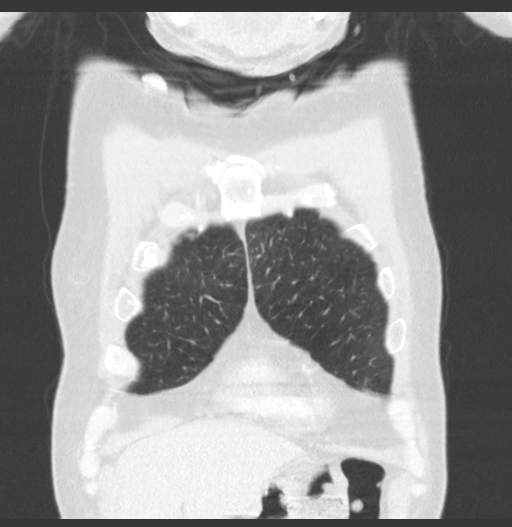
[im 55/137  lung]
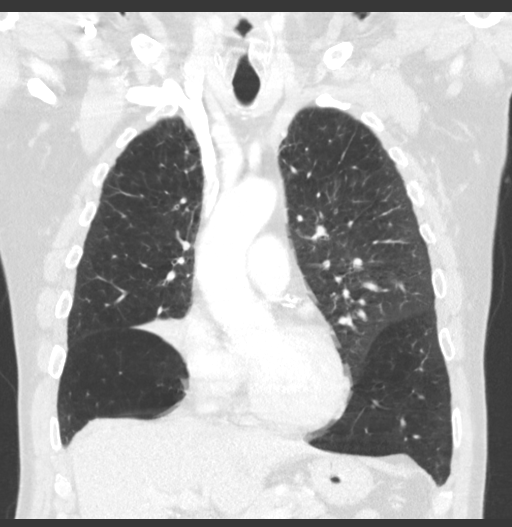
[im 82/137  lung]
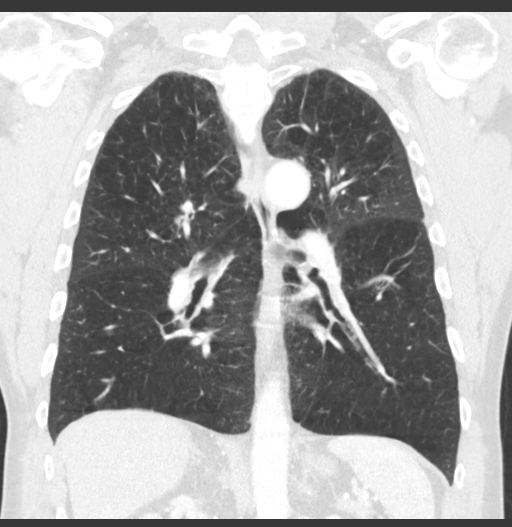

[15 of 36 positions shown; findings below may reference images not displayed]

FINDINGS: Cardiovascular: Three-vessel coronary artery calcification and
aortic atherosclerotic calcification.

Mediastinum/Nodes: No axillary supraclavicular adenopathy. No
mediastinal hilar adenopathy. No pericardial effusion. Esophagus
normal.

Lungs/Pleura: Centrilobular emphysema the upper lobes. No suspicious
nodularity. No bronchiectasis. No architectural distortion.

Upper Abdomen: Limited view of the liver, kidneys, pancreas are
unremarkable. Normal adrenal glands.

Musculoskeletal: No aggressive osseous lesion.
IMPRESSION: 1. Hyperinflated lungs and  centrilobular emphysema.
2. No ground-glass opacities, nodularity, or architectural
distortion.
3. Coronary artery calcification and Aortic Atherosclerosis
([id number]).

## 2020-08-14 ENCOUNTER — Encounter (HOSPITAL_COMMUNITY): Payer: Self-pay | Admitting: Emergency Medicine

## 2020-08-14 ENCOUNTER — Emergency Department (HOSPITAL_COMMUNITY): Payer: Medicare Other

## 2020-08-14 ENCOUNTER — Other Ambulatory Visit: Payer: Self-pay

## 2020-08-14 ENCOUNTER — Inpatient Hospital Stay (HOSPITAL_COMMUNITY)
Admission: EM | Admit: 2020-08-14 | Discharge: 2020-09-08 | DRG: 871 | Disposition: E | Payer: Medicare Other | Attending: Internal Medicine | Admitting: Internal Medicine

## 2020-08-14 ENCOUNTER — Observation Stay (HOSPITAL_COMMUNITY): Payer: Medicare Other

## 2020-08-14 DIAGNOSIS — J9621 Acute and chronic respiratory failure with hypoxia: Secondary | ICD-10-CM | POA: Diagnosis present

## 2020-08-14 DIAGNOSIS — Z8616 Personal history of COVID-19: Secondary | ICD-10-CM | POA: Diagnosis not present

## 2020-08-14 DIAGNOSIS — Z9981 Dependence on supplemental oxygen: Secondary | ICD-10-CM | POA: Diagnosis not present

## 2020-08-14 DIAGNOSIS — Z85118 Personal history of other malignant neoplasm of bronchus and lung: Secondary | ICD-10-CM

## 2020-08-14 DIAGNOSIS — J441 Chronic obstructive pulmonary disease with (acute) exacerbation: Secondary | ICD-10-CM | POA: Diagnosis not present

## 2020-08-14 DIAGNOSIS — F410 Panic disorder [episodic paroxysmal anxiety] without agoraphobia: Secondary | ICD-10-CM | POA: Diagnosis present

## 2020-08-14 DIAGNOSIS — Z66 Do not resuscitate: Secondary | ICD-10-CM | POA: Diagnosis present

## 2020-08-14 DIAGNOSIS — Z7189 Other specified counseling: Secondary | ICD-10-CM | POA: Diagnosis not present

## 2020-08-14 DIAGNOSIS — R918 Other nonspecific abnormal finding of lung field: Secondary | ICD-10-CM | POA: Diagnosis not present

## 2020-08-14 DIAGNOSIS — Z515 Encounter for palliative care: Secondary | ICD-10-CM

## 2020-08-14 DIAGNOSIS — Z79899 Other long term (current) drug therapy: Secondary | ICD-10-CM | POA: Diagnosis not present

## 2020-08-14 DIAGNOSIS — F4310 Post-traumatic stress disorder, unspecified: Secondary | ICD-10-CM | POA: Diagnosis present

## 2020-08-14 DIAGNOSIS — F319 Bipolar disorder, unspecified: Secondary | ICD-10-CM | POA: Diagnosis present

## 2020-08-14 DIAGNOSIS — L8990 Pressure ulcer of unspecified site, unspecified stage: Secondary | ICD-10-CM | POA: Insufficient documentation

## 2020-08-14 DIAGNOSIS — I700 Atherosclerosis of aorta: Secondary | ICD-10-CM | POA: Diagnosis present

## 2020-08-14 DIAGNOSIS — E7800 Pure hypercholesterolemia, unspecified: Secondary | ICD-10-CM | POA: Diagnosis present

## 2020-08-14 DIAGNOSIS — R0602 Shortness of breath: Secondary | ICD-10-CM | POA: Diagnosis present

## 2020-08-14 DIAGNOSIS — E440 Moderate protein-calorie malnutrition: Secondary | ICD-10-CM | POA: Insufficient documentation

## 2020-08-14 DIAGNOSIS — J181 Lobar pneumonia, unspecified organism: Secondary | ICD-10-CM | POA: Diagnosis present

## 2020-08-14 DIAGNOSIS — R6520 Severe sepsis without septic shock: Secondary | ICD-10-CM

## 2020-08-14 DIAGNOSIS — C779 Secondary and unspecified malignant neoplasm of lymph node, unspecified: Secondary | ICD-10-CM | POA: Diagnosis present

## 2020-08-14 DIAGNOSIS — Z8249 Family history of ischemic heart disease and other diseases of the circulatory system: Secondary | ICD-10-CM

## 2020-08-14 DIAGNOSIS — Z7952 Long term (current) use of systemic steroids: Secondary | ICD-10-CM

## 2020-08-14 DIAGNOSIS — C3411 Malignant neoplasm of upper lobe, right bronchus or lung: Secondary | ICD-10-CM | POA: Diagnosis present

## 2020-08-14 DIAGNOSIS — J9601 Acute respiratory failure with hypoxia: Secondary | ICD-10-CM

## 2020-08-14 DIAGNOSIS — Y95 Nosocomial condition: Secondary | ICD-10-CM | POA: Diagnosis present

## 2020-08-14 DIAGNOSIS — J189 Pneumonia, unspecified organism: Secondary | ICD-10-CM | POA: Diagnosis not present

## 2020-08-14 DIAGNOSIS — I471 Supraventricular tachycardia: Secondary | ICD-10-CM | POA: Diagnosis not present

## 2020-08-14 DIAGNOSIS — F17210 Nicotine dependence, cigarettes, uncomplicated: Secondary | ICD-10-CM | POA: Diagnosis present

## 2020-08-14 DIAGNOSIS — D649 Anemia, unspecified: Secondary | ICD-10-CM | POA: Diagnosis present

## 2020-08-14 DIAGNOSIS — J439 Emphysema, unspecified: Secondary | ICD-10-CM | POA: Diagnosis present

## 2020-08-14 DIAGNOSIS — J9622 Acute and chronic respiratory failure with hypercapnia: Secondary | ICD-10-CM | POA: Diagnosis present

## 2020-08-14 DIAGNOSIS — D630 Anemia in neoplastic disease: Secondary | ICD-10-CM | POA: Diagnosis present

## 2020-08-14 DIAGNOSIS — A419 Sepsis, unspecified organism: Principal | ICD-10-CM

## 2020-08-14 DIAGNOSIS — Z7951 Long term (current) use of inhaled steroids: Secondary | ICD-10-CM | POA: Diagnosis not present

## 2020-08-14 DIAGNOSIS — Z7982 Long term (current) use of aspirin: Secondary | ICD-10-CM

## 2020-08-14 DIAGNOSIS — Z6820 Body mass index (BMI) 20.0-20.9, adult: Secondary | ICD-10-CM

## 2020-08-14 HISTORY — DX: Pneumonia, unspecified organism: J18.9

## 2020-08-14 HISTORY — DX: Malignant neoplasm of unspecified part of unspecified bronchus or lung: C34.90

## 2020-08-14 LAB — CBC WITH DIFFERENTIAL/PLATELET
Abs Immature Granulocytes: 0.17 10*3/uL — ABNORMAL HIGH (ref 0.00–0.07)
Basophils Absolute: 0.1 10*3/uL (ref 0.0–0.1)
Basophils Relative: 0 %
Eosinophils Absolute: 0 10*3/uL (ref 0.0–0.5)
Eosinophils Relative: 0 %
HCT: 32 % — ABNORMAL LOW (ref 39.0–52.0)
Hemoglobin: 9.7 g/dL — ABNORMAL LOW (ref 13.0–17.0)
Immature Granulocytes: 1 %
Lymphocytes Relative: 2 %
Lymphs Abs: 0.5 10*3/uL — ABNORMAL LOW (ref 0.7–4.0)
MCH: 26.5 pg (ref 26.0–34.0)
MCHC: 30.3 g/dL (ref 30.0–36.0)
MCV: 87.4 fL (ref 80.0–100.0)
Monocytes Absolute: 1.1 10*3/uL — ABNORMAL HIGH (ref 0.1–1.0)
Monocytes Relative: 4 %
Neutro Abs: 22.1 10*3/uL — ABNORMAL HIGH (ref 1.7–7.7)
Neutrophils Relative %: 93 %
Platelets: 369 10*3/uL (ref 150–400)
RBC: 3.66 MIL/uL — ABNORMAL LOW (ref 4.22–5.81)
RDW: 22.2 % — ABNORMAL HIGH (ref 11.5–15.5)
WBC: 23.9 10*3/uL — ABNORMAL HIGH (ref 4.0–10.5)
nRBC: 0 % (ref 0.0–0.2)

## 2020-08-14 LAB — PROTIME-INR
INR: 1.2 (ref 0.8–1.2)
Prothrombin Time: 14.9 seconds (ref 11.4–15.2)

## 2020-08-14 LAB — URINALYSIS, ROUTINE W REFLEX MICROSCOPIC
Bilirubin Urine: NEGATIVE
Glucose, UA: NEGATIVE mg/dL
Ketones, ur: NEGATIVE mg/dL
Leukocytes,Ua: NEGATIVE
Nitrite: NEGATIVE
Protein, ur: NEGATIVE mg/dL
Specific Gravity, Urine: 1.025 (ref 1.005–1.030)
pH: 6 (ref 5.0–8.0)

## 2020-08-14 LAB — COMPREHENSIVE METABOLIC PANEL
ALT: 14 U/L (ref 0–44)
AST: 14 U/L — ABNORMAL LOW (ref 15–41)
Albumin: 2.6 g/dL — ABNORMAL LOW (ref 3.5–5.0)
Alkaline Phosphatase: 84 U/L (ref 38–126)
Anion gap: 7 (ref 5–15)
BUN: 11 mg/dL (ref 8–23)
CO2: 33 mmol/L — ABNORMAL HIGH (ref 22–32)
Calcium: 9.7 mg/dL (ref 8.9–10.3)
Chloride: 92 mmol/L — ABNORMAL LOW (ref 98–111)
Creatinine, Ser: 0.62 mg/dL (ref 0.61–1.24)
GFR, Estimated: >60 mL/min (ref >60)
Glucose, Bld: 142 mg/dL — ABNORMAL HIGH (ref 70–99)
Potassium: 4.4 mmol/L (ref 3.5–5.1)
Sodium: 132 mmol/L — ABNORMAL LOW (ref 135–145)
Total Bilirubin: 0.7 mg/dL (ref 0.3–1.2)
Total Protein: 7 g/dL (ref 6.5–8.1)

## 2020-08-14 LAB — BLOOD GAS, ARTERIAL
Acid-Base Excess: 7 mmol/L — ABNORMAL HIGH (ref 0.0–2.0)
Bicarbonate: 30 mmol/L — ABNORMAL HIGH (ref 20.0–28.0)
FIO2: 70
O2 Saturation: 99.1 %
Patient temperature: 39.8
pCO2 arterial: 71.2 mmHg (ref 32.0–48.0)
pH, Arterial: 7.3 — ABNORMAL LOW (ref 7.350–7.450)
pO2, Arterial: 208 mmHg — ABNORMAL HIGH (ref 83.0–108.0)

## 2020-08-14 LAB — LACTIC ACID, PLASMA
Lactic Acid, Venous: 1.8 mmol/L (ref 0.5–1.9)
Lactic Acid, Venous: 1.9 mmol/L (ref 0.5–1.9)

## 2020-08-14 LAB — TROPONIN I (HIGH SENSITIVITY)
Troponin I (High Sensitivity): 10 ng/L (ref <18)
Troponin I (High Sensitivity): 10 ng/L (ref <18)

## 2020-08-14 LAB — HIV ANTIBODY (ROUTINE TESTING W REFLEX): HIV Screen 4th Generation wRfx: NONREACTIVE

## 2020-08-14 LAB — RESP PANEL BY RT-PCR (FLU A&B, COVID) ARPGX2
Influenza A by PCR: NEGATIVE
Influenza B by PCR: NEGATIVE
SARS Coronavirus 2 by RT PCR: NEGATIVE

## 2020-08-14 LAB — MRSA NEXT GEN BY PCR, NASAL: MRSA by PCR Next Gen: NOT DETECTED

## 2020-08-14 LAB — APTT: aPTT: 37 seconds — ABNORMAL HIGH (ref 24–36)

## 2020-08-14 LAB — STREP PNEUMONIAE URINARY ANTIGEN: Strep Pneumo Urinary Antigen: NEGATIVE

## 2020-08-14 MED ORDER — IPRATROPIUM BROMIDE 0.02 % IN SOLN
0.5000 mg | Freq: Four times a day (QID) | RESPIRATORY_TRACT | Status: DC
  Administered 2020-08-14: 0.5 mg via RESPIRATORY_TRACT

## 2020-08-14 MED ORDER — ENOXAPARIN SODIUM 40 MG/0.4ML IJ SOSY
40.0000 mg | PREFILLED_SYRINGE | INTRAMUSCULAR | Status: DC
  Administered 2020-08-14 – 2020-08-20 (×7): 40 mg via SUBCUTANEOUS
  Filled 2020-08-14 (×7): qty 0.4

## 2020-08-14 MED ORDER — ALBUTEROL SULFATE (2.5 MG/3ML) 0.083% IN NEBU
INHALATION_SOLUTION | RESPIRATORY_TRACT | Status: AC
  Filled 2020-08-14: qty 3

## 2020-08-14 MED ORDER — PREDNISONE 20 MG PO TABS
40.0000 mg | ORAL_TABLET | Freq: Every day | ORAL | Status: DC
  Administered 2020-08-15: 40 mg via ORAL
  Filled 2020-08-14 (×2): qty 2

## 2020-08-14 MED ORDER — IPRATROPIUM-ALBUTEROL 0.5-2.5 (3) MG/3ML IN SOLN
3.0000 mL | Freq: Four times a day (QID) | RESPIRATORY_TRACT | Status: DC
  Administered 2020-08-14 – 2020-08-21 (×26): 3 mL via RESPIRATORY_TRACT
  Filled 2020-08-14 (×26): qty 3

## 2020-08-14 MED ORDER — HYDROMORPHONE HCL 1 MG/ML IJ SOLN
1.0000 mg | Freq: Once | INTRAMUSCULAR | Status: AC
  Administered 2020-08-14: 1 mg via INTRAVENOUS
  Filled 2020-08-14: qty 1

## 2020-08-14 MED ORDER — CHLORHEXIDINE GLUCONATE CLOTH 2 % EX PADS
6.0000 | MEDICATED_PAD | Freq: Every day | CUTANEOUS | Status: DC
  Administered 2020-08-14 – 2020-08-22 (×8): 6 via TOPICAL

## 2020-08-14 MED ORDER — ACETAMINOPHEN 650 MG RE SUPP
650.0000 mg | Freq: Once | RECTAL | Status: AC
  Administered 2020-08-14: 650 mg via RECTAL
  Filled 2020-08-14: qty 1

## 2020-08-14 MED ORDER — MAGNESIUM SULFATE 2 GM/50ML IV SOLN
2.0000 g | Freq: Once | INTRAVENOUS | Status: AC
  Administered 2020-08-14: 2 g via INTRAVENOUS
  Filled 2020-08-14: qty 50

## 2020-08-14 MED ORDER — ALBUTEROL SULFATE (2.5 MG/3ML) 0.083% IN NEBU
2.5000 mg | INHALATION_SOLUTION | Freq: Four times a day (QID) | RESPIRATORY_TRACT | Status: DC
  Administered 2020-08-14: 2.5 mg via RESPIRATORY_TRACT

## 2020-08-14 MED ORDER — SODIUM CHLORIDE 0.9 % IV SOLN
2.0000 g | Freq: Three times a day (TID) | INTRAVENOUS | Status: DC
  Administered 2020-08-14 – 2020-08-20 (×19): 2 g via INTRAVENOUS
  Filled 2020-08-14 (×19): qty 2

## 2020-08-14 MED ORDER — LACTATED RINGERS IV SOLN: INTRAVENOUS | Status: DC

## 2020-08-14 MED ORDER — OXYCODONE-ACETAMINOPHEN 5-325 MG PO TABS
1.0000 | ORAL_TABLET | Freq: Four times a day (QID) | ORAL | Status: DC
  Administered 2020-08-15 – 2020-08-16 (×5): 1 via ORAL
  Filled 2020-08-14 (×6): qty 1

## 2020-08-14 MED ORDER — ACETAMINOPHEN 325 MG PO TABS
650.0000 mg | ORAL_TABLET | Freq: Four times a day (QID) | ORAL | Status: DC | PRN
  Administered 2020-08-14: 650 mg via ORAL
  Filled 2020-08-14: qty 2

## 2020-08-14 MED ORDER — ONDANSETRON HCL 4 MG/2ML IJ SOLN
4.0000 mg | Freq: Four times a day (QID) | INTRAMUSCULAR | Status: DC | PRN
  Administered 2020-08-14: 4 mg via INTRAVENOUS
  Filled 2020-08-14: qty 2

## 2020-08-14 MED ORDER — VANCOMYCIN HCL IN DEXTROSE 1-5 GM/200ML-% IV SOLN
1000.0000 mg | Freq: Once | INTRAVENOUS | Status: AC
  Administered 2020-08-14: 1000 mg via INTRAVENOUS
  Filled 2020-08-14: qty 200

## 2020-08-14 MED ORDER — IOHEXOL 350 MG/ML SOLN
60.0000 mL | Freq: Once | INTRAVENOUS | Status: AC | PRN
  Administered 2020-08-14: 60 mL via INTRAVENOUS

## 2020-08-14 MED ORDER — IPRATROPIUM BROMIDE 0.02 % IN SOLN
RESPIRATORY_TRACT | Status: AC
  Filled 2020-08-14: qty 2.5

## 2020-08-14 MED ORDER — LACTATED RINGERS IV BOLUS
1000.0000 mL | Freq: Once | INTRAVENOUS | Status: AC
  Administered 2020-08-14: 1000 mL via INTRAVENOUS

## 2020-08-14 MED ORDER — VANCOMYCIN HCL IN DEXTROSE 1-5 GM/200ML-% IV SOLN
1000.0000 mg | Freq: Two times a day (BID) | INTRAVENOUS | Status: DC
  Administered 2020-08-14 – 2020-08-15 (×3): 1000 mg via INTRAVENOUS
  Filled 2020-08-14 (×3): qty 200

## 2020-08-14 MED ORDER — ONDANSETRON HCL 4 MG PO TABS: 4.0000 mg | ORAL_TABLET | Freq: Four times a day (QID) | ORAL | Status: DC | PRN

## 2020-08-14 MED ORDER — ORAL CARE MOUTH RINSE
15.0000 mL | Freq: Two times a day (BID) | OROMUCOSAL | Status: DC
  Administered 2020-08-15 – 2020-08-22 (×14): 15 mL via OROMUCOSAL

## 2020-08-14 MED ORDER — SODIUM CHLORIDE 0.9 % IV BOLUS
1000.0000 mL | Freq: Once | INTRAVENOUS | Status: AC
  Administered 2020-08-14: 1000 mL via INTRAVENOUS

## 2020-08-14 MED ORDER — QUETIAPINE FUMARATE 25 MG PO TABS
25.0000 mg | ORAL_TABLET | Freq: Two times a day (BID) | ORAL | Status: DC
  Administered 2020-08-14 – 2020-08-15 (×3): 25 mg via ORAL
  Filled 2020-08-14 (×3): qty 1

## 2020-08-14 MED ORDER — SODIUM CHLORIDE 0.9 % IV SOLN
500.0000 mg | INTRAVENOUS | Status: AC
  Administered 2020-08-14 – 2020-08-18 (×5): 500 mg via INTRAVENOUS
  Filled 2020-08-14 (×4): qty 500

## 2020-08-14 MED ORDER — LACTATED RINGERS IV BOLUS (SEPSIS)
1000.0000 mL | Freq: Once | INTRAVENOUS | Status: AC
  Administered 2020-08-14: 1000 mL via INTRAVENOUS

## 2020-08-14 MED ORDER — LIDOCAINE 5 % EX PTCH
1.0000 | MEDICATED_PATCH | CUTANEOUS | Status: DC
  Administered 2020-08-14 – 2020-08-21 (×8): 1 via TRANSDERMAL
  Filled 2020-08-14 (×12): qty 1

## 2020-08-14 MED ORDER — SODIUM CHLORIDE 0.9 % IV SOLN: 2.0000 g | INTRAVENOUS | Status: DC

## 2020-08-14 MED ORDER — METHYLPREDNISOLONE SODIUM SUCC 40 MG IJ SOLR
40.0000 mg | Freq: Two times a day (BID) | INTRAMUSCULAR | Status: AC
  Administered 2020-08-14 (×2): 40 mg via INTRAVENOUS
  Filled 2020-08-14 (×2): qty 1

## 2020-08-14 MED ORDER — ACETAMINOPHEN 650 MG RE SUPP: 650.0000 mg | Freq: Four times a day (QID) | RECTAL | Status: DC | PRN

## 2020-08-14 MED ORDER — IPRATROPIUM-ALBUTEROL 0.5-2.5 (3) MG/3ML IN SOLN
3.0000 mL | Freq: Once | RESPIRATORY_TRACT | Status: AC
  Administered 2020-08-14: 3 mL via RESPIRATORY_TRACT
  Filled 2020-08-14: qty 3

## 2020-08-14 MED ORDER — BUSPIRONE HCL 5 MG PO TABS
5.0000 mg | ORAL_TABLET | Freq: Two times a day (BID) | ORAL | Status: DC
  Administered 2020-08-14 – 2020-08-15 (×3): 5 mg via ORAL
  Filled 2020-08-14 (×3): qty 1

## 2020-08-14 MED ORDER — SODIUM CHLORIDE 0.9 % IV SOLN
2.0000 g | Freq: Once | INTRAVENOUS | Status: AC
  Administered 2020-08-14: 2 g via INTRAVENOUS
  Filled 2020-08-14: qty 2

## 2020-08-14 NOTE — ED Notes (Signed)
PT working with patient at this time.

## 2020-08-14 NOTE — Progress Notes (Signed)
[facility] night shift stepdown coverage note.  The nursing staff was able to obtain the patient's met reconsultation with the help of his son and the patient.  Requested home meds were ordered.  Tennis Must, MD.

## 2020-08-14 NOTE — Progress Notes (Signed)
[facility] following for Sepsis Protocol 

## 2020-08-14 NOTE — Progress Notes (Signed)
[place] OF CARE NOTE   08/17/2020 2:04 PM  Robert [name] was seen and examined.  The H&P by the admitting provider, orders, imaging was reviewed.  Please see new orders.  Will continue to follow.   Vitals:   08/26/2020 1200 08/25/2020 1300  BP: 100/60 (!) 118/59  Pulse: 85 90  Resp: (!) 23 20  Temp: 97.9 F (36.6 C) 98.5 F (36.9 C)  SpO2: 94% 94%    Results for orders placed or performed during the hospital encounter of 08/21/2020  Resp Panel by RT-PCR (Flu A&B, Covid) Nasopharyngeal Swab   Specimen: Nasopharyngeal Swab; Nasopharyngeal(NP) swabs in vial transport medium  Result Value Ref Range   SARS Coronavirus 2 by RT PCR NEGATIVE NEGATIVE   Influenza A by PCR NEGATIVE NEGATIVE   Influenza B by PCR NEGATIVE NEGATIVE  Blood Culture (routine x 2)   Specimen: Right Antecubital; Blood  Result Value Ref Range   Specimen Description RIGHT ANTECUBITAL    Special Requests      BOTTLES DRAWN AEROBIC AND ANAEROBIC Blood Culture adequate volume   Culture      NO GROWTH < 12 HOURS Performed at [facility], [address], [place], [place] [address]    Report Status PENDING   Blood Culture (routine x 2)   Specimen: BLOOD RIGHT FOREARM  Result Value Ref Range   Specimen Description BLOOD RIGHT FOREARM    Special Requests      BOTTLES DRAWN AEROBIC AND ANAEROBIC Blood Culture adequate volume   Culture      NO GROWTH < 12 HOURS Performed at [facility], [address] [place], [place] [address]    Report Status PENDING   MRSA Next Gen by PCR, Nasal   Specimen: Nasal Mucosa; Nasal Swab  Result Value Ref Range   MRSA by PCR Next Gen NOT DETECTED NOT DETECTED  Lactic acid, plasma  Result Value Ref Range   Lactic Acid, Venous 1.8 0.5 - 1.9 mmol/L  Lactic acid, plasma  Result Value Ref Range   Lactic Acid, Venous 1.9 0.5 - 1.9 mmol/L  Comprehensive metabolic panel  Result Value Ref Range   Sodium 132 (L) 135 - 145 mmol/L   Potassium 4.4 3.5 - 5.1 mmol/L   Chloride 92 (L) 98  - 111 mmol/L   CO2 33 (H) 22 - 32 mmol/L   Glucose, Bld 142 (H) 70 - 99 mg/dL   BUN 11 8 - 23 mg/dL   Creatinine, Ser 0.62 0.61 - 1.24 mg/dL   Calcium 9.7 8.9 - 10.3 mg/dL   Total Protein 7.0 6.5 - 8.1 g/dL   Albumin 2.6 (L) 3.5 - 5.0 g/dL   AST 14 (L) 15 - 41 U/L   ALT 14 0 - 44 U/L   Alkaline Phosphatase 84 38 - 126 U/L   Total Bilirubin 0.7 0.3 - 1.2 mg/dL   GFR, Estimated >60 >60 mL/min   Anion gap 7 5 - 15  CBC WITH DIFFERENTIAL  Result Value Ref Range   WBC 23.9 (H) 4.0 - 10.5 K/uL   RBC 3.66 (L) 4.22 - 5.81 MIL/uL   Hemoglobin 9.7 (L) 13.0 - 17.0 g/dL   HCT 32.0 (L) 39.0 - 52.0 %   MCV 87.4 80.0 - 100.0 fL   MCH 26.5 26.0 - 34.0 pg   MCHC 30.3 30.0 - 36.0 g/dL   RDW 22.2 (H) 11.5 - 15.5 %   Platelets 369 150 - 400 K/uL   nRBC 0.0 0.0 - 0.2 %  Neutrophils Relative % 93 %   Neutro Abs 22.1 (H) 1.7 - 7.7 K/uL   Lymphocytes Relative 2 %   Lymphs Abs 0.5 (L) 0.7 - 4.0 K/uL   Monocytes Relative 4 %   Monocytes Absolute 1.1 (H) 0.1 - 1.0 K/uL   Eosinophils Relative 0 %   Eosinophils Absolute 0.0 0.0 - 0.5 K/uL   Basophils Relative 0 %   Basophils Absolute 0.1 0.0 - 0.1 K/uL   Immature Granulocytes 1 %   Abs Immature Granulocytes 0.17 (H) 0.00 - 0.07 K/uL  Protime-INR  Result Value Ref Range   Prothrombin Time 14.9 11.4 - 15.2 seconds   INR 1.2 0.8 - 1.2  APTT  Result Value Ref Range   aPTT 37 (H) 24 - 36 seconds  Urinalysis, Routine w reflex microscopic Urine, Catheterized  Result Value Ref Range   Color, Urine YELLOW YELLOW   APPearance CLEAR CLEAR   Specific Gravity, Urine 1.025 1.005 - 1.030   pH 6.0 5.0 - 8.0   Glucose, UA NEGATIVE NEGATIVE mg/dL   Hgb urine dipstick MODERATE (A) NEGATIVE   Bilirubin Urine NEGATIVE NEGATIVE   Ketones, ur NEGATIVE NEGATIVE mg/dL   Protein, ur NEGATIVE NEGATIVE mg/dL   Nitrite NEGATIVE NEGATIVE   Leukocytes,Ua NEGATIVE NEGATIVE   RBC / HPF 0-5 0 - 5 RBC/hpf   WBC, UA 0-5 0 - 5 WBC/hpf   Bacteria, UA FEW (A) NONE SEEN    Mucus PRESENT    Ca Oxalate Crys, UA PRESENT   Blood gas, arterial (at [facility])  Result Value Ref Range   FIO2 70.00    pH, Arterial 7.300 (L) 7.350 - 7.450   pCO2 arterial 71.2 (HH) 32.0 - 48.0 mmHg   pO2, Arterial 208 (H) 83.0 - 108.0 mmHg   Bicarbonate 30.0 (H) 20.0 - 28.0 mmol/L   Acid-Base Excess 7.0 (H) 0.0 - 2.0 mmol/L   O2 Saturation 99.1 %   Patient temperature 39.8    Allens test (pass/fail) PASS PASS  Troponin I (High Sensitivity)  Result Value Ref Range   Troponin I (High Sensitivity) 10 <18 ng/L  Troponin I (High Sensitivity)  Result Value Ref Range   Troponin I (High Sensitivity) 10 <18 ng/L   C. Wynetta Emery, MD [facility]   08/27/2020 12:26 AM How to contact the [facility] Attending or Consulting provider [place] or covering provider during after hours [place], for this patient?  Check the care team in [facility] and look for a) attending/consulting TRH provider listed and b) the [facility] team listed Log into [email or web address] and use [place]'s universal password to access. If you do not have the password, please contact the hospital operator. Locate the [facility] provider you are looking for under [facility] and page to a number that you can be directly reached. If you still have difficulty reaching the provider, please page the [facility] (Director on Call) for the Hospitalists listed on amion for assistance.

## 2020-08-14 NOTE — Progress Notes (Signed)
Patient doing well off BIPAP at this time. Breathing is improved and he is tolerating drinking water. Will continue to monitor.

## 2020-08-14 NOTE — H&P (Signed)
History and Physical    [name] QIH:[id number] DOB: 10-31-1955 DOA: 09/03/2020  PCP: [facility]   Patient coming from: Home.  I have personally briefly reviewed patient's old medical records in [place]  Chief Complaint: Dyspnea.  HPI: Robert [name] is a 65 y.o. male with medical history significant of alcohol admission, anxiety, depression, bipolar disorder, PTSD, panic attacks, COPD, hyperlipidemia, history of pneumonia, history of lung cancer who is brought to the emergency department via EMS due to dyspnea.  He was recently discharged from [facility] [place] due to sepsis secondary to pneumonia in the setting of newly diagnosed lung cancer.  He received 125 mg of Solu-Medrol IV PE, 2 duo nebs and an albuterol neb in route.  He is currently on BiPAP and unable to provide much information.  He denied having any headache, chest, back or abdominal pain at this time.  ED Course: Initial vital signs were temperature 103.6 F, pulse 01/09/1932, respirations 37, BP 116/50 mmHg and O2 sat 98% on NRB oxygen.  The patient was going to be intubated by Dr. Wyvonnia Dusky but he declined and wants to be DNR.  This was confirmed to me while I was examining him.  He did receive acetaminophen 650 mg rectally, 3000 mL of IV fluid bolus, magnesium sulfate 2 g IVPB, no other DuoNeb, cefepime and vancomycin.  Lab work: His ABG showed an pH of 7.30 with a PCO2 of 71.2 and PO2 of 208 mmHg on an FiO2 of 70%.  Bicarbonate was 30.0 and acid-base as a 7.0 mmol/L.  CBC showed a white count of 23.9, hemoglobin 9.7 g/dL and platelets 369.  Lactic acid was normal twice.  Troponin was normal twice as well.  CMP showed a sodium of 132, chloride of 92 and CO2 of 33 mmol/L.  Glucose 142 mg/dL.  Renal function, potassium and all LFTs, except for an albumin of 2.6 g/dL were within expected range.  Corrected calcium was 10.8 mg/dL.  Imaging: A portable 1 view chest radiograph showed complete  opacification of the right upper lobe.  CT chest suggested.  CT chest with contrast showed a heterogeneously enhancing mass lesion in the right upper lobe with some central hypoattenuation, possibly necrotic change.  He also has not met he has not adenopathy.  Aortic atherosclerosis.  Emphysema.  Please see images and full radiology report for further detail.  Review of Systems: As per HPI otherwise all other systems reviewed and are negative.  Past Medical History:  Diagnosis Date   Alcohol addiction ([place])    Anxiety    Bipolar 1 disorder (HCC)    COPD (chronic obstructive pulmonary disease) (HCC)    Depression    High cholesterol    Lung cancer (HCC)    Panic    Pneumonia    PTSD (post-traumatic stress disorder)     Past Surgical History:  Procedure Laterality Date   TONSILLECTOMY      Social History  reports that he has been smoking cigarettes. He has a 22.50 pack-year smoking history. He has never used smokeless tobacco. He reports that he does not drink alcohol and does not use drugs.  No Known Allergies  Family History  Problem Relation Age of Onset   Coronary artery disease Mother    Coronary artery disease Father    Prior to Admission medications   Medication Sig Start Date End Date Taking? Authorizing Provider  albuterol (PROVENTIL HFA;VENTOLIN HFA) 108 (90 BASE) MCG/ACT inhaler Inhale 2  puffs into the lungs every 4 (four) hours as needed for wheezing or shortness of breath. 09/16/11 02/18/18  Jola Schmidt, MD  aspirin EC 81 MG tablet Take 1 tablet (81 mg total) by mouth daily. 02/17/16   Isaac Bliss, Rayford Halsted, MD  diphenhydrAMINE (BENADRYL) 25 mg capsule Take 25 mg by mouth at bedtime as needed for sleep.     [provider]  DULoxetine (CYMBALTA) 30 MG capsule Take 30 mg by mouth 2 (two) times daily. 01/28/18   [provider]  EQ MUCUS ER 600 MG 12 hr tablet Take 600 mg by mouth 2 (two) times daily as needed for cough or to loosen phlegm.  11/08/17    [provider]  Fluticasone-Salmeterol (ADVAIR DISKUS) 250-50 MCG/DOSE AEPB Inhale 1 puff into the lungs 2 (two) times daily. 02/25/18   Orson Eva, MD  ipratropium-albuterol (DUONEB) 0.5-2.5 (3) MG/3ML SOLN Take 3 mLs by nebulization every 6 (six) hours as needed (shortness of breath). Patient taking differently: Take 3 mLs by nebulization every 4 (four) hours.  09/21/17   [name], MD  OXYGEN Inhale 3.5-4 L into the lungs continuous.     [provider]  predniSONE (DELTASONE) 10 MG tablet Take 6 tablets (60 mg total) by mouth daily with breakfast. And decrease by 1 tablet every 2 days 02/26/18   Orson Eva, MD    Physical Exam: Vitals:   08/17/2020 0145 08/22/2020 0200 08/17/2020 0215 08/26/2020 0222  BP: (!) 88/59 (!) 88/58 (!) 94/57   Pulse: (!) 115 (!) 113 (!) 110   Resp: (!) 25 (!) 25 (!) 27   Temp: (!) 101.3 F (38.5 C) (!) 101.1 F (38.4 C) (!) 100.6 F (38.1 C)   TempSrc:      SpO2: 97% 97% 97% 97%  Weight:      Height:        Constitutional: Chronically ill-appearing. Eyes: PERRL, lids and conjunctivae normal.  Injected sclera. ENMT: BiPAP mask on.  Mucous membranes are dry.  Posterior pharynx clear of any exudate or lesions. Neck: normal, supple, no masses, no thyromegaly Respiratory: On BiPAP ventilation.  Mild bilateral rhonchi and wheezing.  No accessory muscle use.  Cardiovascular: Sinus tachycardia and 100s and 110s, no murmurs / rubs / gallops. 2+ pedal pulses. No carotid bruits.  Abdomen: No distention.  Bowel sounds positive.  Soft, no tenderness, no masses palpated. No hepatosplenomegaly. Musculoskeletal: no clubbing / cyanosis. Good ROM, no contractures. Normal muscle tone.  Skin: no rashes, lesions, ulcers on very limited multilocular examination. Neurologic: CN 2-12 grossly intact. Sensation intact, DTR normal. Strength 5/5 in all 4.  Psychiatric: Normal judgment and insight. Alert and oriented x 3. Normal mood.   Labs on Admission: I have  personally reviewed following labs and imaging studies  CBC: Recent Labs  Lab 08/24/2020 0033  WBC 23.9*  NEUTROABS 22.1*  HGB 9.7*  HCT 32.0*  MCV 87.4  PLT 357   Basic Metabolic Panel: Recent Labs  Lab 08/27/2020 0033  NA 132*  K 4.4  CL 92*  CO2 33*  GLUCOSE 142*  BUN 11  CREATININE 0.62  CALCIUM 9.7   GFR: Estimated Creatinine Clearance: 81.6 mL/min (by C-G formula based on SCr of 0.62 mg/dL).  Liver Function Tests: Recent Labs  Lab 09/02/2020 0033  AST 14*  ALT 14  ALKPHOS 84  BILITOT 0.7  PROT 7.0  ALBUMIN 2.6*   Radiological Exams on Admission: CT CHEST W CONTRAST  Result Date: 08/24/2020 CLINICAL DATA:  Possible  sepsis, recent diagnosis of lung cancer EXAM: CT CHEST WITH CONTRAST TECHNIQUE: Multidetector CT imaging of the chest was performed during intravenous contrast administration. CONTRAST:  66m OMNIPAQUE IOHEXOL 350 MG/ML SOLN COMPARISON:  Radiograph 08/18/2020, CT 02/22/2018 FINDINGS: Cardiovascular: Normal cardiac size. Three-vessel coronary artery atherosclerosis. Dense calcification in the aortic leaflets. No pericardial effusion. Atherosclerotic plaque within the normal caliber aorta. No acute aortic abnormality is evident. Normal 3 vessel branching of the mildly calcified proximal great vessels without acute vascular abnormality. Central pulmonary arteries are normal caliber. Narrowing of the hilar airways and vessels entering the right upper lobe in the region of the large right upper lobe mass. Mediastinum/Nodes: The right hilar structures are closely abutted and partially obscured by large heterogeneous mass lesion which appears predominantly centered in the right upper lobe. Notable narrowing and occlusion of the right upper lobe airways and vessels. Mediastinal adenopathy is seen including a 14 mm precarinal node (2/61) and a 10 mm subcarinal node (2/74). No demonstrable left hilar adenopathy or concerning axillary adenopathy. No acute abnormality more  central trachea or thoracic esophagus. Thyroid gland and thoracic inlet are unremarkable. Lungs/Pleura: Large heterogeneous enhancing lesion centered in the right upper lobe measuring 10.8 x 9.4 x 10.8 cm in size with more hypoattenuating central regions which could reflect some central necrosis. This distorts and occludes the closely approximated high right hilar vessels and airways with essentially complete occlusion of the right upper lobe bronchus and extensive surrounding airless lung. Additional areas of projection into both the posterior right middle lobe (5/38 and right lower lobe (6/34). Regions of airless lung abutting this mass lesion demonstrate heterogeneous parenchymal enhancement which could reflect postobstructive pneumonia or direct extension of the mass lesion, difficult to fully ascertain. Airways thickening as well as basilar predominant interstitial opacities and basilar density in both lower lobes are more nonspecific. Background of centrilobular and paraseptal emphysematous changes. Upper Abdomen: Periportal edematous changes, nonspecific. No visible focal liver lesion within the limitations of this exam. No other acute or suspicious upper abdominal abnormalities. Musculoskeletal: No suspicious lytic or blastic lesions. Multilevel degenerative changes are present in the imaged portions of the spine. IMPRESSION: Heterogeneously enhancing mass lesion seen in the right upper lobe with some central hypoattenuation, possibly necrotic change. Closely approximating the right hilar structures with narrowing of the airways and vessels in this vicinity. Consistent with patient's reported primary lung malignancy. Heterogeneous enhancement of the surrounding airless lung parenchyma the left upper lobe, can reflect some direct extension and or postobstructive pneumonia particularly clinical features of sepsis. Additional airways thickening, interstitial opacity and basilar consolidation, more nonspecific  and could reflect infection, edema versus lymphangitic spread. Mediastinal adenopathy, concerning for metastatic disease. Aortic Atherosclerosis (ICD10-I70.0). Emphysema (ICD10-J43.9). These results were called by telephone at the time of interpretation on 09/04/2020 at 4:02 am to provider Dollie Mayse , who verbally acknowledged these results. Electronically Signed   By: PLovena LeM.D.   On: 08/18/2020 04:02   DG Chest Port 1 View  Result Date: 08/29/2020 CLINICAL DATA:  Questionable sepsis EXAM: PORTABLE CHEST 1 VIEW COMPARISON:  02/18/2018 FINDINGS: Complete opacification of the right upper lung/lobe. This could reflect infiltrate or mass. No confluent opacity on the left. No visible effusions. Mild hyperinflation/COPD. Heart is normal size. IMPRESSION: Complete opacification of the right upper lobe. This could reflect pneumonia or mass. Postobstructive process cannot be excluded. Consider further evaluation with chest CT with IV contrast. Electronically Signed   By: KRolm BaptiseM.D.   On: 08/21/2020 01:19  EKG: Independently reviewed.  Vent. rate 134 BPM PR interval 154 ms QRS duration 86 ms QT/QTcB 296/442 ms P-R-T axes 71 76 71 Sinus tachycardia Ventricular premature complex Consider right atrial enlargement  Assessment/Plan Principal Problem:   Acute on chronic respiratory failure with hypoxia and hypercapnia (HCC) In the setting of:     COPD exacerbation ([place]) Secondary to:   Right upper lobe consolidation ([place]) With central necrotic change due to:   Newly diagnosed RUL lung cancer Suspicious for necrotic change. Continue BiPAP/supplemental oxygen. Bronchodilators as needed. Continue Solu-Medrol followed by prednisone. Continue cefepime per pharmacy. Continue vancomycin per pharmacy. Begin azithromycin 500 mg IVPB every 24 hours. Follow-up blood culture and sensitivity. Follow-up CBC and CMP. Oncology work-up once more stable.  Active Problems:   Bipolar 1 disorder  () Resume home meds once med reconciliation done.    Normocytic anemia Monitor H&H. Transfuse as needed.    DVT prophylaxis: Lovenox SQ. Code Status:   DNR/DNI. Family Communication:   Disposition Plan:   Patient is from:  Home.  Anticipated DC to:  Home.  Anticipated DC date:  08/16/2020.  Anticipated DC barriers: Clinical status. Consults called:   Admission status:  Observation/stepdown.  Severity of Illness:  High severity after presenting with acute respiratory failure with hypoxia in the setting of right upper lobe pneumonia and COPD exacerbation.  The patient will need to remain in the hospital for further work-up and treatment.  Reubin Milan MD [facility]  How to contact the [facility] Attending or Consulting provider [place] or covering provider during after hours [place], for this patient?   Check the care team in [facility] and look for a) attending/consulting TRH provider listed and b) the [facility] team listed Log into [email or web address] and use [place]'s universal password to access. If you do not have the password, please contact the hospital operator. Locate the [facility] provider you are looking for under [facility] and page to a number that you can be directly reached. If you still have difficulty reaching the provider, please page the [facility] (Director on Call) for the Hospitalists listed on amion for assistance.  09/02/2020, 4:36 AM   This document was prepared using Dragon voice recognition software and may contain some unintended transcription errors.

## 2020-08-14 NOTE — Progress Notes (Signed)
Pharmacy Antibiotic Note  Robert [name] is a 65 y.o. male admitted on 08/19/2020 with pneumonia.  Pharmacy has been consulted for vancomycin and cefepime dosing. Vancomycin 1gm and cefepime 2gm ordered in ED  Plan: Continue cefepime 2gm IV q8 Cont vancomycin 1gm IV q12 F/u renal function, cultures and clinical course  Height: 5\' 8"  (172.7 cm) Weight: 62.7 kg (138 lb 3.2 oz) IBW/kg (Calculated) : 68.4  Temp (24hrs), Avg:103.6 F (39.8 C), Min:103.6 F (39.8 C), Max:103.6 F (39.8 C)  Recent Labs  Lab 08/15/2020 0033  WBC 23.9*  CREATININE 0.62  LATICACIDVEN 1.8    Estimated Creatinine Clearance: 81.6 mL/min (by C-G formula based on SCr of 0.62 mg/dL).    No Known Allergies  Thank you for allowing pharmacy to be a part of this patient's care.  Excell Seltzer [place] 08/28/2020 1:35 AM

## 2020-08-14 NOTE — ED Provider Notes (Signed)
[facility] EMERGENCY DEPARTMENT Provider Note   CSN: [id number] Arrival date & time: 08/17/2020  0025     History Chief Complaint  Patient presents with   Shortness of Breath    Robert [name] is a 65 y.o. male.  Level 5 caveat for acuity of condition.  Patient brought in by EMS with shortness of breath and hypoxia.  He has a history of COPD and recently diagnosed lung cancer.  He was found to be saturating 60% on his home 3 L.  EMS reports he was recently hospitalized outside hospital about 1 week ago with sepsis from pneumonia and a new diagnosis of lung cancer.  EMS was called on him 3 days ago and he refused transport.  He was found to have a fever tonight with hypoxia and tachycardia with increased work of breathing.  They said he was cyanotic centrally.  They placed him on nonrebreather And gave Solu-Medrol lactated Ringer's.  He also gave him some duo nebs.  Patient denies any chest pain or leg swelling. He is oriented to person and place.  He states that he would not want to be placed on life support.  The history is provided by the patient and the EMS personnel. The history is limited by the condition of the patient.  Shortness of Breath     Past Medical History:  Diagnosis Date   Alcohol addiction ([place])    Anxiety    Bipolar 1 disorder (HCC)    COPD (chronic obstructive pulmonary disease) (HCC)    Depression    High cholesterol    Lung cancer (HCC)    Panic    Pneumonia    PTSD (post-traumatic stress disorder)     Patient Active Problem List   Diagnosis Date Noted   Goals of care, counseling/discussion 02/22/2018   Chronic respiratory failure with hypoxia ([place]) 02/19/2018   Acute on chronic respiratory failure with hypoxia and hypercapnia (HCC)    Delirium tremens ([place]) 04/18/2017   Acute on chronic respiratory failure with hypoxia ([place]) 04/17/2017   Tobacco abuse 04/17/2017   TIA (transient ischemic attack) 02/16/2016   COPD exacerbation ([place]) 10/31/2015    COPD (chronic obstructive pulmonary disease) (HCC)    High cholesterol    PTSD (post-traumatic stress disorder)    Alcohol addiction ([place])    Bipolar 1 disorder ([place])     Past Surgical History:  Procedure Laterality Date   TONSILLECTOMY         Family History  Problem Relation Age of Onset   Coronary artery disease Mother    Coronary artery disease Father     Social History   Tobacco Use   Smoking status: Some Days    Packs/day: 0.50    Years: 45.00    Pack years: 22.50    Types: Cigarettes   Smokeless tobacco: Never  Vaping Use   Vaping Use: Never used  Substance Use Topics   Alcohol use: No   Drug use: No    Home Medications Prior to Admission medications   Medication Sig Start Date End Date Taking? Authorizing Provider  albuterol (PROVENTIL HFA;VENTOLIN HFA) 108 (90 BASE) MCG/ACT inhaler Inhale 2 puffs into the lungs every 4 (four) hours as needed for wheezing or shortness of breath. 09/16/11 02/18/18  Jola Schmidt, MD  aspirin EC 81 MG tablet Take 1 tablet (81 mg total) by mouth daily. 02/17/16   Isaac Bliss, Rayford Halsted, MD  diphenhydrAMINE (BENADRYL) 25 mg capsule Take 25 mg by mouth at bedtime  as needed for sleep.     [provider]  DULoxetine (CYMBALTA) 30 MG capsule Take 30 mg by mouth 2 (two) times daily. 01/28/18   [provider]  EQ MUCUS ER 600 MG 12 hr tablet Take 600 mg by mouth 2 (two) times daily as needed for cough or to loosen phlegm.  11/08/17   [provider]  Fluticasone-Salmeterol (ADVAIR DISKUS) 250-50 MCG/DOSE AEPB Inhale 1 puff into the lungs 2 (two) times daily. 02/25/18   Orson Eva, MD  ipratropium-albuterol (DUONEB) 0.5-2.5 (3) MG/3ML SOLN Take 3 mLs by nebulization every 6 (six) hours as needed (shortness of breath). Patient taking differently: Take 3 mLs by nebulization every 4 (four) hours.  09/21/17   [name], MD  OXYGEN Inhale 3.5-4 L into the lungs continuous.     [provider]  predniSONE  (DELTASONE) 10 MG tablet Take 6 tablets (60 mg total) by mouth daily with breakfast. And decrease by 1 tablet every 2 days 02/26/18   Orson Eva, MD    Allergies    Patient has no known allergies.  Review of Systems   Review of Systems  Unable to perform ROS: Severe respiratory distress  Respiratory:  Positive for shortness of breath.    Physical Exam Updated Vital Signs BP (!) 116/50   Pulse (!) 133   Temp (!) 103.6 F (39.8 C) (Rectal)   Resp (!) 37   Ht 5\' 8"  (1.727 m)   Wt 62.7 kg   SpO2 98%   BMI 21.01 kg/m   Physical Exam Vitals and nursing note reviewed.  Constitutional:      General: He is in acute distress.     Appearance: He is well-developed. He is ill-appearing.     Comments: Respiratory distress with tachypnea to the 40s.  Retractions and nasal flaring is bilaterally  HENT:     Head: Normocephalic and atraumatic.     Mouth/Throat:     Pharynx: No oropharyngeal exudate.  Eyes:     Conjunctiva/sclera: Conjunctivae normal.     Pupils: Pupils are equal, round, and reactive to light.  Neck:     Comments: No meningismus. Cardiovascular:     Rate and Rhythm: Regular rhythm. Tachycardia present.     Heart sounds: Normal heart sounds. No murmur heard. Pulmonary:     Effort: Respiratory distress present.     Breath sounds: Wheezing and rhonchi present.     Comments: Severe respiratory distress with tachypnea to the 40s, diminished breath sounds with wheezing and rhonchi through Abdominal:     Palpations: Abdomen is soft.     Tenderness: There is no abdominal tenderness. There is no guarding or rebound.  Musculoskeletal:        General: No tenderness. Normal range of motion.     Cervical back: Normal range of motion and neck supple.  Skin:    General: Skin is warm.  Neurological:     Mental Status: He is alert.     Cranial Nerves: No cranial nerve deficit.     Motor: No abnormal muscle tone.     Coordination: Coordination normal.     Comments: Oriented to  person and place, moves all extremities and follows commands  Psychiatric:        Behavior: Behavior normal.    ED Results / Procedures / Treatments   Labs (all labs ordered are listed, but only abnormal results are displayed) Labs Reviewed  COMPREHENSIVE METABOLIC PANEL - Abnormal; Notable for the following components:  Result Value   Sodium 132 (*)    Chloride 92 (*)    CO2 33 (*)    Glucose, Bld 142 (*)    Albumin 2.6 (*)    AST 14 (*)    All other components within normal limits  CBC WITH DIFFERENTIAL/PLATELET - Abnormal; Notable for the following components:   WBC 23.9 (*)    RBC 3.66 (*)    Hemoglobin 9.7 (*)    HCT 32.0 (*)    RDW 22.2 (*)    Neutro Abs 22.1 (*)    Lymphs Abs 0.5 (*)    Monocytes Absolute 1.1 (*)    Abs Immature Granulocytes 0.17 (*)    All other components within normal limits  APTT - Abnormal; Notable for the following components:   aPTT 37 (*)    All other components within normal limits  BLOOD GAS, ARTERIAL - Abnormal; Notable for the following components:   pH, Arterial 7.300 (*)    pCO2 arterial 71.2 (*)    pO2, Arterial 208 (*)    Bicarbonate 30.0 (*)    Acid-Base Excess 7.0 (*)    All other components within normal limits  RESP PANEL BY RT-PCR (FLU A&B, COVID) ARPGX2  CULTURE, BLOOD (ROUTINE X 2)  CULTURE, BLOOD (ROUTINE X 2)  EXPECTORATED SPUTUM ASSESSMENT W GRAM STAIN, RFLX TO RESP C  MRSA NEXT GEN BY PCR, NASAL  LACTIC ACID, PLASMA  LACTIC ACID, PLASMA  PROTIME-INR  URINALYSIS, ROUTINE W REFLEX MICROSCOPIC  HIV ANTIBODY (ROUTINE TESTING W REFLEX)  STREP PNEUMONIAE URINARY ANTIGEN  BLOOD GAS, ARTERIAL  TROPONIN I (HIGH SENSITIVITY)  TROPONIN I (HIGH SENSITIVITY)    EKG EKG Interpretation  Date/Time:  Sunday August 14 2020 00:29:43 EDT Ventricular Rate:  134 PR Interval:  154 QRS Duration: 86 QT Interval:  296 QTC Calculation: 442 R Axis:   76 Text Interpretation: Sinus tachycardia Ventricular premature complex  Consider right atrial enlargement Rate faster Confirmed by Ezequiel Essex [phone]) on 08/26/2020 12:48:08 AM  Radiology CT CHEST W CONTRAST  Result Date: 09/02/2020 CLINICAL DATA:  Possible sepsis, recent diagnosis of lung cancer EXAM: CT CHEST WITH CONTRAST TECHNIQUE: Multidetector CT imaging of the chest was performed during intravenous contrast administration. CONTRAST:  58mL OMNIPAQUE IOHEXOL 350 MG/ML SOLN COMPARISON:  Radiograph 08/10/2020, CT 02/22/2018 FINDINGS: Cardiovascular: Normal cardiac size. Three-vessel coronary artery atherosclerosis. Dense calcification in the aortic leaflets. No pericardial effusion. Atherosclerotic plaque within the normal caliber aorta. No acute aortic abnormality is evident. Normal 3 vessel branching of the mildly calcified proximal great vessels without acute vascular abnormality. Central pulmonary arteries are normal caliber. Narrowing of the hilar airways and vessels entering the right upper lobe in the region of the large right upper lobe mass. Mediastinum/Nodes: The right hilar structures are closely abutted and partially obscured by large heterogeneous mass lesion which appears predominantly centered in the right upper lobe. Notable narrowing and occlusion of the right upper lobe airways and vessels. Mediastinal adenopathy is seen including a 14 mm precarinal node (2/61) and a 10 mm subcarinal node (2/74). No demonstrable left hilar adenopathy or concerning axillary adenopathy. No acute abnormality more central trachea or thoracic esophagus. Thyroid gland and thoracic inlet are unremarkable. Lungs/Pleura: Large heterogeneous enhancing lesion centered in the right upper lobe measuring 10.8 x 9.4 x 10.8 cm in size with more hypoattenuating central regions which could reflect some central necrosis. This distorts and occludes the closely approximated high right hilar vessels and airways with essentially complete occlusion of the right  upper lobe bronchus and extensive  surrounding airless lung. Additional areas of projection into both the posterior right middle lobe (5/38 and right lower lobe (6/34). Regions of airless lung abutting this mass lesion demonstrate heterogeneous parenchymal enhancement which could reflect postobstructive pneumonia or direct extension of the mass lesion, difficult to fully ascertain. Airways thickening as well as basilar predominant interstitial opacities and basilar density in both lower lobes are more nonspecific. Background of centrilobular and paraseptal emphysematous changes. Upper Abdomen: Periportal edematous changes, nonspecific. No visible focal liver lesion within the limitations of this exam. No other acute or suspicious upper abdominal abnormalities. Musculoskeletal: No suspicious lytic or blastic lesions. Multilevel degenerative changes are present in the imaged portions of the spine. IMPRESSION: Heterogeneously enhancing mass lesion seen in the right upper lobe with some central hypoattenuation, possibly necrotic change. Closely approximating the right hilar structures with narrowing of the airways and vessels in this vicinity. Consistent with patient's reported primary lung malignancy. Heterogeneous enhancement of the surrounding airless lung parenchyma the left upper lobe, can reflect some direct extension and or postobstructive pneumonia particularly clinical features of sepsis. Additional airways thickening, interstitial opacity and basilar consolidation, more nonspecific and could reflect infection, edema versus lymphangitic spread. Mediastinal adenopathy, concerning for metastatic disease. Aortic Atherosclerosis (ICD10-I70.0). Emphysema (ICD10-J43.9). These results were called by telephone at the time of interpretation on 08/24/2020 at 4:02 am to provider DAVID ORTIZ , who verbally acknowledged these results. Electronically Signed   By: Lovena Le M.D.   On: 08/22/2020 04:02   DG Chest Port 1 View  Result Date:  08/17/2020 CLINICAL DATA:  Questionable sepsis EXAM: PORTABLE CHEST 1 VIEW COMPARISON:  02/18/2018 FINDINGS: Complete opacification of the right upper lung/lobe. This could reflect infiltrate or mass. No confluent opacity on the left. No visible effusions. Mild hyperinflation/COPD. Heart is normal size. IMPRESSION: Complete opacification of the right upper lobe. This could reflect pneumonia or mass. Postobstructive process cannot be excluded. Consider further evaluation with chest CT with IV contrast. Electronically Signed   By: Rolm Baptise M.D.   On: 09/04/2020 01:19    Procedures .Critical Care  Date/Time: 09/05/2020 5:12 AM Performed by: Ezequiel Essex, MD Authorized by: Ezequiel Essex, MD   Critical care provider statement:    Critical care time (minutes):  60   Critical care was necessary to treat or prevent imminent or life-threatening deterioration of the following conditions:  Sepsis and respiratory failure   Critical care was time spent personally by me on the following activities:  Discussions with consultants, evaluation of patient's response to treatment, examination of patient, ordering and performing treatments and interventions, ordering and review of laboratory studies, ordering and review of radiographic studies, pulse oximetry, re-evaluation of patient's condition, obtaining history from patient or surrogate and review of old charts   Medications Ordered in ED Medications  lactated ringers infusion (has no administration in time range)  lactated ringers bolus 1,000 mL (has no administration in time range)  vancomycin (VANCOCIN) IVPB 1000 mg/200 mL premix (has no administration in time range)  ceFEPIme (MAXIPIME) 2 g in sodium chloride 0.9 % 100 mL IVPB (has no administration in time range)  ipratropium-albuterol (DUONEB) 0.5-2.5 (3) MG/3ML nebulizer solution 3 mL (has no administration in time range)  magnesium sulfate IVPB 2 g 50 mL (has no administration in time range)   acetaminophen (TYLENOL) suppository 650 mg (has no administration in time range)  sodium chloride 0.9 % bolus 1,000 mL (has no administration in time range)    ED  Course  I have reviewed the triage vital signs and the nursing notes.  Pertinent labs & imaging results that were available during my care of the patient were reviewed by me and considered in my medical decision making (see chart for details).    MDM Rules/Calculators/A&P                          COPD patient with severe respiratory distress brought in by EMS with hypoxia, fever, tachycardia.  Initial plans for intubation but patient's work of breathing improved on BiPAP and mental status improved.  He states he would not want to be intubated.  Code sepsis activated with IV fluids and broad-spectrum antibiotics given after cultures obtained  Chest x-ray consistent with right upper lobe mass versus infiltrate.  Patient treated for healthcare associated pneumonia and post obstructive.  Work of breathing improved on BiPAP.  ABG shows moderate CO2 retention with respiratory acidosis.  Discussed with patient's son Alvaro by phone.  The patient was recently diagnosed with lung cancer and is due to have a PET scan next week.  No chemotherapy or radiation currently. Patient's son states he has not had a discussion about CODE STATUS with the patient. Patient indicates he would not want to be placed on life support or intubated  Fever and heart rate have improved.  Patient was able to be weaned from BiPAP to nasal cannula.  We will recheck ABG.  Respiratory rate and tachycardia have improved.  he received IV fluids per sepsis protocol as well as broad-spectrum antibiotics.  CT scan will be obtained for further evaluation of right upper lobe opacity.  Patient and son updated.  Patient able to express he does not want to be intubated or placed on life support.  Does not want CPR if his heart were to stop.  He appears to have capacity to  make this decision and understand what he is saying. DNR order placed.  Plan admission for further care for respiratory failure and sepsis.  Discussed with Dr. Olevia Bowens. Final Clinical Impression(s) / ED Diagnoses Final diagnoses:  None    Rx / DC Orders ED Discharge Orders     None        [place] Skalicky, Annie Main, MD 08/26/2020 [phone]

## 2020-08-14 NOTE — Evaluation (Signed)
Physical Therapy Evaluation Patient Details Name: Robert [name] MRN: [id number] DOB: [birth year] Today's Date: 09/02/2020   History of Present Illness  Robert [name] is a 65 y.o. male with medical history significant of alcohol admission, anxiety, depression, bipolar disorder, PTSD, panic attacks, COPD, hyperlipidemia, history of pneumonia, history of lung cancer who is brought to the emergency department via EMS due to dyspnea.  He was recently discharged from [facility], [place] due to sepsis secondary to pneumonia in the setting of newly diagnosed lung cancer.  He received 125 mg of Solu-Medrol IV PE, 2 duo nebs and an albuterol neb in route.  He is currently on BiPAP and unable to provide much information.  He denied having any headache, chest, back or abdominal pain at this time.   Clinical Impression   Patient demonstrates some generalized weakness with main limitation being functional activity tolerance. Increased demand/exertion noted with position changes and with standing/walking requiring frequent cues in pursed-lip breathing to avoid hyperventilation with sats ranging from 94% to 87% with exertion despite the use of supplemental O2 and pt noted with increased anxiety during mobilization. Able to ambulate with slow, steady pattern x 20 ft using RW, again with increased exertion and requiring prompt seated rest periods due to fatigue. Continued services indicated whilst hospitalized to improve functional activity tolerance, and recommend HHPT to improve functional status and activity tolerance once home.     Follow Up Recommendations Home health PT    Equipment Recommendations  None recommended by PT    Recommendations for Other Services       Precautions / Restrictions Restrictions Weight Bearing Restrictions: No      Mobility  Bed Mobility Overal bed mobility: Independent                  Transfers Overall transfer level: Needs assistance Equipment used:  Rolling walker (2 wheeled) Transfers: Sit to/from Stand;Stand Pivot Transfers Sit to Stand: Min guard Stand pivot transfers: Min guard          Ambulation/Gait Ambulation/Gait assistance: Min guard Gait Distance (Feet): 20 Feet Assistive device: Rolling walker (2 wheeled) Gait Pattern/deviations: Step-through pattern Gait velocity: decreased   General Gait Details: increased exertion. Stairs not attempted due to SOB/exertion/anxiety  Stairs            Wheelchair Mobility    Modified Rankin (Stroke Patients Only)       Balance Overall balance assessment: Modified Independent                                           Pertinent Vitals/Pain Pain Assessment: No/denies pain    Home Living Family/patient expects to be discharged to:: Private residence Living Arrangements: Children Available Help at Discharge: Family Type of Home: House Home Access: Stairs to enter Entrance Stairs-Rails: Can reach both Entrance Stairs-Number of Steps: 4 Home Layout: One level Home Equipment: Environmental consultant - 2 wheels;Cane - single point;Wheelchair - manual      Prior Function Level of Independence: Needs assistance   Gait / Transfers Assistance Needed: Independent with household mobility with or without AD  ADL's / Homemaking Assistance Needed: some assist for housekeeping/meal prep        Hand Dominance        Extremity/Trunk Assessment   Upper Extremity Assessment Upper Extremity Assessment: Defer to OT evaluation    Lower Extremity Assessment Lower Extremity Assessment: Generalized  weakness       Communication   Communication: No difficulties  Cognition Arousal/Alertness: Awake/alert Behavior During Therapy: WFL for tasks assessed/performed                                   General Comments: increased SOB and anxiety with exertion      General Comments      Exercises     Assessment/Plan    PT Assessment All further PT  needs can be met in the next venue of care;Patient needs continued PT services (Continued services whilst hospitalized)  PT Problem List Decreased activity tolerance;Cardiopulmonary status limiting activity       PT Treatment Interventions Gait training;Stair training;Functional mobility training;Therapeutic activities;Therapeutic exercise;Patient/family education    PT Goals (Current goals can be found in the Care Plan section)  Acute Rehab PT Goals Patient Stated Goal: "Go home to my people" PT Goal Formulation: With patient Time For Goal Achievement: 08/21/20 Potential to Achieve Goals: Good    Frequency Min 2X/week   Barriers to discharge   pt has supportive family members to assist as needed    Co-evaluation               AM-PAC PT "6 Clicks" Mobility  Outcome Measure Help needed turning from your back to your side while in a flat bed without using bedrails?: None Help needed moving from lying on your back to sitting on the side of a flat bed without using bedrails?: None Help needed moving to and from a bed to a chair (including a wheelchair)?: A Little Help needed standing up from a chair using your arms (e.g., wheelchair or bedside chair)?: A Little Help needed to walk in hospital room?: A Little Help needed climbing 3-5 steps with a railing? : A Lot 6 Click Score: 19    End of Session   Activity Tolerance: Patient limited by fatigue Patient left: in bed;with call bell/phone within reach Nurse Communication: Mobility status PT Visit Diagnosis: Muscle weakness (generalized) (M62.81);Difficulty in walking, not elsewhere classified (R26.2)    Time: 0950-1010 PT Time Calculation (min) (ACUTE ONLY): 20 min   Charges:   PT Evaluation $PT Eval Low Complexity: 1 Low PT Treatments $Gait Training: 8-22 mins       10:49 AM, 09/04/2020 M. Sherlyn Lees, PT, DPT Physical Therapist- [place] Office Number: [phone]

## 2020-08-14 NOTE — Plan of Care (Signed)
  Problem: Acute Rehab PT Goals(only PT should resolve) Goal: Patient Will Transfer Sit To/From Stand Outcome: Progressing Flowsheets (Taken 08/13/2020 1050) Patient will transfer sit to/from stand: with modified independence Goal: Pt Will Transfer Bed To Chair/Chair To Bed Outcome: Progressing Flowsheets (Taken 08/21/2020 1050) Pt will Transfer Bed to Chair/Chair to Bed: with modified independence Goal: Pt Will Ambulate Outcome: Progressing Flowsheets (Taken 08/16/2020 1050) Pt will Ambulate:  50 feet  with modified independence Goal: Pt Will Go Up/Down Stairs Outcome: Progressing Flowsheets (Taken 08/13/2020 1050) Pt will Go Up / Down Stairs:  3-5 stairs  with min guard assist  with rail(s)  10:50 AM, 09/05/2020 M. Sherlyn Lees, PT, DPT Physical Therapist- [place] Office Number: [phone]

## 2020-08-14 NOTE — ED Triage Notes (Signed)
Pt brought in as Emergency Traffic from [place] for dx of sob. EMS gave Solumedrol 125mg  IV and 137ml LR en route. Pt also given 2 duo-nebs as well as 1 albuterol nebulizer. Pt recently discharged from [facility] with dx of sepsis secondary to PNA as well as new dx of lung cancer.

## 2020-08-15 ENCOUNTER — Other Ambulatory Visit: Payer: Self-pay

## 2020-08-15 DIAGNOSIS — J9621 Acute and chronic respiratory failure with hypoxia: Secondary | ICD-10-CM | POA: Diagnosis not present

## 2020-08-15 DIAGNOSIS — F319 Bipolar disorder, unspecified: Secondary | ICD-10-CM | POA: Diagnosis not present

## 2020-08-15 DIAGNOSIS — J441 Chronic obstructive pulmonary disease with (acute) exacerbation: Secondary | ICD-10-CM | POA: Diagnosis not present

## 2020-08-15 DIAGNOSIS — Z515 Encounter for palliative care: Secondary | ICD-10-CM

## 2020-08-15 DIAGNOSIS — J9622 Acute and chronic respiratory failure with hypercapnia: Secondary | ICD-10-CM

## 2020-08-15 DIAGNOSIS — Z85118 Personal history of other malignant neoplasm of bronchus and lung: Secondary | ICD-10-CM

## 2020-08-15 DIAGNOSIS — J181 Lobar pneumonia, unspecified organism: Secondary | ICD-10-CM | POA: Diagnosis not present

## 2020-08-15 LAB — COMPREHENSIVE METABOLIC PANEL
ALT: 13 U/L (ref 0–44)
AST: 13 U/L — ABNORMAL LOW (ref 15–41)
Albumin: 2.1 g/dL — ABNORMAL LOW (ref 3.5–5.0)
Alkaline Phosphatase: 69 U/L (ref 38–126)
Anion gap: 5 (ref 5–15)
BUN: 11 mg/dL (ref 8–23)
CO2: 33 mmol/L — ABNORMAL HIGH (ref 22–32)
Calcium: 9.8 mg/dL (ref 8.9–10.3)
Chloride: 98 mmol/L (ref 98–111)
Creatinine, Ser: 0.45 mg/dL — ABNORMAL LOW (ref 0.61–1.24)
GFR, Estimated: >60 mL/min (ref >60)
Glucose, Bld: 124 mg/dL — ABNORMAL HIGH (ref 70–99)
Potassium: 4.5 mmol/L (ref 3.5–5.1)
Sodium: 136 mmol/L (ref 135–145)
Total Bilirubin: 0.6 mg/dL (ref 0.3–1.2)
Total Protein: 5.7 g/dL — ABNORMAL LOW (ref 6.5–8.1)

## 2020-08-15 LAB — CBC
HCT: 26 % — ABNORMAL LOW (ref 39.0–52.0)
Hemoglobin: 7.7 g/dL — ABNORMAL LOW (ref 13.0–17.0)
MCH: 25.8 pg — ABNORMAL LOW (ref 26.0–34.0)
MCHC: 29.6 g/dL — ABNORMAL LOW (ref 30.0–36.0)
MCV: 87 fL (ref 80.0–100.0)
Platelets: 279 10*3/uL (ref 150–400)
RBC: 2.99 MIL/uL — ABNORMAL LOW (ref 4.22–5.81)
RDW: 22.3 % — ABNORMAL HIGH (ref 11.5–15.5)
WBC: 15.1 10*3/uL — ABNORMAL HIGH (ref 4.0–10.5)
nRBC: 0 % (ref 0.0–0.2)

## 2020-08-15 LAB — MAGNESIUM: Magnesium: 2 mg/dL (ref 1.7–2.4)

## 2020-08-15 MED ORDER — LORAZEPAM 2 MG/ML IJ SOLN
2.0000 mg | Freq: Once | INTRAMUSCULAR | Status: AC
  Administered 2020-08-15: 2 mg via INTRAVENOUS
  Filled 2020-08-15: qty 1

## 2020-08-15 MED ORDER — ENSURE ENLIVE PO LIQD: 237.0000 mL | Freq: Two times a day (BID) | ORAL | Status: DC

## 2020-08-15 MED ORDER — DIPHENHYDRAMINE HCL 25 MG PO CAPS: 25.0000 mg | ORAL_CAPSULE | Freq: Every evening | ORAL | Status: DC | PRN

## 2020-08-15 MED ORDER — DULOXETINE HCL 30 MG PO CPEP
30.0000 mg | ORAL_CAPSULE | Freq: Two times a day (BID) | ORAL | Status: DC
  Administered 2020-08-15: 30 mg via ORAL
  Filled 2020-08-15 (×2): qty 1

## 2020-08-15 MED ORDER — MIRTAZAPINE 15 MG PO TABS
7.5000 mg | ORAL_TABLET | Freq: Every day | ORAL | Status: DC
  Administered 2020-08-15: 7.5 mg via ORAL
  Filled 2020-08-15: qty 1

## 2020-08-15 MED ORDER — GUAIFENESIN ER 600 MG PO TB12
600.0000 mg | ORAL_TABLET | Freq: Two times a day (BID) | ORAL | Status: DC
  Administered 2020-08-15: 600 mg via ORAL
  Filled 2020-08-15: qty 1

## 2020-08-15 MED ORDER — MOMETASONE FURO-FORMOTEROL FUM 200-5 MCG/ACT IN AERO
2.0000 | INHALATION_SPRAY | Freq: Two times a day (BID) | RESPIRATORY_TRACT | Status: DC
  Administered 2020-08-15 – 2020-08-16 (×2): 2 via RESPIRATORY_TRACT
  Filled 2020-08-15: qty 8.8

## 2020-08-15 MED ORDER — FERROUS SULFATE 325 (65 FE) MG PO TABS
325.0000 mg | ORAL_TABLET | Freq: Every day | ORAL | Status: DC
  Administered 2020-08-16: 325 mg via ORAL
  Filled 2020-08-15: qty 1

## 2020-08-15 MED ORDER — ASPIRIN EC 81 MG PO TBEC
81.0000 mg | DELAYED_RELEASE_TABLET | Freq: Every day | ORAL | Status: DC
  Administered 2020-08-15: 81 mg via ORAL
  Filled 2020-08-15: qty 1

## 2020-08-15 NOTE — Progress Notes (Signed)
Palliative-   Thank you for this consult.  Meeting planned for tomorrow morning at [place] with patient's son and daughter.   Mariana Kaufman, AGNP-C Palliative Medicine  No charge

## 2020-08-15 NOTE — Progress Notes (Signed)
Initial Nutrition Assessment  DOCUMENTATION CODES:   Non-severe (moderate) malnutrition in context of chronic illness  INTERVENTION:  Ensure Enlive po BID, (strawberry preferred)  Follow for outcome of goals of care discussion  NUTRITION DIAGNOSIS:   Moderate Malnutrition related to acute illness, chronic illness, decreased appetite (COPD/lung cancer and pneumonia) as evidenced by energy intake < 75% for > or equal to 1 month, moderate fat depletion, moderate muscle depletion, per patient/family report.   GOAL:  Patient will meet greater than or equal to 90% of their needs (if congruent with pt healthcare goals)   MONITOR:  PO intake, Supplement acceptance, Labs, Skin, Weight trends  REASON FOR ASSESSMENT:   Consult Assessment of nutrition requirement/status  ASSESSMENT: Patient is a 65 yo male with hx of ETOH and tobacco addiction, COPD, recently diagnosed lung cancer. Presents with pneumonia. Recent hospitalization due to pneumonia.   Palliative consulted -meeting in the morning with family.   Patient appetite comes and goes. Some days he eats very well per son. His lunch is here untouched. Encouraged pt to try to take a few bites. He is willing to drink oral supplements between meals and prefers strawberry.   Patient weight is 142 lb which is in the range he has maintained recently. He has moderate muscle and fat depletions.   Medications reviewed.  IVF-LR @ 100 ml/hr   Labs: BMP Latest Ref Rng & Units 08/15/2020 08/17/2020 02/25/2018  Glucose 70 - 99 mg/dL 124(H) 142(H) -  BUN 8 - 23 mg/dL 11 11 -  Creatinine 0.61 - 1.24 mg/dL 0.45(L) 0.62 0.70  Sodium 135 - 145 mmol/L 136 132(L) -  Potassium 3.5 - 5.1 mmol/L 4.5 4.4 -  Chloride 98 - 111 mmol/L 98 92(L) -  CO2 22 - 32 mmol/L 33(H) 33(H) -  Calcium 8.9 - 10.3 mg/dL 9.8 9.7 -      NUTRITION - FOCUSED PHYSICAL EXAM:  Nutrition-Focused physical exam completed. Findings are moderate buccal, thoracic fat depletion,  moderate clavicle, deltoid muscle depletion, and no edema.    Diet Order:   Diet Order             Diet Heart Room service appropriate? Yes; Fluid consistency: Thin  Diet effective now                   EDUCATION NEEDS:  Education needs have been addressed  Skin:  Skin Assessment: Reviewed RN Assessment  Last BM:  8/7  Height:   Ht Readings from Last 1 Encounters:  09/07/2020 5\' 8"  (1.727 m)    Weight:   Wt Readings from Last 1 Encounters:  08/15/20 64.8 kg    Ideal Body Weight:   70 kg  BMI:  Body mass index is 21.72 kg/m.  Estimated Nutritional Needs:   Kcal:  2000-2275  Protein:  85-97  Fluid:  2000 ml daily  Colman Cater MS,RD,CSG,LDN Contact: Shea Evans

## 2020-08-15 NOTE — Progress Notes (Signed)
PROGRESS NOTE   Robert [name]  MEQ:[id number] DOB: 05-06-1955 DOA: 08/28/2020 PCP: [facility]   Chief Complaint  Patient presents with   Shortness of Breath   Level of care: Med-Surg  Brief Admission History:  65 y.o. male with medical history significant of alcohol admission, anxiety, depression, bipolar disorder, PTSD, panic attacks, COPD, hyperlipidemia, history of pneumonia, history of lung cancer who is brought to the emergency department via EMS due to dyspnea.  He was recently discharged from [facility], [place] due to sepsis secondary to pneumonia in the setting of newly diagnosed lung cancer.  He received 125 mg of Solu-Medrol IV PE, 2 duo nebs and an albuterol neb in route.  He is currently on BiPAP and unable to provide much information.  He denied having any headache, chest, back or abdominal pain at this time.   ED Course: Initial vital signs were temperature 103.6 F, pulse 01/09/1932, respirations 37, BP 116/50 mmHg and O2 sat 98% on NRB oxygen.  The patient was going to be intubated by Dr. Wyvonnia Dusky but he declined and wants to be DNR.  This was confirmed to me while I was examining him.  He did receive acetaminophen 650 mg rectally, 3000 mL of IV fluid bolus, magnesium sulfate 2 g IVPB, no other DuoNeb, cefepime and vancomycin.  Assessment & Plan:   Principal Problem:   Right upper lobe consolidation (HCC) Active Problems:   Bipolar 1 disorder (HCC)   COPD exacerbation (HCC)   Acute on chronic respiratory failure with hypoxia and hypercapnia (HCC)   Normocytic anemia   History of lung cancer   Aortic atherosclerosis (HCC)   Acute and chronic respiratory failure with hypoxia (HCC)   Acute on chronic respiratory failure with hypoxia and hypercapnia-secondary to COPD exacerbation and right upper lobe consolidation and tumor.   Pt was initially treated with BiPAP therapy and subsequently weaned to nasal cannula oxygen.  He is more comfortable now.   He does not want to escalate care at this point and has requested DNR DNI status.  Continue to treat for pneumonia hospital associated and continue supportive measures.  Tranfer to med surg as no longer requiring bipap.   HCAP - continue cefepime as ordered and supportive measures.  DC vancomycin with negative MRSA screen PCR.   Bipolar disorder -resume home medication.   Newly diagnosed RUL malignancy - Pt was scheduled for outpatient follow up with PET scan and other tests.  I have requested a inpatient palliative medicine consultation for further goals of care discussions.  Anemia in neoplastic disease - Follow Hg, transfuse if <7.    DVT prophylaxis: enoxaparin  Code Status: DNR  Family Communication: son telephone  Disposition: anticipate home with [facility] when medically stable.  Status is: Inpatient  Remains inpatient appropriate because:Inpatient level of care appropriate due to severity of illness  Dispo: The patient is from: Home              Anticipated d/c is to: Home              Patient currently is not medically stable to d/c.   Difficult to place patient No  Consultants:  Palliative care   Procedures:  N/a   Antimicrobials:  Cefepime 8/7>> Vancomycin 8/7-8/8    Subjective: Pt reports being tired and having some SOB but not as bad as when admitted.    Objective: Vitals:   08/15/20 1100 08/15/20 1103 08/15/20 1200 08/15/20 1354  BP: (!) 108/56  122/68   Pulse: 71 72 80   Resp: 15 15 (!) 23   Temp:  97.6 F (36.4 C)    TempSrc:  Oral    SpO2: 93% 93% 95% 100%  Weight:      Height:        Intake/Output Summary (Last 24 hours) at 08/15/2020 1421 Last data filed at 08/15/2020 0429 Gross per 24 hour  Intake 2453.69 ml  Output 1000 ml  Net 1453.69 ml   Filed Weights   08/24/2020 0030 08/22/2020 1243 08/15/20 0400  Weight: 62.7 kg 63.6 kg 64.8 kg    Examination:  General exam: Appears calm and comfortable, he is oriented to person only, NAD. Chronically ill  appearing, appears older than stated age.  Respiratory system: diminished BS LUL, shallow breathing.  Cardiovascular system: normal S1 & S2 heard. No JVD, murmurs, rubs, gallops or clicks. No pedal edema. Gastrointestinal system: Abdomen is nondistended, soft and nontender. No organomegaly or masses felt. Normal bowel sounds heard. Central nervous system: Alert and oriented. No focal neurological deficits. Extremities: Symmetric 5 x 5 power. Skin: No rashes, lesions or ulcers Psychiatry: Judgement and insight appear normal. Mood & affect appropriate.   Data Reviewed: I have personally reviewed following labs and imaging studies  CBC: Recent Labs  Lab 08/13/2020 0033 08/15/20 0447  WBC 23.9* 15.1*  NEUTROABS 22.1*  --   HGB 9.7* 7.7*  HCT 32.0* 26.0*  MCV 87.4 87.0  PLT 369 093    Basic Metabolic Panel: Recent Labs  Lab 09/07/2020 0033 08/15/20 0447  NA 132* 136  K 4.4 4.5  CL 92* 98  CO2 33* 33*  GLUCOSE 142* 124*  BUN 11 11  CREATININE 0.62 0.45*  CALCIUM 9.7 9.8  MG  --  2.0    GFR: Estimated Creatinine Clearance: 84.4 mL/min (A) (by C-G formula based on SCr of 0.45 mg/dL (L)).  Liver Function Tests: Recent Labs  Lab 09/02/2020 0033 08/15/20 0447  AST 14* 13*  ALT 14 13  ALKPHOS 84 69  BILITOT 0.7 0.6  PROT 7.0 5.7*  ALBUMIN 2.6* 2.1*    CBG: No results for input(s): GLUCAP in the last 168 hours.  Recent Results (from the past 240 hour(s))  Blood Culture (routine x 2)     Status: None (Preliminary result)   Collection Time: 08/11/2020 12:28 AM   Specimen: Right Antecubital; Blood  Result Value Ref Range Status   Specimen Description RIGHT ANTECUBITAL  Final   Special Requests   Final    BOTTLES DRAWN AEROBIC AND ANAEROBIC Blood Culture adequate volume   Culture   Final    NO GROWTH < 12 HOURS Performed at [facility], [address], [place], [place] [address]    Report Status PENDING  Incomplete  Blood Culture (routine x 2)     Status: None  (Preliminary result)   Collection Time: 08/25/2020 12:30 AM   Specimen: BLOOD RIGHT FOREARM  Result Value Ref Range Status   Specimen Description BLOOD RIGHT FOREARM  Final   Special Requests   Final    BOTTLES DRAWN AEROBIC AND ANAEROBIC Blood Culture adequate volume   Culture   Final    NO GROWTH < 12 HOURS Performed at [facility], [address] [place], [place] [address]    Report Status PENDING  Incomplete  Resp Panel by RT-PCR (Flu A&B, Covid) Nasopharyngeal Swab     Status: None   Collection Time: 08/08/2020 12:33 AM   Specimen: Nasopharyngeal Swab; Nasopharyngeal(NP) swabs  in vial transport medium  Result Value Ref Range Status   SARS Coronavirus 2 by RT PCR NEGATIVE NEGATIVE Final    Comment: (NOTE) SARS-CoV-2 target nucleic acids are NOT DETECTED.  The SARS-CoV-2 RNA is generally detectable in upper respiratory specimens during the acute phase of infection. The lowest concentration of SARS-CoV-2 viral copies this assay can detect is 138 copies/mL. A negative result does not preclude SARS-Cov-2 infection and should not be used as the sole basis for treatment or other patient management decisions. A negative result may occur with  improper specimen collection/handling, submission of specimen other than nasopharyngeal swab, presence of viral mutation(s) within the areas targeted by this assay, and inadequate number of viral copies(<138 copies/mL). A negative result must be combined with clinical observations, patient history, and epidemiological information. The expected result is Negative.  Fact Sheet for Patients:  EntrepreneurPulse.com.au  Fact Sheet for Healthcare Providers:  IncredibleEmployment.be  This test is no t yet approved or cleared by the [place] FDA and  has been authorized for detection and/or diagnosis of SARS-CoV-2 by FDA under an Emergency Use Authorization (EUA). This EUA will remain  in effect (meaning this  test can be used) for the duration of the COVID-19 declaration under Section 564(b)(1) of the Act, 21 U.S.C.section 360bbb-3(b)(1), unless the authorization is terminated  or revoked sooner.       Influenza A by PCR NEGATIVE NEGATIVE Final   Influenza B by PCR NEGATIVE NEGATIVE Final    Comment: (NOTE) The Xpert Xpress SARS-CoV-2/FLU/RSV plus assay is intended as an aid in the diagnosis of influenza from Nasopharyngeal swab specimens and should not be used as a sole basis for treatment. Nasal washings and aspirates are unacceptable for Xpert Xpress SARS-CoV-2/FLU/RSV testing.  Fact Sheet for Patients: EntrepreneurPulse.com.au  Fact Sheet for Healthcare Providers: IncredibleEmployment.be  This test is not yet approved or cleared by the [place] FDA and has been authorized for detection and/or diagnosis of SARS-CoV-2 by FDA under an Emergency Use Authorization (EUA). This EUA will remain in effect (meaning this test can be used) for the duration of the COVID-19 declaration under Section 564(b)(1) of the Act, 21 U.S.C. section 360bbb-3(b)(1), unless the authorization is terminated or revoked.  Performed at [facility], [address] [place], [place] [address]   MRSA Next Gen by PCR, Nasal     Status: None   Collection Time: 08/25/2020  9:07 AM   Specimen: Nasal Mucosa; Nasal Swab  Result Value Ref Range Status   MRSA by PCR Next Gen NOT DETECTED NOT DETECTED Final    Comment: (NOTE) The GeneXpert MRSA Assay (FDA approved for NASAL specimens only), is one component of a comprehensive MRSA colonization surveillance program. It is not intended to diagnose MRSA infection nor to guide or monitor treatment for MRSA infections. Test performance is not FDA approved in patients less than 53 years old. Performed at [facility], [address] [place], [place] [address]      Radiology Studies: CT CHEST W CONTRAST  Result Date:  09/05/2020 CLINICAL DATA:  Possible sepsis, recent diagnosis of lung cancer EXAM: CT CHEST WITH CONTRAST TECHNIQUE: Multidetector CT imaging of the chest was performed during intravenous contrast administration. CONTRAST:  3mL OMNIPAQUE IOHEXOL 350 MG/ML SOLN COMPARISON:  Radiograph 08/25/2020, CT 02/22/2018 FINDINGS: Cardiovascular: Normal cardiac size. Three-vessel coronary artery atherosclerosis. Dense calcification in the aortic leaflets. No pericardial effusion. Atherosclerotic plaque within the normal caliber aorta. No acute aortic abnormality is evident. Normal 3 vessel branching of the  mildly calcified proximal great vessels without acute vascular abnormality. Central pulmonary arteries are normal caliber. Narrowing of the hilar airways and vessels entering the right upper lobe in the region of the large right upper lobe mass. Mediastinum/Nodes: The right hilar structures are closely abutted and partially obscured by large heterogeneous mass lesion which appears predominantly centered in the right upper lobe. Notable narrowing and occlusion of the right upper lobe airways and vessels. Mediastinal adenopathy is seen including a 14 mm precarinal node (2/61) and a 10 mm subcarinal node (2/74). No demonstrable left hilar adenopathy or concerning axillary adenopathy. No acute abnormality more central trachea or thoracic esophagus. Thyroid gland and thoracic inlet are unremarkable. Lungs/Pleura: Large heterogeneous enhancing lesion centered in the right upper lobe measuring 10.8 x 9.4 x 10.8 cm in size with more hypoattenuating central regions which could reflect some central necrosis. This distorts and occludes the closely approximated high right hilar vessels and airways with essentially complete occlusion of the right upper lobe bronchus and extensive surrounding airless lung. Additional areas of projection into both the posterior right middle lobe (5/38 and right lower lobe (6/34). Regions of airless lung  abutting this mass lesion demonstrate heterogeneous parenchymal enhancement which could reflect postobstructive pneumonia or direct extension of the mass lesion, difficult to fully ascertain. Airways thickening as well as basilar predominant interstitial opacities and basilar density in both lower lobes are more nonspecific. Background of centrilobular and paraseptal emphysematous changes. Upper Abdomen: Periportal edematous changes, nonspecific. No visible focal liver lesion within the limitations of this exam. No other acute or suspicious upper abdominal abnormalities. Musculoskeletal: No suspicious lytic or blastic lesions. Multilevel degenerative changes are present in the imaged portions of the spine. IMPRESSION: Heterogeneously enhancing mass lesion seen in the right upper lobe with some central hypoattenuation, possibly necrotic change. Closely approximating the right hilar structures with narrowing of the airways and vessels in this vicinity. Consistent with patient's reported primary lung malignancy. Heterogeneous enhancement of the surrounding airless lung parenchyma the left upper lobe, can reflect some direct extension and or postobstructive pneumonia particularly clinical features of sepsis. Additional airways thickening, interstitial opacity and basilar consolidation, more nonspecific and could reflect infection, edema versus lymphangitic spread. Mediastinal adenopathy, concerning for metastatic disease. Aortic Atherosclerosis (ICD10-I70.0). Emphysema (ICD10-J43.9). These results were called by telephone at the time of interpretation on 09/05/2020 at 4:02 am to provider DAVID ORTIZ , who verbally acknowledged these results. Electronically Signed   By: Lovena Le M.D.   On: 08/15/2020 04:02   DG Chest Port 1 View  Result Date: 08/13/2020 CLINICAL DATA:  Questionable sepsis EXAM: PORTABLE CHEST 1 VIEW COMPARISON:  02/18/2018 FINDINGS: Complete opacification of the right upper lung/lobe. This could  reflect infiltrate or mass. No confluent opacity on the left. No visible effusions. Mild hyperinflation/COPD. Heart is normal size. IMPRESSION: Complete opacification of the right upper lobe. This could reflect pneumonia or mass. Postobstructive process cannot be excluded. Consider further evaluation with chest CT with IV contrast. Electronically Signed   By: Rolm Baptise M.D.   On: 08/27/2020 01:19    Scheduled Meds:  busPIRone  5 mg Oral BID   Chlorhexidine Gluconate Cloth  6 each Topical Daily   enoxaparin (LOVENOX) injection  40 mg Subcutaneous Q24H   ipratropium-albuterol  3 mL Nebulization Q6H   lidocaine  1 patch Transdermal Q24H   mouth rinse  15 mL Mouth Rinse BID   oxyCODONE-acetaminophen  1 tablet Oral Q6H   predniSONE  40 mg Oral Q breakfast  QUEtiapine  25 mg Oral BID   Continuous Infusions:  azithromycin Stopped (08/15/20 0235)   ceFEPime (MAXIPIME) IV 2 g (08/15/20 4360)   lactated ringers 100 mL/hr at 08/15/20 0425     LOS: 1 day   Time spent: 55 mins   Jailin Moomaw Wynetta Emery, MD How to contact the [facility] Attending or Consulting provider [place] or covering provider during after hours [place], for this patient?  Check the care team in [facility] and look for a) attending/consulting [facility] provider listed and b) the [facility] team listed Log into [email or web address] and use [place]'s universal password to access. If you do not have the password, please contact the hospital operator. Locate the [facility] provider you are looking for under [facility] and page to a number that you can be directly reached. If you still have difficulty reaching the provider, please page the [facility] (Director on Call) for the Hospitalists listed on amion for assistance.  08/15/2020, 2:21 PM

## 2020-08-15 NOTE — Progress Notes (Signed)
Bipap order is PRN.  No distress noted at this time.  Will continue to monitor.

## 2020-08-16 DIAGNOSIS — J9601 Acute respiratory failure with hypoxia: Secondary | ICD-10-CM

## 2020-08-16 DIAGNOSIS — Z7189 Other specified counseling: Secondary | ICD-10-CM | POA: Diagnosis not present

## 2020-08-16 DIAGNOSIS — I700 Atherosclerosis of aorta: Secondary | ICD-10-CM | POA: Diagnosis not present

## 2020-08-16 DIAGNOSIS — R918 Other nonspecific abnormal finding of lung field: Secondary | ICD-10-CM | POA: Diagnosis not present

## 2020-08-16 DIAGNOSIS — J189 Pneumonia, unspecified organism: Secondary | ICD-10-CM

## 2020-08-16 DIAGNOSIS — Z85118 Personal history of other malignant neoplasm of bronchus and lung: Secondary | ICD-10-CM | POA: Diagnosis not present

## 2020-08-16 DIAGNOSIS — R0602 Shortness of breath: Secondary | ICD-10-CM

## 2020-08-16 DIAGNOSIS — J181 Lobar pneumonia, unspecified organism: Secondary | ICD-10-CM | POA: Diagnosis not present

## 2020-08-16 DIAGNOSIS — J9621 Acute and chronic respiratory failure with hypoxia: Secondary | ICD-10-CM | POA: Diagnosis not present

## 2020-08-16 DIAGNOSIS — E440 Moderate protein-calorie malnutrition: Secondary | ICD-10-CM | POA: Insufficient documentation

## 2020-08-16 DIAGNOSIS — J441 Chronic obstructive pulmonary disease with (acute) exacerbation: Secondary | ICD-10-CM | POA: Diagnosis not present

## 2020-08-16 LAB — CBC WITH DIFFERENTIAL/PLATELET
Abs Immature Granulocytes: 0.08 10*3/uL — ABNORMAL HIGH (ref 0.00–0.07)
Basophils Absolute: 0 10*3/uL (ref 0.0–0.1)
Basophils Relative: 0 %
Eosinophils Absolute: 0 10*3/uL (ref 0.0–0.5)
Eosinophils Relative: 0 %
HCT: 23.9 % — ABNORMAL LOW (ref 39.0–52.0)
Hemoglobin: 7 g/dL — ABNORMAL LOW (ref 13.0–17.0)
Immature Granulocytes: 1 %
Lymphocytes Relative: 5 %
Lymphs Abs: 0.6 10*3/uL — ABNORMAL LOW (ref 0.7–4.0)
MCH: 26.1 pg (ref 26.0–34.0)
MCHC: 29.3 g/dL — ABNORMAL LOW (ref 30.0–36.0)
MCV: 89.2 fL (ref 80.0–100.0)
Monocytes Absolute: 0.7 10*3/uL (ref 0.1–1.0)
Monocytes Relative: 6 %
Neutro Abs: 10.7 10*3/uL — ABNORMAL HIGH (ref 1.7–7.7)
Neutrophils Relative %: 88 %
Platelets: 334 10*3/uL (ref 150–400)
RBC: 2.68 MIL/uL — ABNORMAL LOW (ref 4.22–5.81)
RDW: 22.1 % — ABNORMAL HIGH (ref 11.5–15.5)
WBC: 12 10*3/uL — ABNORMAL HIGH (ref 4.0–10.5)
nRBC: 0 % (ref 0.0–0.2)

## 2020-08-16 LAB — BASIC METABOLIC PANEL
Anion gap: 5 (ref 5–15)
BUN: 16 mg/dL (ref 8–23)
CO2: 35 mmol/L — ABNORMAL HIGH (ref 22–32)
Calcium: 10.2 mg/dL (ref 8.9–10.3)
Chloride: 99 mmol/L (ref 98–111)
Creatinine, Ser: 0.56 mg/dL — ABNORMAL LOW (ref 0.61–1.24)
GFR, Estimated: >60 mL/min (ref >60)
Glucose, Bld: 118 mg/dL — ABNORMAL HIGH (ref 70–99)
Potassium: 4.2 mmol/L (ref 3.5–5.1)
Sodium: 139 mmol/L (ref 135–145)

## 2020-08-16 LAB — PREPARE RBC (CROSSMATCH)

## 2020-08-16 LAB — MAGNESIUM: Magnesium: 2.3 mg/dL (ref 1.7–2.4)

## 2020-08-16 LAB — ABO/RH: ABO/RH(D): A POS

## 2020-08-16 MED ORDER — MORPHINE SULFATE (PF) 2 MG/ML IV SOLN
1.5000 mg | INTRAVENOUS | Status: DC | PRN
  Administered 2020-08-16 – 2020-08-21 (×13): 1.5 mg via INTRAVENOUS
  Filled 2020-08-16 (×13): qty 1

## 2020-08-16 MED ORDER — SODIUM CHLORIDE 0.9% IV SOLUTION: Freq: Once | INTRAVENOUS | Status: DC

## 2020-08-16 MED ORDER — LORAZEPAM 1 MG PO TABS: 1.0000 mg | ORAL_TABLET | ORAL | Status: AC | PRN

## 2020-08-16 MED ORDER — FOLIC ACID 1 MG PO TABS: 1.0000 mg | ORAL_TABLET | Freq: Every day | ORAL | Status: DC

## 2020-08-16 MED ORDER — THIAMINE HCL 100 MG/ML IJ SOLN
100.0000 mg | Freq: Every day | INTRAMUSCULAR | Status: DC
  Administered 2020-08-16 – 2020-08-20 (×5): 100 mg via INTRAVENOUS
  Filled 2020-08-16 (×5): qty 2

## 2020-08-16 MED ORDER — THIAMINE HCL 100 MG PO TABS: 100.0000 mg | ORAL_TABLET | Freq: Every day | ORAL | Status: DC

## 2020-08-16 MED ORDER — ADULT MULTIVITAMIN W/MINERALS CH: 1.0000 | ORAL_TABLET | Freq: Every day | ORAL | Status: DC

## 2020-08-16 MED ORDER — FUROSEMIDE 10 MG/ML IJ SOLN
40.0000 mg | Freq: Once | INTRAMUSCULAR | Status: AC
  Administered 2020-08-16: 40 mg via INTRAVENOUS
  Filled 2020-08-16: qty 4

## 2020-08-16 MED ORDER — LORAZEPAM 2 MG/ML IJ SOLN: 1.0000 mg | INTRAMUSCULAR | Status: AC | PRN

## 2020-08-16 MED ORDER — LORAZEPAM 2 MG/ML IJ SOLN
0.0000 mg | Freq: Two times a day (BID) | INTRAMUSCULAR | Status: AC
  Administered 2020-08-19: 2 mg via INTRAVENOUS
  Filled 2020-08-16: qty 1

## 2020-08-16 MED ORDER — FOLIC ACID 5 MG/ML IJ SOLN
1.0000 mg | Freq: Every day | INTRAMUSCULAR | Status: DC
  Administered 2020-08-16 – 2020-08-20 (×5): 1 mg via INTRAVENOUS
  Filled 2020-08-16 (×5): qty 0.2

## 2020-08-16 MED ORDER — LORAZEPAM 2 MG/ML IJ SOLN
0.0000 mg | Freq: Four times a day (QID) | INTRAMUSCULAR | Status: AC
  Administered 2020-08-16: 4 mg via INTRAVENOUS
  Administered 2020-08-17: 2 mg via INTRAVENOUS
  Administered 2020-08-17: 3 mg via INTRAVENOUS
  Administered 2020-08-17: 2 mg via INTRAVENOUS
  Filled 2020-08-16: qty 2
  Filled 2020-08-16 (×2): qty 1
  Filled 2020-08-16: qty 2

## 2020-08-16 MED ORDER — MORPHINE SULFATE (CONCENTRATE) 10 MG/0.5ML PO SOLN
2.5000 mg | ORAL | Status: DC | PRN
  Administered 2020-08-16 – 2020-08-21 (×4): 2.6 mg via ORAL
  Filled 2020-08-16 (×4): qty 0.5

## 2020-08-16 NOTE — Progress Notes (Signed)
Patient's O2 saturation desating on nasal cannula and difficult to bring up above 90% along with maintaining above 90% even on 15 Liters High Flow. Patient respirations high and in the 30-40's and patient agitated/anxious related to having a hard time breathing. Respiratory and Dr Wynetta Emery made aware and in to assess. PRN morphine given. Bipap placed on patient and IV lasix given with good effect. Patient appears to be resting comfortably. Palliative aware also.

## 2020-08-16 NOTE — Progress Notes (Signed)
PROGRESS NOTE   Robert [name]  TFT:[id number] DOB: Feb 08, 1955 DOA: 08/20/2020 PCP: [facility]   Chief Complaint  Patient presents with   Shortness of Breath   Level of care: Med-Surg  Brief Admission History:  65 y.o. male with medical history significant of alcohol admission, anxiety, depression, bipolar disorder, PTSD, panic attacks, COPD, hyperlipidemia, history of pneumonia, history of lung cancer who is brought to the emergency department via EMS due to dyspnea.  He was recently discharged from [facility], [place] due to sepsis secondary to pneumonia in the setting of newly diagnosed lung cancer.  He received 125 mg of Solu-Medrol IV PE, 2 duo nebs and an albuterol neb in route.  He is currently on BiPAP and unable to provide much information.  He denied having any headache, chest, back or abdominal pain at this time.   ED Course: Initial vital signs were temperature 103.6 F, pulse 01/09/1931, respirations 37, BP 116/50 mmHg and O2 sat 98% on NRB oxygen.  The patient was going to be intubated by Dr. Wyvonnia Dusky but he declined and wants to be DNR.  This was confirmed to me while I was examining him.  He did receive acetaminophen 650 mg rectally, 3000 mL of IV fluid bolus, magnesium sulfate 2 g IVPB, no other DuoNeb, cefepime and vancomycin.  Assessment & Plan:   Principal Problem:   Right upper lobe consolidation (HCC) Active Problems:   Bipolar 1 disorder (HCC)   COPD exacerbation (HCC)   Acute on chronic respiratory failure with hypoxia and hypercapnia (HCC)   Normocytic anemia   History of lung cancer   Aortic atherosclerosis (HCC)   Acute and chronic respiratory failure with hypoxia (HCC)   Malnutrition of moderate degree   Acute on chronic respiratory failure with hypoxia and hypercapnia-secondary to COPD exacerbation and right upper lobe consolidation and tumor.   Pt was initially treated with BiPAP therapy and subsequently weaned to nasal cannula  oxygen.  He is more comfortable now.  He does not want to escalate care at this point and has requested DNR DNI status.  Continue to treat for pneumonia hospital associated and continue supportive measures.  Unfortunately he is decompensating further, I called and spoke with family and they are agreeable to resuming bipap therapy.  I was clear with them that he is critically ill and likely suffering from respiratory distress.  Palliative care started him on morphine for SOB symptoms.   Family remain hopeful that he will go home. I set clear expectations with them that I am doubtful.   Continue palliative discussions.   HCAP - continue cefepime as ordered and supportive measures.  DC vancomycin with negative MRSA screen PCR.   Bipolar disorder -resume home medication. Cannot give while on bipap.   Newly diagnosed RUL malignancy - Pt was scheduled for outpatient follow up with PET scan and other tests.  I have requested a inpatient palliative medicine consultation for further goals of care discussions.  Anemia in neoplastic disease - Hg down to 7.  Give 1 unit PRBC today.      DVT prophylaxis: enoxaparin  Code Status: DNR  Family Communication: son telephone 08/16/20.   Disposition: TBD Status is: Inpatient  Remains inpatient appropriate because:Inpatient level of care appropriate due to severity of illness  Dispo: The patient is from: Home              Anticipated d/c is to: Home  Patient currently is not medically stable to d/c.   Difficult to place patient No  Consultants:  Palliative care   Procedures:  N/a   Antimicrobials:  Cefepime 8/7>> Vancomycin 8/7-8/8    Subjective: Pt appears terminally ill, he is somnolent and confused.    Objective: Vitals:   08/16/20 1200 08/16/20 1213 08/16/20 1223 08/16/20 1320  BP: (!) 116/56     Pulse: 84  86   Resp: (!) 21 18 (!) 21   Temp:   98.1 F (36.7 C)   TempSrc:   Oral   SpO2: 99%  98% 100%  Weight:      Height:         Intake/Output Summary (Last 24 hours) at 08/16/2020 1444 Last data filed at 08/16/2020 0600 Gross per 24 hour  Intake 1727.08 ml  Output 1525 ml  Net 202.08 ml   Filed Weights   08/22/2020 0030 08/27/2020 1243 08/15/20 0400  Weight: 62.7 kg 63.6 kg 64.8 kg    Examination:  General exam: Appears terminally ill, confused, pale and having increasing respiratory distress, he is oriented to person only, . Chronically ill appearing, appears older than stated age.  Respiratory system: diminished BS LUL, shallow breathing.  Cardiovascular system: normal S1 & S2 heard. No JVD, murmurs, rubs, gallops or clicks. No pedal edema. Gastrointestinal system: Abdomen is nondistended, soft and nontender. No organomegaly or masses felt. Normal bowel sounds heard. Central nervous system: Alert and oriented. No focal neurological deficits. Extremities: Symmetric 5 x 5 power. Skin: No rashes, lesions or ulcers Psychiatry: Judgement and insight appear normal. Mood & affect appropriate.   Data Reviewed: I have personally reviewed following labs and imaging studies  CBC: Recent Labs  Lab 08/27/2020 0033 08/15/20 0447 08/16/20 0314  WBC 23.9* 15.1* 12.0*  NEUTROABS 22.1*  --  10.7*  HGB 9.7* 7.7* 7.0*  HCT 32.0* 26.0* 23.9*  MCV 87.4 87.0 89.2  PLT 369 279 517    Basic Metabolic Panel: Recent Labs  Lab 09/07/2020 0033 08/15/20 0447 08/16/20 0314  NA 132* 136 139  K 4.4 4.5 4.2  CL 92* 98 99  CO2 33* 33* 35*  GLUCOSE 142* 124* 118*  BUN 11 11 16   CREATININE 0.62 0.45* 0.56*  CALCIUM 9.7 9.8 10.2  MG  --  2.0 2.3    GFR: Estimated Creatinine Clearance: 84.4 mL/min (A) (by C-G formula based on SCr of 0.56 mg/dL (L)).  Liver Function Tests: Recent Labs  Lab 09/02/2020 0033 08/15/20 0447  AST 14* 13*  ALT 14 13  ALKPHOS 84 69  BILITOT 0.7 0.6  PROT 7.0 5.7*  ALBUMIN 2.6* 2.1*    CBG: No results for input(s): GLUCAP in the last 168 hours.  Recent Results (from the past 240 hour(s))   Blood Culture (routine x 2)     Status: None (Preliminary result)   Collection Time: 08/18/2020 12:28 AM   Specimen: Right Antecubital; Blood  Result Value Ref Range Status   Specimen Description RIGHT ANTECUBITAL  Final   Special Requests   Final    BOTTLES DRAWN AEROBIC AND ANAEROBIC Blood Culture adequate volume   Culture   Final    NO GROWTH < 12 HOURS Performed at [facility], [address], [place], [place] [address]    Report Status PENDING  Incomplete  Blood Culture (routine x 2)     Status: None (Preliminary result)   Collection Time: 09/04/2020 12:30 AM   Specimen: BLOOD RIGHT FOREARM  Result Value Ref Range  Status   Specimen Description BLOOD RIGHT FOREARM  Final   Special Requests   Final    BOTTLES DRAWN AEROBIC AND ANAEROBIC Blood Culture adequate volume   Culture   Final    NO GROWTH < 12 HOURS Performed at [facility], [address], [place], [place] [address]    Report Status PENDING  Incomplete  Resp Panel by RT-PCR (Flu A&B, Covid) Nasopharyngeal Swab     Status: None   Collection Time: 08/27/2020 12:33 AM   Specimen: Nasopharyngeal Swab; Nasopharyngeal(NP) swabs in vial transport medium  Result Value Ref Range Status   SARS Coronavirus 2 by RT PCR NEGATIVE NEGATIVE Final    Comment: (NOTE) SARS-CoV-2 target nucleic acids are NOT DETECTED.  The SARS-CoV-2 RNA is generally detectable in upper respiratory specimens during the acute phase of infection. The lowest concentration of SARS-CoV-2 viral copies this assay can detect is 138 copies/mL. A negative result does not preclude SARS-Cov-2 infection and should not be used as the sole basis for treatment or other patient management decisions. A negative result may occur with  improper specimen collection/handling, submission of specimen other than nasopharyngeal swab, presence of viral mutation(s) within the areas targeted by this assay, and inadequate number of viral copies(<138 copies/mL). A negative result must  be combined with clinical observations, patient history, and epidemiological information. The expected result is Negative.  Fact Sheet for Patients:  EntrepreneurPulse.com.au  Fact Sheet for Healthcare Providers:  IncredibleEmployment.be  This test is no t yet approved or cleared by the [place] FDA and  has been authorized for detection and/or diagnosis of SARS-CoV-2 by FDA under an Emergency Use Authorization (EUA). This EUA will remain  in effect (meaning this test can be used) for the duration of the COVID-19 declaration under Section 564(b)(1) of the Act, 21 U.S.C.section 360bbb-3(b)(1), unless the authorization is terminated  or revoked sooner.       Influenza A by PCR NEGATIVE NEGATIVE Final   Influenza B by PCR NEGATIVE NEGATIVE Final    Comment: (NOTE) The Xpert Xpress SARS-CoV-2/FLU/RSV plus assay is intended as an aid in the diagnosis of influenza from Nasopharyngeal swab specimens and should not be used as a sole basis for treatment. Nasal washings and aspirates are unacceptable for Xpert Xpress SARS-CoV-2/FLU/RSV testing.  Fact Sheet for Patients: EntrepreneurPulse.com.au  Fact Sheet for Healthcare Providers: IncredibleEmployment.be  This test is not yet approved or cleared by the [place] FDA and has been authorized for detection and/or diagnosis of SARS-CoV-2 by FDA under an Emergency Use Authorization (EUA). This EUA will remain in effect (meaning this test can be used) for the duration of the COVID-19 declaration under Section 564(b)(1) of the Act, 21 U.S.C. section 360bbb-3(b)(1), unless the authorization is terminated or revoked.  Performed at [facility], [address] [place], [place] [address]   MRSA Next Gen by PCR, Nasal     Status: None   Collection Time: 08/31/2020  9:07 AM   Specimen: Nasal Mucosa; Nasal Swab  Result Value Ref Range Status   MRSA by PCR Next Gen  NOT DETECTED NOT DETECTED Final    Comment: (NOTE) The GeneXpert MRSA Assay (FDA approved for NASAL specimens only), is one component of a comprehensive MRSA colonization surveillance program. It is not intended to diagnose MRSA infection nor to guide or monitor treatment for MRSA infections. Test performance is not FDA approved in patients less than 53 years old. Performed at [facility], [address] [place],  [address]  Radiology Studies: No results found.  Scheduled Meds:  sodium chloride   Intravenous Once   aspirin EC  81 mg Oral Daily   busPIRone  5 mg Oral BID   Chlorhexidine Gluconate Cloth  6 each Topical Daily   DULoxetine  30 mg Oral BID   enoxaparin (LOVENOX) injection  40 mg Subcutaneous Q24H   feeding supplement  237 mL Oral BID BM   ferrous sulfate  325 mg Oral Q breakfast   folic acid  1 mg Intravenous Daily   guaiFENesin  600 mg Oral BID   ipratropium-albuterol  3 mL Nebulization Q6H   lidocaine  1 patch Transdermal Q24H   LORazepam  0-4 mg Intravenous Q6H   Followed by   Derrill Memo ON 08/18/2020] LORazepam  0-4 mg Intravenous Q12H   mouth rinse  15 mL Mouth Rinse BID   mirtazapine  7.5 mg Oral QHS   mometasone-formoterol  2 puff Inhalation BID   multivitamin with minerals  1 tablet Oral Daily   oxyCODONE-acetaminophen  1 tablet Oral Q6H   predniSONE  40 mg Oral Q breakfast   QUEtiapine  25 mg Oral BID   thiamine  100 mg Oral Daily   Or   thiamine  100 mg Intravenous Daily   Continuous Infusions:  azithromycin 250 mL/hr at 08/16/20 0203   ceFEPime (MAXIPIME) IV 2 g (08/16/20 0831)   lactated ringers Stopped (08/16/20 0141)     LOS: 2 days   Time spent: 36 mins   Loie Jahr Wynetta Emery, MD How to contact the [facility] Attending or Consulting provider [place] or covering provider during after hours [place], for this patient?  Check the care team in [facility] and look for a) attending/consulting [facility] provider listed and b) the [facility] team listed Log into  [email or web address] and use [place]'s universal password to access. If you do not have the password, please contact the hospital operator. Locate the [facility] provider you are looking for under [facility] and page to a number that you can be directly reached. If you still have difficulty reaching the provider, please page the [facility] (Director on Call) for the Hospitalists listed on amion for assistance.  08/16/2020, 2:44 PM

## 2020-08-16 NOTE — Consult Note (Signed)
Consultation Note Date: 08/16/2020   Patient Name: Robert [name]  DOB: Jan 07, 1956  MRN: [id number]  Age / Sex: 65 y.o., male  PCP: [facility] Referring Physician: Murlean Iba, MD  Reason for Consultation: Establishing goals of care  HPI/Patient Profile: 65 y.o. 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   Clinical Assessment and Goals of Care: I met with patient's son, Antony Haste, son, Yong Channel, and daughter, Mendel Ryder.  Prior to this admission Robert [name] had been home for approximately 1 week from his recent hospitalization in [place]. He has had an ongoing decline in his functional status-he has a hospital bed at home that was provided after his discharge last week.  He is able to ambulate only minimally without becoming short of breath this has been ongoing for several months.  He is not able to bathe himself.  He was using a bedside toilet and a urinal.  Family has noticed a decline in his cognitive function with intermittent confusion.  He appears to have conversations with people that he has had in the past.  He has difficulty sleeping he is often awake and agitated during the day and the day and the night.  They have also noticed significant weight loss in the last 6 months. 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  Noted that nursing was concerned about alcohol withdrawal last night-however family reports patient does not drink, he does not have access to any alcohol they do not bring him any alcohol he is  unable to go to the store on his own and buy alcohol. We discussed his current illness and comorbidities and possible trajectories. Family shares that Robert [name] had recently had a visit to the oncologist and was encouraged by the news he could receive treatment for his cancer.  He was planned to have a PET scan this Friday and then start treatment after that. We discussed his current acute illness of sepsis and postobstructive pneumonia.  While his respiratory status is improved to the point where he is off BiPAP he does still appear very ill.  The complications of obstructive pneumonia were discussed and the difficulties of clearing an obstructive pneumonia without being able to treat the obstruction, i.e. his cancer.  Patient's quality of life was discussed-family agrees that his current quality of life is not wanting that he would consider good quality.  We discussed the difficulty of chronic illness and now with a new cancer diagnosis how this worsens his prior chronic illness of COPD that was affected already affecting his quality of life.  We discussed the concerns for how cancer treatment will affect his functional status even more, and questions were  raised as to if treating cancer will improve or worsen his quality of life-and the worries that he may not even survive this hospitalization to make it to that treatment. Goals of care were discussed-for now family would like to reevaluate patient daily and look for signs of improvement versus decline.  They are hopeful for him to be able to discharge home and revisit the idea of chemotherapy for his cancer.  Their initial plan was to see how he tolerated treatment and then discussed hospice based on that. Hospice services were discussed.  Family is aware that if decision was made not to proceed with further treatment for either his pneumonia or his chemotherapy then he would be eligible for hospice services. Ongoing aggressive medical care versus  transition to comfort was discussed. Advanced directives were discussed.  Noted patient himself stated he would like to be DNR and would not want to be put on life support.  Family is in agreement with this.    Primary Decision Maker NEXT OF KIN- patient's children together    SUMMARY OF RECOMMENDATIONS -Post obstructive pneumonia r/t lung cancer-Continue current interventions- family is hopeful to treat postobstructive pneumonia and discharge home- note that he made visit to Oncologist in between hospitalizations and he was offered chemotherapy -Family aware he is at high risk to decompensate quickly and he is DNR -PMT will continue to follow and evaluate his status and assist family with decision making  New dx lung cancer in the setting of advanced COPD-He would benefit from outpatient Palliative followup- he lives in [place]- will ask [facility] for referral to [facility] who is the provider of Palliative in their area -He was discharged from [facility] with home health- they would like to resume these services when he is discarged  -Shortness of breath-  start concentrated morphine 2.45m q2hr prn for symptom relief  Code Status/Advance Care Planning: DNR   Prognosis:   Unable to determine  Discharge Planning: Home with Home Health  Primary Diagnoses: Present on Admission:  Right upper lobe consolidation ([facility]  Acute on chronic respiratory failure with hypoxia and hypercapnia (HCC)  Bipolar 1 disorder ([facility]  Normocytic anemia  COPD exacerbation ([facility]  Aortic atherosclerosis ([facility]  Acute and chronic respiratory failure with hypoxia ([facility]   I have reviewed the medical record, interviewed the patient and family, and examined the patient. The following aspects are pertinent.  Past Medical History:  Diagnosis Date   Alcohol addiction ([facility]    Anxiety    Bipolar 1 disorder (HCC)    COPD (chronic obstructive pulmonary disease) (HCC)    Depression    High  cholesterol    Lung cancer (HCC)    Panic    Pneumonia    PTSD (post-traumatic stress disorder)    Social History   Socioeconomic History   Marital status: Single    Spouse name: Not on file   Number of children: Not on file   Years of education: Not on file   Highest education level: Not on file  Occupational History   Not on file  Tobacco Use   Smoking status: Some Days    Packs/day: 0.50    Years: 45.00    Pack years: 22.50    Types: Cigarettes   Smokeless tobacco: Never  Vaping Use   Vaping Use: Never used  Substance and Sexual Activity   Alcohol use: No   Drug use: No   Sexual activity: Yes    Birth control/protection: None  Other Topics  Concern   Not on file  Social History Narrative   Not on file   Social Determinants of Health   Financial Resource Strain: Not on file  Food Insecurity: Not on file  Transportation Needs: Not on file  Physical Activity: Not on file  Stress: Not on file  Social Connections: Not on file   Scheduled Meds:  aspirin EC  81 mg Oral Daily   busPIRone  5 mg Oral BID   Chlorhexidine Gluconate Cloth  6 each Topical Daily   DULoxetine  30 mg Oral BID   enoxaparin (LOVENOX) injection  40 mg Subcutaneous Q24H   feeding supplement  237 mL Oral BID BM   ferrous sulfate  325 mg Oral Q breakfast   folic acid  1 mg Oral Daily   guaiFENesin  600 mg Oral BID   ipratropium-albuterol  3 mL Nebulization Q6H   lidocaine  1 patch Transdermal Q24H   LORazepam  0-4 mg Intravenous Q6H   Followed by   Derrill Memo ON 08/18/2020] LORazepam  0-4 mg Intravenous Q12H   mouth rinse  15 mL Mouth Rinse BID   mirtazapine  7.5 mg Oral QHS   mometasone-formoterol  2 puff Inhalation BID   multivitamin with minerals  1 tablet Oral Daily   oxyCODONE-acetaminophen  1 tablet Oral Q6H   predniSONE  40 mg Oral Q breakfast   QUEtiapine  25 mg Oral BID   thiamine  100 mg Oral Daily   Or   thiamine  100 mg Intravenous Daily   Continuous Infusions:  azithromycin  250 mL/hr at 08/16/20 0203   ceFEPime (MAXIPIME) IV 2 g (08/16/20 0831)   lactated ringers Stopped (08/16/20 0141)   PRN Meds:.acetaminophen **OR** acetaminophen, diphenhydrAMINE, LORazepam **OR** LORazepam, morphine CONCENTRATE, ondansetron **OR** ondansetron (ZOFRAN) IV Medications Prior to Admission:  Prior to Admission medications   Medication Sig Start Date End Date Taking? Authorizing Provider  aspirin EC 81 MG tablet Take 1 tablet (81 mg total) by mouth daily. 02/17/16  Yes Isaac Bliss, Rayford Halsted, MD  busPIRone (BUSPAR) 5 MG tablet Take 5 mg by mouth 2 (two) times daily. 08/05/20  Yes [provider]  diphenhydrAMINE (BENADRYL) 25 mg capsule Take 25 mg by mouth at bedtime as needed for sleep.    Yes [provider]  DULoxetine (CYMBALTA) 30 MG capsule Take 30 mg by mouth 2 (two) times daily. 01/28/18  Yes [provider]  EQ NICOTINE 21 MG/24HR patch 21 mg daily. 08/05/20  Yes [provider]  FEROSUL 325 (65 Fe) MG tablet Take 325 mg by mouth daily. 08/05/20  Yes [provider]  Fluticasone-Salmeterol (ADVAIR DISKUS) 250-50 MCG/DOSE AEPB Inhale 1 puff into the lungs 2 (two) times daily. 02/25/18  Yes Tat, Shanon Brow, MD  ipratropium (ATROVENT) 0.02 % nebulizer solution SMARTSIG:1 Via Nebulizer Every 6 Hours 08/08/20  Yes [provider]  lidocaine (LIDODERM) 5 % SMARTSIG:Topical 08/05/20  Yes [provider]  mirtazapine (REMERON) 7.5 MG tablet Take 7.5 mg by mouth at bedtime. 08/05/20  Yes [provider]  oxyCODONE-acetaminophen (PERCOCET/ROXICET) 5-325 MG tablet Take 1 tablet by mouth every 6 (six) hours. 08/11/20  Yes [provider]  OXYGEN Inhale 3.5-4 L into the lungs continuous.    Yes [provider]  QUEtiapine (SEROQUEL) 25 MG tablet Take 25 mg by mouth 2 (two) times daily. 08/05/20  Yes [provider]  albuterol (PROVENTIL HFA;VENTOLIN HFA) 108 (90 BASE) MCG/ACT inhaler Inhale 2 puffs into  the lungs every 4 (  four) hours as needed for wheezing or shortness of breath. 09/16/11 02/18/18  Jola Schmidt, MD  EQ MUCUS ER 600 MG 12 hr tablet Take 600 mg by mouth 2 (two) times daily as needed for cough or to loosen phlegm.  11/08/17   [provider]   No Known Allergies Review of Systems  Unable to perform ROS  Physical Exam Vitals and nursing note reviewed.  Pulmonary:     Comments: Rate increased Neurological:     Comments: lethargic    Vital Signs: BP 129/76   Pulse 86   Temp (!) 97.3 F (36.3 C) (Oral)   Resp 16   Ht _0  (1.727 m)   Wt 64.8 kg   SpO2 100%   BMI 21.72 kg/m  Pain Scale: CPOT POSS *See Group Information*: 2-Acceptable,Slightly drowsy, easily aroused Pain Score: 8    SpO2: SpO2: 100 % O2 Device:SpO2: 100 % O2 Flow Rate: .O2 Flow Rate (L/min): 4 L/min  IO: Intake/output summary:  Intake/Output Summary (Last 24 hours) at 08/16/2020 1034 Last data filed at 08/16/2020 0600 Gross per 24 hour  Intake 1727.08 ml  Output 1800 ml  Net -72.92 ml    LBM: Last BM Date: 08/27/2020 Baseline Weight: Weight: 62.7 kg Most recent weight: Weight: 64.8 kg     Palliative Assessment/Data: PPS: 20%     Thank you for this consult. Palliative medicine will continue to follow and assist as needed.   Time In: 0900 Time Out: 1118 Time Total: 138 minutes Greater than 50%  of this time was spent counseling and coordinating care related to the above assessment and plan.  Signed by: Mariana Kaufman, AGNP-C Palliative Medicine    Please contact Palliative Medicine Team phone at [phone] for questions and concerns.  For individual provider: See Shea Evans

## 2020-08-16 NOTE — Progress Notes (Signed)
Additional encounter time-   Received notice that patient's respiratory was declining.  On eval he was on bipap, however continuing to use accessory muscles and looking agitated.  Long conversation with son, Antony Haste and multiple family members.  Patient appears to be dying, and is suffering. Options of comfort measures vs continued bipap, blood transfusion ongoing aggressive medical interventions discussed.  Family is at bedside and they are considering options. They will notify nurse of decision when they are ready.  If patient is transitioned to comfort overnight recommend the following:   1- Morphine infusion 2mg /hr with 2mg   bolus q15 min prn for air hunger or RR>26 2- Lorazepam 2mg  IV q4hr scheduled 3- Remove Bipap AFTER morphine infusion and bolus doses, after lorazepam dose- and only when patient appears comfortable- transition to oxygen [place] at the LPM he was at prior to bipap- then slowly decrease LPM to his baseline at home- give morphine BOLUSES for signs of increased respiratory effort, RR>26, use of accessory muscles -Robinul .64mcg q4hr IV scheduled  PMT will followup in the morning.   Mariana Kaufman, AGNP-C Palliative Medicine  Total additional time: 71 minutes

## 2020-08-17 ENCOUNTER — Inpatient Hospital Stay (HOSPITAL_COMMUNITY): Payer: Medicare Other

## 2020-08-17 DIAGNOSIS — R918 Other nonspecific abnormal finding of lung field: Secondary | ICD-10-CM

## 2020-08-17 DIAGNOSIS — F319 Bipolar disorder, unspecified: Secondary | ICD-10-CM | POA: Diagnosis not present

## 2020-08-17 DIAGNOSIS — A419 Sepsis, unspecified organism: Secondary | ICD-10-CM

## 2020-08-17 DIAGNOSIS — J181 Lobar pneumonia, unspecified organism: Secondary | ICD-10-CM | POA: Diagnosis not present

## 2020-08-17 DIAGNOSIS — J9601 Acute respiratory failure with hypoxia: Secondary | ICD-10-CM | POA: Diagnosis not present

## 2020-08-17 DIAGNOSIS — J9621 Acute and chronic respiratory failure with hypoxia: Secondary | ICD-10-CM | POA: Diagnosis not present

## 2020-08-17 DIAGNOSIS — R6520 Severe sepsis without septic shock: Secondary | ICD-10-CM

## 2020-08-17 LAB — TYPE AND SCREEN
ABO/RH(D): A POS
Antibody Screen: NEGATIVE
Unit division: 0

## 2020-08-17 LAB — BPAM RBC
Blood Product Expiration Date: 202209012359
ISSUE DATE / TIME: 202208092149
Unit Type and Rh: 6200

## 2020-08-17 MED ORDER — METOPROLOL TARTRATE 5 MG/5ML IV SOLN
2.5000 mg | Freq: Four times a day (QID) | INTRAVENOUS | Status: DC
  Administered 2020-08-17 – 2020-08-20 (×13): 2.5 mg via INTRAVENOUS
  Filled 2020-08-17 (×13): qty 5

## 2020-08-17 MED ORDER — METHYLPREDNISOLONE SODIUM SUCC 125 MG IJ SOLR
60.0000 mg | Freq: Two times a day (BID) | INTRAMUSCULAR | Status: DC
  Administered 2020-08-17 – 2020-08-20 (×7): 60 mg via INTRAVENOUS
  Filled 2020-08-17 (×7): qty 2

## 2020-08-17 NOTE — Progress Notes (Signed)
Pharmacy Antibiotic Note  Robert [name] is a 65 y.o. male admitted on 08/28/2020 with pneumonia.  Pharmacy has been consulted for vancomycin and cefepime dosing. Acute on chronic respiratory failure with hypoxia and hypercapnia-secondary to COPD exacerbation and right upper lobe consolidation and tumor. AF, WBC improved; patient continues to decompensate   Plan: Continue cefepime 2gm IV q8 Also on azithromycin x 5 days F/u renal function, cultures and clinical course F/U LOT  Height: 5\' 8"  (172.7 cm) Weight: 66.1 kg (145 lb 11.6 oz) IBW/kg (Calculated) : 68.4  Temp (24hrs), Avg:98.5 F (36.9 C), Min:97.1 F (36.2 C), Max:99.8 F (37.7 C)  Recent Labs  Lab 08/13/2020 0033 09/07/2020 0258 08/15/20 0447 08/16/20 0314  WBC 23.9*  --  15.1* 12.0*  CREATININE 0.62  --  0.45* 0.56*  LATICACIDVEN 1.8 1.9  --   --      Estimated Creatinine Clearance: 86.1 mL/min (A) (by C-G formula based on SCr of 0.56 mg/dL (L)).    No Known Allergies  Antibiotics: Cefepime 8/7 >> Azith 8/7 >> Vanco 8/7 >>8/8  Cultures: 8/7 Bcx: ngtd 8/7 Sputum Cx: pending 8/7 MRSA PCR: neg  Thank you for allowing pharmacy to be a part of this patient's care.  Isac Sarna, BS Pharm D, [place] Clinical Pharmacist Pager [phone]  08/17/2020 11:12 AM

## 2020-08-17 NOTE — Progress Notes (Signed)
PROGRESS NOTE  Robert [name] YSA:[id number] DOB: 22-Dec-1955 DOA: 08/31/2020 PCP: [facility]  Brief History:  65 y/o male with a history of COPD, chronic respiratory failure on 3 L, anxiety, tobacco abuse, alcohol abuse, bipolar disorder, PTSD, HLD, recently diagnosed Lung Cancer brought to the emergency department via EMS due to dyspnea.  He was recently discharged from [facility], [place] due to sepsis secondary to pneumonia in the setting of newly diagnosed lung cancer.  He received 125 mg of Solu-Medrol IV PE, 2 duo nebs and an albuterol neb in route.  He is currently on BiPAP and unable to provide much information.  He denied having any headache, chest, back or abdominal pain at this time.   ED Course: Initial vital signs were temperature 103.6 F, pulse [birth year]09[birth year]1931, respirations 37, BP 116/50 mmHg and O2 sat 98% on NRB oxygen.  The patient was going to be intubated by Dr. Wyvonnia Dusky but he declined and wants to be DNR.  This was confirmed to me while I was examining him.  He did receive acetaminophen 650 mg rectally, 3000 mL of IV fluid bolus, magnesium sulfate 2 g IVPB, no other DuoNeb, cefepime and vancomycin.    Assessment/Plan: Sepsis -present on admission -presented with fever, tachycardia, tachypnea, leukocytosis -continue cefepime -MRSA neg -due to pneumonia  Acute on chronic respiratory failure with hypoxia and hypercapnea -due to pneumonia and COPD exacerbation -remains on BiPAP -does not want to escalate care at this point and has requested DNR DNI status  Lobar Pneumonia -continue cefepime -MRSA neg  COPD exacerbation -continue duonebs, pulmicort -continue IV solumedrol  Newly diagnosed RUL malignancy  -was scheduled for outpatient follow up with PET scan and other tests.   -palliative medicine consultation for further goals of care discussions. -08/31/2020 CT chest with RUL mass with central hypoattenuation; additional airway  thickening, interstitial opacity and basilar consolidation which may be edema, infection vs lymphangitic spread  Anemia in neoplastic disease -  -Hg down to 7.  Give 1 unit PRBC 8/9  Tobacco abuse -cessation discussed    SVT -intermittently up to 180-200 -start IV lopressor  Bipolar disorder -unable to take cymbalta, buspar, remeron while on bipap  Goals of Care -.palliative medicine following -Advance care planning, including the explanation and discussion of advance directives was carried out with the patient and family.  Code status including explanations of "Full Code" and "DNR" and alternatives were discussed in detail.  Discussion of end-of-life issues including but not limited palliative care, hospice care and the concept of hospice, other end-of-life care options, power of attorney for health care decisions, living wills, and physician orders for life-sustaining treatment were also discussed with the patient and family.  Total face to face time 16 minutes.    Status is: Inpatient  Remains inpatient appropriate because:Inpatient level of care appropriate due to severity of illness  Dispo: The patient is from: Home              Anticipated d/c is to: Home              Patient currently is not medically stable to d/c.   Difficult to place patient No        Family Communication:   son updated at bedside 8/10  Consultants:  palliative  Code Status: DNR  DVT Prophylaxis:  [place] Lovenox   Procedures: As Listed in Progress Note Above  Antibiotics: Cefepime 8/7>>  Subjective:  Patient remains on bipap.  Denies sob presently.  No f/c, cp, abd pain. Objective: Vitals:   08/17/20 0816 08/17/20 0900 08/17/20 1000 08/17/20 1100  BP: (!) 149/69 (!) 147/65 (!) 153/82 125/70  Pulse: (!) 102 100 (!) 108 (!) 101  Resp: (!) 26 (!) 28 (!) 28 (!) 28  Temp:      TempSrc:      SpO2: 98% 95% 99% 95%  Weight:      Height:        Intake/Output Summary (Last 24 hours)  at 08/17/2020 1132 Last data filed at 08/17/2020 0900 Gross per 24 hour  Intake 1823.47 ml  Output 3075 ml  Net -1251.53 ml   Weight change:  Exam:  General:  Pt is alert, fintermittently ollows commands appropriately, not in acute distress HEENT: No icterus, No thrush, No neck mass, [place]/AT Cardiovascular: RRR, S1/S2, no rubs, no gallops Respiratory: diminished BS.  Bibasilar wheeze and rales Abdomen: Soft/+BS, non tender, non distended, no guarding Extremities: No edema, No lymphangitis, No petechiae, No rashes, no synovitis   Data Reviewed: I have personally reviewed following labs and imaging studies Basic Metabolic Panel: Recent Labs  Lab 08/08/2020 0033 08/15/20 0447 08/16/20 0314  NA 132* 136 139  K 4.4 4.5 4.2  CL 92* 98 99  CO2 33* 33* 35*  GLUCOSE 142* 124* 118*  BUN 11 11 16   CREATININE 0.62 0.45* 0.56*  CALCIUM 9.7 9.8 10.2  MG  --  2.0 2.3   Liver Function Tests: Recent Labs  Lab 08/13/2020 0033 08/15/20 0447  AST 14* 13*  ALT 14 13  ALKPHOS 84 69  BILITOT 0.7 0.6  PROT 7.0 5.7*  ALBUMIN 2.6* 2.1*   No results for input(s): LIPASE, AMYLASE in the last 168 hours. No results for input(s): AMMONIA in the last 168 hours. Coagulation Profile: Recent Labs  Lab 09/02/2020 0033  INR 1.2   CBC: Recent Labs  Lab 08/26/2020 0033 08/15/20 0447 08/16/20 0314  WBC 23.9* 15.1* 12.0*  NEUTROABS 22.1*  --  10.7*  HGB 9.7* 7.7* 7.0*  HCT 32.0* 26.0* 23.9*  MCV 87.4 87.0 89.2  PLT 369 279 334   Cardiac Enzymes: No results for input(s): CKTOTAL, CKMB, CKMBINDEX, TROPONINI in the last 168 hours. BNP: Invalid input(s): POCBNP CBG: No results for input(s): GLUCAP in the last 168 hours. HbA1C: No results for input(s): HGBA1C in the last 72 hours. Urine analysis:    Component Value Date/Time   COLORURINE YELLOW 08/24/2020 0908   APPEARANCEUR CLEAR 08/13/2020 0908   APPEARANCEUR Clear 11/14/2013 1034   LABSPEC 1.025 08/28/2020 0908   LABSPEC 1.003 11/14/2013  1034   PHURINE 6.0 08/18/2020 0908   GLUCOSEU NEGATIVE 08/30/2020 0908   GLUCOSEU Negative 11/14/2013 1034   HGBUR MODERATE (A) 09/03/2020 0908   BILIRUBINUR NEGATIVE 08/11/2020 0908   BILIRUBINUR Negative 11/14/2013 1034   KETONESUR NEGATIVE 09/06/2020 0908   PROTEINUR NEGATIVE 08/14/2020 0908   NITRITE NEGATIVE 08/20/2020 0908   LEUKOCYTESUR NEGATIVE 08/24/2020 0908   LEUKOCYTESUR Negative 11/14/2013 1034   Sepsis Labs: @LABRCNTIP (procalcitonin:4,lacticidven:4) ) Recent Results (from the past 240 hour(s))  Blood Culture (routine x 2)     Status: None (Preliminary result)   Collection Time: 08/12/2020 12:28 AM   Specimen: Right Antecubital; Blood  Result Value Ref Range Status   Specimen Description RIGHT ANTECUBITAL  Final   Special Requests   Final    BOTTLES DRAWN AEROBIC AND ANAEROBIC Blood Culture adequate volume   Culture   Final  NO GROWTH 3 DAYS Performed at [facility], [address], [place], [place] [address]    Report Status PENDING  Incomplete  Blood Culture (routine x 2)     Status: None (Preliminary result)   Collection Time: 09/07/2020 12:30 AM   Specimen: BLOOD RIGHT FOREARM  Result Value Ref Range Status   Specimen Description BLOOD RIGHT FOREARM  Final   Special Requests   Final    BOTTLES DRAWN AEROBIC AND ANAEROBIC Blood Culture adequate volume   Culture   Final    NO GROWTH 3 DAYS Performed at [facility], [address], [place], [place] [address]    Report Status PENDING  Incomplete  Resp Panel by RT-PCR (Flu A&B, Covid) Nasopharyngeal Swab     Status: None   Collection Time: 08/13/2020 12:33 AM   Specimen: Nasopharyngeal Swab; Nasopharyngeal(NP) swabs in vial transport medium  Result Value Ref Range Status   SARS Coronavirus 2 by RT PCR NEGATIVE NEGATIVE Final    Comment: (NOTE) SARS-CoV-2 target nucleic acids are NOT DETECTED.  The SARS-CoV-2 RNA is generally detectable in upper respiratory specimens during the acute phase of infection. The  lowest concentration of SARS-CoV-2 viral copies this assay can detect is 138 copies/mL. A negative result does not preclude SARS-Cov-2 infection and should not be used as the sole basis for treatment or other patient management decisions. A negative result may occur with  improper specimen collection/handling, submission of specimen other than nasopharyngeal swab, presence of viral mutation(s) within the areas targeted by this assay, and inadequate number of viral copies(<138 copies/mL). A negative result must be combined with clinical observations, patient history, and epidemiological information. The expected result is Negative.  Fact Sheet for Patients:  EntrepreneurPulse.com.au  Fact Sheet for Healthcare Providers:  IncredibleEmployment.be  This test is no t yet approved or cleared by the [place] FDA and  has been authorized for detection and/or diagnosis of SARS-CoV-2 by FDA under an Emergency Use Authorization (EUA). This EUA will remain  in effect (meaning this test can be used) for the duration of the COVID-19 declaration under Section 564(b)(1) of the Act, 21 U.S.C.section 360bbb-3(b)(1), unless the authorization is terminated  or revoked sooner.       Influenza A by PCR NEGATIVE NEGATIVE Final   Influenza B by PCR NEGATIVE NEGATIVE Final    Comment: (NOTE) The Xpert Xpress SARS-CoV-2/FLU/RSV plus assay is intended as an aid in the diagnosis of influenza from Nasopharyngeal swab specimens and should not be used as a sole basis for treatment. Nasal washings and aspirates are unacceptable for Xpert Xpress SARS-CoV-2/FLU/RSV testing.  Fact Sheet for Patients: EntrepreneurPulse.com.au  Fact Sheet for Healthcare Providers: IncredibleEmployment.be  This test is not yet approved or cleared by the [place] FDA and has been authorized for detection and/or diagnosis of SARS-CoV-2 by FDA under  an Emergency Use Authorization (EUA). This EUA will remain in effect (meaning this test can be used) for the duration of the COVID-19 declaration under Section 564(b)(1) of the Act, 21 U.S.C. section 360bbb-3(b)(1), unless the authorization is terminated or revoked.  Performed at [facility], [address] [place],  [address]   MRSA Next Gen by PCR, Nasal     Status: None   Collection Time: 08/21/2020  9:07 AM   Specimen: Nasal Mucosa; Nasal Swab  Result Value Ref Range Status   MRSA by PCR Next Gen NOT DETECTED NOT DETECTED Final    Comment: (NOTE) The GeneXpert MRSA Assay (FDA approved for NASAL specimens  only), is one component of a comprehensive MRSA colonization surveillance program. It is not intended to diagnose MRSA infection nor to guide or monitor treatment for MRSA infections. Test performance is not FDA approved in patients less than 47 years old. Performed at [facility], [address] [place], [place] [address]      Scheduled Meds:  sodium chloride   Intravenous Once   aspirin EC  81 mg Oral Daily   busPIRone  5 mg Oral BID   Chlorhexidine Gluconate Cloth  6 each Topical Daily   DULoxetine  30 mg Oral BID   enoxaparin (LOVENOX) injection  40 mg Subcutaneous Q24H   feeding supplement  237 mL Oral BID BM   ferrous sulfate  325 mg Oral Q breakfast   folic acid  1 mg Intravenous Daily   guaiFENesin  600 mg Oral BID   ipratropium-albuterol  3 mL Nebulization Q6H   lidocaine  1 patch Transdermal Q24H   LORazepam  0-4 mg Intravenous Q6H   Followed by   Derrill Memo ON 08/18/2020] LORazepam  0-4 mg Intravenous Q12H   mouth rinse  15 mL Mouth Rinse BID   mirtazapine  7.5 mg Oral QHS   mometasone-formoterol  2 puff Inhalation BID   multivitamin with minerals  1 tablet Oral Daily   predniSONE  40 mg Oral Q breakfast   QUEtiapine  25 mg Oral BID   thiamine  100 mg Oral Daily   Or   thiamine  100 mg Intravenous Daily   Continuous Infusions:  azithromycin 500 mg  (08/17/20 0335)   ceFEPime (MAXIPIME) IV 2 g (08/17/20 1019)   lactated ringers Stopped (08/16/20 2144)    Procedures/Studies: CT CHEST W CONTRAST  Result Date: 08/31/2020 CLINICAL DATA:  Possible sepsis, recent diagnosis of lung cancer EXAM: CT CHEST WITH CONTRAST TECHNIQUE: Multidetector CT imaging of the chest was performed during intravenous contrast administration. CONTRAST:  48mL OMNIPAQUE IOHEXOL 350 MG/ML SOLN COMPARISON:  Radiograph 08/18/2020, CT 02/22/2018 FINDINGS: Cardiovascular: Normal cardiac size. Three-vessel coronary artery atherosclerosis. Dense calcification in the aortic leaflets. No pericardial effusion. Atherosclerotic plaque within the normal caliber aorta. No acute aortic abnormality is evident. Normal 3 vessel branching of the mildly calcified proximal great vessels without acute vascular abnormality. Central pulmonary arteries are normal caliber. Narrowing of the hilar airways and vessels entering the right upper lobe in the region of the large right upper lobe mass. Mediastinum/Nodes: The right hilar structures are closely abutted and partially obscured by large heterogeneous mass lesion which appears predominantly centered in the right upper lobe. Notable narrowing and occlusion of the right upper lobe airways and vessels. Mediastinal adenopathy is seen including a 14 mm precarinal node (2/61) and a 10 mm subcarinal node (2/74). No demonstrable left hilar adenopathy or concerning axillary adenopathy. No acute abnormality more central trachea or thoracic esophagus. Thyroid gland and thoracic inlet are unremarkable. Lungs/Pleura: Large heterogeneous enhancing lesion centered in the right upper lobe measuring 10.8 x 9.4 x 10.8 cm in size with more hypoattenuating central regions which could reflect some central necrosis. This distorts and occludes the closely approximated high right hilar vessels and airways with essentially complete occlusion of the right upper lobe bronchus and  extensive surrounding airless lung. Additional areas of projection into both the posterior right middle lobe (5/38 and right lower lobe (6/34). Regions of airless lung abutting this mass lesion demonstrate heterogeneous parenchymal enhancement which could reflect postobstructive pneumonia or direct extension of the mass lesion, difficult to fully ascertain. Airways  thickening as well as basilar predominant interstitial opacities and basilar density in both lower lobes are more nonspecific. Background of centrilobular and paraseptal emphysematous changes. Upper Abdomen: Periportal edematous changes, nonspecific. No visible focal liver lesion within the limitations of this exam. No other acute or suspicious upper abdominal abnormalities. Musculoskeletal: No suspicious lytic or blastic lesions. Multilevel degenerative changes are present in the imaged portions of the spine. IMPRESSION: Heterogeneously enhancing mass lesion seen in the right upper lobe with some central hypoattenuation, possibly necrotic change. Closely approximating the right hilar structures with narrowing of the airways and vessels in this vicinity. Consistent with patient's reported primary lung malignancy. Heterogeneous enhancement of the surrounding airless lung parenchyma the left upper lobe, can reflect some direct extension and or postobstructive pneumonia particularly clinical features of sepsis. Additional airways thickening, interstitial opacity and basilar consolidation, more nonspecific and could reflect infection, edema versus lymphangitic spread. Mediastinal adenopathy, concerning for metastatic disease. Aortic Atherosclerosis (ICD10-I70.0). Emphysema (ICD10-J43.9). These results were called by telephone at the time of interpretation on 08/18/2020 at 4:02 am to provider Samanthamarie Ezzell ORTIZ , who verbally acknowledged these results. Electronically Signed   By: Lovena Le M.D.   On: 08/17/2020 04:02   DG Chest Port 1 View  Result Date:  08/15/2020 CLINICAL DATA:  Questionable sepsis EXAM: PORTABLE CHEST 1 VIEW COMPARISON:  02/18/2018 FINDINGS: Complete opacification of the right upper lung/lobe. This could reflect infiltrate or mass. No confluent opacity on the left. No visible effusions. Mild hyperinflation/COPD. Heart is normal size. IMPRESSION: Complete opacification of the right upper lobe. This could reflect pneumonia or mass. Postobstructive process cannot be excluded. Consider further evaluation with chest CT with IV contrast. Electronically Signed   By: Rolm Baptise M.D.   On: 08/09/2020 01:19    Orson Eva, DO  [facility]  If 7PM-7AM, please contact night-coverage [email or web address] Password [email or web address] 08/17/2020, 11:32 AM   LOS: 3 days

## 2020-08-17 NOTE — Progress Notes (Signed)
Patient having frequent runs of SVT into the 190s lasting a few seconds, pt coverts back to low sinus tach quickly. Dr. Carles Collet and Bailey Mech, NP notified. Kasi to bedside to continue palliative care discussion and plan of care. Repeat chest x-ray ordered per family request.

## 2020-08-17 NOTE — Progress Notes (Signed)
Received referral during progression and then received [place] consult from [place] in ICU. Received information from nurse about Palliative conversation with physician and Palliative NP earlier today. Met with family, introduced self and engaged them in life review around Robert [name]'s upbringing in [place], his work with mobile homes, his relationships, and spiritual heritage.  His 2 sons and daughter were present in the room along with a nephew and former wife. Chaplain provided space for them to reflect and they engaged well today. Sons are more accepting of his prognosis than daughter. The daughter has been more involved in his day to day care and therefore a little hesitant regarding her own acceptance of Robert [name]'s medical decline. Chaplain provided prayer and support. Will continue to remain available in order to provide spiritual support and to assess for spiritual need.   Rev. Bennie Pierini, M.Div

## 2020-08-17 NOTE — Progress Notes (Signed)
OT Cancellation Note  Patient Details Name: OLEG OLESON MRN: [id number] DOB: 15-May-1955   Cancelled Treatment:    Reason Eval/Treat Not Completed: Patient not medically ready;Medical issues which prohibited therapy. Pt on bipap with worsening condition per document review and discussion with RN. Pt will be removed from OT list. Re-order OT if pt condition improves.   Summer Parthasarathy OT, MOT  Larey Seat 08/17/2020, 9:36 AM

## 2020-08-17 NOTE — Progress Notes (Signed)
Daily Progress Note   Patient Name: Robert [name]       Date: 08/17/2020 DOB: 1955/02/13  Age: 65 y.o. MRN#: [id number] Attending Physician: Orson Eva, MD Primary Care Physician: [facility] Date: 08/21/2020  Reason for Consultation/Follow-up: Establishing goals of care  Subjective: Family at bedside this morning. Continuing full scope treatment at this time. They are struggling among themselves regarding decision for comfort care- emotional support given. Answered questions related to his overall prognosis. Family requested repeat chest xray.  He is having frequent runs of SVTs which are becoming longer.   Review of Systems  Unable to perform ROS: Acuity of condition   Length of Stay: 3  Current Medications: Scheduled Meds:   sodium chloride   Intravenous Once   aspirin EC  81 mg Oral Daily   busPIRone  5 mg Oral BID   Chlorhexidine Gluconate Cloth  6 each Topical Daily   DULoxetine  30 mg Oral BID   enoxaparin (LOVENOX) injection  40 mg Subcutaneous Q24H   feeding supplement  237 mL Oral BID BM   ferrous sulfate  325 mg Oral Q breakfast   folic acid  1 mg Intravenous Daily   guaiFENesin  600 mg Oral BID   ipratropium-albuterol  3 mL Nebulization Q6H   lidocaine  1 patch Transdermal Q24H   LORazepam  0-4 mg Intravenous Q6H   Followed by   Derrill Memo ON 08/18/2020] LORazepam  0-4 mg Intravenous Q12H   mouth rinse  15 mL Mouth Rinse BID   methylPREDNISolone (SOLU-MEDROL) injection  60 mg Intravenous Q12H   metoprolol tartrate  2.5 mg Intravenous Q6H   mirtazapine  7.5 mg Oral QHS   mometasone-formoterol  2 puff Inhalation BID   multivitamin with minerals  1 tablet Oral Daily   QUEtiapine  25 mg Oral BID   thiamine  100 mg Oral Daily   Or    thiamine  100 mg Intravenous Daily    Continuous Infusions:  azithromycin Stopped (08/17/20 0435)   ceFEPime (MAXIPIME) IV 2 g (08/17/20 1019)   lactated ringers Stopped (08/16/20 2144)    PRN Meds: acetaminophen **OR** acetaminophen, diphenhydrAMINE, LORazepam **OR** LORazepam, morphine injection, morphine CONCENTRATE, ondansetron **OR** ondansetron (ZOFRAN) IV  Physical Exam Vitals and nursing note reviewed.  Constitutional:  General: He is in acute distress.     Appearance: He is ill-appearing.  Cardiovascular:     Rate and Rhythm: Tachycardia present. Rhythm irregular.  Pulmonary:     Comments: Increased effort, accessory muscle useage Skin:    Comments: Diphoretic at times   Neurological:     Comments: Lethargic, agitated at times            Vital Signs: BP 125/70   Pulse (!) 124   Temp 98 F (36.7 C) (Axillary)   Resp (!) 26   Ht 5\' 8"  (1.727 m)   Wt 66.1 kg   SpO2 94%   BMI 22.16 kg/m  SpO2: SpO2: 94 % O2 Device: O2 Device: Bi-PAP O2 Flow Rate: O2 Flow Rate (L/min): 7 L/min  Intake/output summary:  Intake/Output Summary (Last 24 hours) at 08/17/2020 1221 Last data filed at 08/17/2020 1211 Gross per 24 hour  Intake 2073.47 ml  Output 3075 ml  Net -1001.53 ml   LBM: Last BM Date: 09/06/2020 Baseline Weight: Weight: 62.7 kg Most recent weight: Weight: 66.1 kg       Palliative Assessment/Data: PPS: 10%      Patient Active Problem List   Diagnosis Date Noted   Lung mass    Sepsis with acute hypoxic respiratory failure without septic shock (HCC)    Malnutrition of moderate degree 08/16/2020   Right upper lobe consolidation ([place]) 09/06/2020   Normocytic anemia 09/03/2020   History of lung cancer 09/01/2020   Aortic atherosclerosis ([place]) 09/02/2020   Acute respiratory failure with hypoxia ([place]) 08/29/2020   Goals of care, counseling/discussion 02/22/2018   Chronic respiratory failure with hypoxia ([place]) 02/19/2018   Acute on chronic respiratory  failure with hypoxia and hypercapnia (HCC)    Delirium tremens ([place]) 04/18/2017   Acute on chronic respiratory failure with hypoxia (HCC) 04/17/2017   Tobacco abuse 04/17/2017   TIA (transient ischemic attack) 02/16/2016   COPD exacerbation ([place]) 10/31/2015   COPD (chronic obstructive pulmonary disease) (HCC)    High cholesterol    PTSD (post-traumatic stress disorder)    Alcohol addiction ([place])    Bipolar 1 disorder [facility])     Palliative Care Assessment & Plan   Patient Profile:  65 y.o. 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 There was concern for lymphangitic spread of cancer vs infection, edema on CT scan.  Assessment/Recommendations/Plan  Patient continues to decline, has ongoing anemia, in not awake, alert, not eating or drinking, continues to require bipap Recommend continue with current interventions as well as current comfort interventions of lorazepam prn and morphine prn as ordered Family requests repeat Chest xray and continued discussion with patient's attending MD PMT will be available to family as needed   Code Status: DNR  Prognosis:  Hours - Days if he continues to decline regardless of current interventions  Discharge Planning: Anticipated Hospital Death  Care plan was discussed with patient's family and care team.  Thank you for allowing the Palliative Medicine Team to assist in the care of this patient.   Total time: 46 minutes Greater than 50%  of this time was spent counseling and coordinating care related to the above assessment and plan.  Mariana Kaufman, AGNP-C Palliative Medicine   Please contact Palliative Medicine Team phone at [phone] for questions and concerns.

## 2020-08-18 DIAGNOSIS — J181 Lobar pneumonia, unspecified organism: Secondary | ICD-10-CM

## 2020-08-18 DIAGNOSIS — J9621 Acute and chronic respiratory failure with hypoxia: Secondary | ICD-10-CM | POA: Diagnosis not present

## 2020-08-18 DIAGNOSIS — F319 Bipolar disorder, unspecified: Secondary | ICD-10-CM | POA: Diagnosis not present

## 2020-08-18 DIAGNOSIS — J441 Chronic obstructive pulmonary disease with (acute) exacerbation: Secondary | ICD-10-CM | POA: Diagnosis not present

## 2020-08-18 DIAGNOSIS — J9601 Acute respiratory failure with hypoxia: Secondary | ICD-10-CM | POA: Diagnosis not present

## 2020-08-18 DIAGNOSIS — L8990 Pressure ulcer of unspecified site, unspecified stage: Secondary | ICD-10-CM | POA: Insufficient documentation

## 2020-08-18 LAB — CBC
HCT: 31.9 % — ABNORMAL LOW (ref 39.0–52.0)
Hemoglobin: 9.5 g/dL — ABNORMAL LOW (ref 13.0–17.0)
MCH: 25.1 pg — ABNORMAL LOW (ref 26.0–34.0)
MCHC: 29.8 g/dL — ABNORMAL LOW (ref 30.0–36.0)
MCV: 84.4 fL (ref 80.0–100.0)
Platelets: 513 10*3/uL — ABNORMAL HIGH (ref 150–400)
RBC: 3.78 MIL/uL — ABNORMAL LOW (ref 4.22–5.81)
RDW: 22.8 % — ABNORMAL HIGH (ref 11.5–15.5)
WBC: 17.1 10*3/uL — ABNORMAL HIGH (ref 4.0–10.5)
nRBC: 0 % (ref 0.0–0.2)

## 2020-08-18 LAB — COMPREHENSIVE METABOLIC PANEL
ALT: 14 U/L (ref 0–44)
AST: 11 U/L — ABNORMAL LOW (ref 15–41)
Albumin: 2.4 g/dL — ABNORMAL LOW (ref 3.5–5.0)
Alkaline Phosphatase: 74 U/L (ref 38–126)
Anion gap: 14 (ref 5–15)
BUN: 20 mg/dL (ref 8–23)
CO2: 34 mmol/L — ABNORMAL HIGH (ref 22–32)
Calcium: 11.5 mg/dL — ABNORMAL HIGH (ref 8.9–10.3)
Chloride: 87 mmol/L — ABNORMAL LOW (ref 98–111)
Creatinine, Ser: 0.53 mg/dL — ABNORMAL LOW (ref 0.61–1.24)
GFR, Estimated: >60 mL/min (ref >60)
Glucose, Bld: 127 mg/dL — ABNORMAL HIGH (ref 70–99)
Potassium: 4.2 mmol/L (ref 3.5–5.1)
Sodium: 135 mmol/L (ref 135–145)
Total Bilirubin: 1.4 mg/dL — ABNORMAL HIGH (ref 0.3–1.2)
Total Protein: 6.4 g/dL — ABNORMAL LOW (ref 6.5–8.1)

## 2020-08-18 LAB — BLOOD GAS, ARTERIAL
Acid-Base Excess: 13.1 mmol/L — ABNORMAL HIGH (ref 0.0–2.0)
Bicarbonate: 36.1 mmol/L — ABNORMAL HIGH (ref 20.0–28.0)
FIO2: 60
O2 Saturation: 95.2 %
Patient temperature: 37.1
pCO2 arterial: 58.5 mmHg — ABNORMAL HIGH (ref 32.0–48.0)
pH, Arterial: 7.43 (ref 7.350–7.450)
pO2, Arterial: 79 mmHg — ABNORMAL LOW (ref 83.0–108.0)

## 2020-08-18 LAB — IRON AND TIBC
Iron: 15 ug/dL — ABNORMAL LOW (ref 45–182)
Saturation Ratios: 8 % — ABNORMAL LOW (ref 17.9–39.5)
TIBC: 192 ug/dL — ABNORMAL LOW (ref 250–450)
UIBC: 177 ug/dL

## 2020-08-18 LAB — VITAMIN B12: Vitamin B-12: 286 pg/mL (ref 180–914)

## 2020-08-18 LAB — FERRITIN: Ferritin: 493 ng/mL — ABNORMAL HIGH (ref 24–336)

## 2020-08-18 LAB — PROCALCITONIN: Procalcitonin: 0.5 ng/mL

## 2020-08-18 LAB — VITAMIN D 25 HYDROXY (VIT D DEFICIENCY, FRACTURES): Vit D, 25-Hydroxy: 9.38 ng/mL — ABNORMAL LOW (ref 30–100)

## 2020-08-18 LAB — BRAIN NATRIURETIC PEPTIDE: B Natriuretic Peptide: 325 pg/mL — ABNORMAL HIGH (ref 0.0–100.0)

## 2020-08-18 MED ORDER — SODIUM CHLORIDE 0.9 % IV SOLN: INTRAVENOUS | Status: DC

## 2020-08-18 MED ORDER — BUDESONIDE 0.5 MG/2ML IN SUSP
0.5000 mg | Freq: Two times a day (BID) | RESPIRATORY_TRACT | Status: DC
  Administered 2020-08-18 – 2020-08-20 (×4): 0.5 mg via RESPIRATORY_TRACT
  Filled 2020-08-18 (×4): qty 2

## 2020-08-18 MED ORDER — ASPIRIN 300 MG RE SUPP
300.0000 mg | Freq: Every day | RECTAL | Status: DC
  Administered 2020-08-18 – 2020-08-19 (×2): 300 mg via RECTAL
  Filled 2020-08-18 (×3): qty 1

## 2020-08-18 NOTE — Progress Notes (Signed)
Dr Tat made aware that iron lab panel resulted in chart.

## 2020-08-18 NOTE — Progress Notes (Signed)
Daughter present at bedside, took patient off Bipap and placed on HFNC at 6 Liters per verbal from Dr Tat. Respiratory aware. Patient calm and does not appear in any distress at this time. Will continue to monitor.

## 2020-08-18 NOTE — Progress Notes (Signed)
PROGRESS NOTE  Robert [name]:[id number] DOB: [birth year] DOA: 09/01/2020 PCP: [facility]  Brief History:  65 y/o male with a history of COPD, chronic respiratory failure on 3 L, anxiety, tobacco abuse, alcohol abuse, bipolar disorder, PTSD, HLD, recently diagnosed Lung Cancer brought to the emergency department via EMS due to dyspnea.  He was recently discharged from [facility], [place] due to sepsis secondary to pneumonia in the setting of newly diagnosed lung cancer.  He received 125 mg of Solu-Medrol IV PE, 2 duo nebs and an albuterol neb in route.  He is currently on BiPAP and unable to provide much information.  He denied having any headache, chest, back or abdominal pain at this time.   ED Course: Initial vital signs were temperature 103.6 F, pulse 01/09/1932, respirations 37, BP 116/50 mmHg and O2 sat 98% on NRB oxygen.  The patient was going to be intubated by Dr. Wyvonnia Dusky but he declined and wants to be DNR.  This was confirmed to me while I was examining him.  He did receive acetaminophen 650 mg rectally, 3000 mL of IV fluid bolus, magnesium sulfate 2 g IVPB, no other DuoNeb, cefepime and vancomycin.     Assessment/Plan: Sepsis -present on admission -presented with fever, tachycardia, tachypnea, leukocytosis -continue cefepime -MRSA neg -due to pneumonia -PCT 0.50 -lactic peaked 1.9   Acute on chronic respiratory failure with hypoxia and hypercapnea -due to pneumonia and COPD exacerbation -remains on BiPAP -does not want to escalate care at this point and has requested DNR DNI status -GOC discussion continues daily with family -repeat ABG today   Lobar Pneumonia -continue cefepime -MRSA neg -personally reviewed CXR--RUL opacity, increased interstitial markings  Hypercalcemia -8/11 corrected calcium 12.8 -likely due to immobility and malignancy -intact PTH -PTHrp -25 vitamin D -1,25 vitamin D -start IVF   COPD  exacerbation -continue duonebs -start pulmicort -continue IV solumedrol   Newly diagnosed RUL malignancy  -was scheduled for outpatient follow up with PET scan and other tests.   -palliative medicine consultation for further goals of care discussions. -08/09/2020 CT chest with RUL mass with central hypoattenuation; additional airway thickening, interstitial opacity and basilar consolidation which may be edema, infection vs lymphangitic spread   Anemia in neoplastic disease -  -Hg down to 7.  Give 1 unit PRBC 8/9 -B12 -iron studies   Tobacco abuse -cessation discussed     SVT -intermittently up to 180-200 -continue IV lopressor   Bipolar disorder -unable to take cymbalta, buspar, remeron while on bipap   Goals of Care -.palliative medicine following -Advance care planning, including the explanation and discussion of advance directives was carried out with the patient and family.  Code status including explanations of "Full Code" and "DNR" and alternatives were discussed in detail.  Discussion of end-of-life issues including but not limited palliative care, hospice care and the concept of hospice, other end-of-life care options, power of attorney for health care decisions, living wills, and physician orders for life-sustaining treatment were also discussed with the patient and family.  Total face to face time 16 minutes.       Status is: Inpatient   Remains inpatient appropriate because:Inpatient level of care appropriate due to severity of illness   Dispo: The patient is from: Home              Anticipated d/c is to: Home  Patient currently is not medically stable to d/c.              Difficult to place patient No         The patient is critically ill with multiple organ systems failure and requires high complexity decision making for assessment and support, frequent evaluation and titration of therapies, application of advanced monitoring technologies and extensive  interpretation of multiple databases.  Critical care time - 35 mins.        Family Communication:   daily [place] discussions    Consultants:  palliative   Code Status: DNR   DVT Prophylaxis:  [place] Lovenox     Procedures: As Listed in Progress Note Above   Antibiotics: Cefepime 8/7>>     Subjective: Patient intermittently agitated.  Does not follow commands.  Remains on bipap.  No resp distress, vomiting, diarrhea  Objective: Vitals:   08/18/20 0440 08/18/20 0500 08/18/20 0515 08/18/20 0600  BP:  (!) 166/84 (!) 166/84 (!) 168/85  Pulse:  (!) 109 (!) 110 97  Resp:  (!) 24 (!) 25 (!) 24  Temp: 97.7 F (36.5 C)     TempSrc: Axillary     SpO2:  93% 97% 93%  Weight: 60.1 kg     Height:        Intake/Output Summary (Last 24 hours) at 08/18/2020 1025 Last data filed at 08/18/2020 0653 Gross per 24 hour  Intake 1180.59 ml  Output 925 ml  Net 255.59 ml   Weight change: -6 kg Exam:  General:  Pt is alert, does not follow commands appropriately, not in acute distress HEENT: No icterus, No thrush, No neck mass, [place]/AT Cardiovascular: RRR, S1/S2, no rubs, no gallops Respiratory: bilateral rales.  No wheeze.  Diminished BS R>L Abdomen: Soft/+BS, non tender, non distended, no guarding Extremities: No edema, No lymphangitis, No petechiae, No rashes, no synovitis   Data Reviewed: I have personally reviewed following labs and imaging studies Basic Metabolic Panel: Recent Labs  Lab 08/13/2020 0033 08/15/20 0447 08/16/20 0314 08/18/20 0430  NA 132* 136 139 135  K 4.4 4.5 4.2 4.2  CL 92* 98 99 87*  CO2 33* 33* 35* 34*  GLUCOSE 142* 124* 118* 127*  BUN 11 11 16 20   CREATININE 0.62 0.45* 0.56* 0.53*  CALCIUM 9.7 9.8 10.2 11.5*  MG  --  2.0 2.3  --    Liver Function Tests: Recent Labs  Lab 09/02/2020 0033 08/15/20 0447 08/18/20 0430  AST 14* 13* 11*  ALT 14 13 14   ALKPHOS 84 69 74  BILITOT 0.7 0.6 1.4*  PROT 7.0 5.7* 6.4*  ALBUMIN 2.6* 2.1* 2.4*   No results for  input(s): LIPASE, AMYLASE in the last 168 hours. No results for input(s): AMMONIA in the last 168 hours. Coagulation Profile: Recent Labs  Lab 08/27/2020 0033  INR 1.2   CBC: Recent Labs  Lab 08/30/2020 0033 08/15/20 0447 08/16/20 0314 08/18/20 0430  WBC 23.9* 15.1* 12.0* 17.1*  NEUTROABS 22.1*  --  10.7*  --   HGB 9.7* 7.7* 7.0* 9.5*  HCT 32.0* 26.0* 23.9* 31.9*  MCV 87.4 87.0 89.2 84.4  PLT 369 279 334 513*   Cardiac Enzymes: No results for input(s): CKTOTAL, CKMB, CKMBINDEX, TROPONINI in the last 168 hours. BNP: Invalid input(s): POCBNP CBG: No results for input(s): GLUCAP in the last 168 hours. HbA1C: No results for input(s): HGBA1C in the last 72 hours. Urine analysis:    Component Value Date/Time   COLORURINE YELLOW 08/19/2020  [place] 09/03/2020 0908   APPEARANCEUR Clear 11/14/2013 1034   LABSPEC 1.025 08/09/2020 0908   LABSPEC 1.003 11/14/2013 1034   PHURINE 6.0 08/31/2020 0908   GLUCOSEU NEGATIVE 09/04/2020 0908   GLUCOSEU Negative 11/14/2013 1034   HGBUR MODERATE (A) 08/13/2020 0908   BILIRUBINUR NEGATIVE 08/31/2020 0908   BILIRUBINUR Negative 11/14/2013 [place] 09/03/2020 0908   PROTEINUR NEGATIVE 08/08/2020 0908   NITRITE NEGATIVE 08/11/2020 0908   LEUKOCYTESUR NEGATIVE 08/17/2020 0908   LEUKOCYTESUR Negative 11/14/2013 1034   Sepsis Labs: @LABRCNTIP (procalcitonin:4,lacticidven:4) ) Recent Results (from the past 240 hour(s))  Blood Culture (routine x 2)     Status: None (Preliminary result)   Collection Time: 08/28/2020 12:28 AM   Specimen: Right Antecubital; Blood  Result Value Ref Range Status   Specimen Description RIGHT ANTECUBITAL  Final   Special Requests   Final    BOTTLES DRAWN AEROBIC AND ANAEROBIC Blood Culture adequate volume   Culture   Final    NO GROWTH 3 DAYS Performed at [facility], [address], [place], [place] [address]    Report Status PENDING  Incomplete  Blood Culture (routine x 2)      Status: None (Preliminary result)   Collection Time: 08/19/2020 12:30 AM   Specimen: BLOOD RIGHT FOREARM  Result Value Ref Range Status   Specimen Description BLOOD RIGHT FOREARM  Final   Special Requests   Final    BOTTLES DRAWN AEROBIC AND ANAEROBIC Blood Culture adequate volume   Culture   Final    NO GROWTH 3 DAYS Performed at [facility], [address], [place], [place] [address]    Report Status PENDING  Incomplete  Resp Panel by RT-PCR (Flu A&B, Covid) Nasopharyngeal Swab     Status: None   Collection Time: 09/04/2020 12:33 AM   Specimen: Nasopharyngeal Swab; Nasopharyngeal(NP) swabs in vial transport medium  Result Value Ref Range Status   SARS Coronavirus 2 by RT PCR NEGATIVE NEGATIVE Final    Comment: (NOTE) SARS-CoV-2 target nucleic acids are NOT DETECTED.  The SARS-CoV-2 RNA is generally detectable in upper respiratory specimens during the acute phase of infection. The lowest concentration of SARS-CoV-2 viral copies this assay can detect is 138 copies/mL. A negative result does not preclude SARS-Cov-2 infection and should not be used as the sole basis for treatment or other patient management decisions. A negative result may occur with  improper specimen collection/handling, submission of specimen other than nasopharyngeal swab, presence of viral mutation(s) within the areas targeted by this assay, and inadequate number of viral copies(<138 copies/mL). A negative result must be combined with clinical observations, patient history, and epidemiological information. The expected result is Negative.  Fact Sheet for Patients:  EntrepreneurPulse.com.au  Fact Sheet for Healthcare Providers:  IncredibleEmployment.be  This test is no t yet approved or cleared by the [place] FDA and  has been authorized for detection and/or diagnosis of SARS-CoV-2 by FDA under an Emergency Use Authorization (EUA). This EUA will remain  in effect  (meaning this test can be used) for the duration of the COVID-19 declaration under Section 564(b)(1) of the Act, 21 U.S.C.section 360bbb-3(b)(1), unless the authorization is terminated  or revoked sooner.       Influenza A by PCR NEGATIVE NEGATIVE Final   Influenza B by PCR NEGATIVE NEGATIVE Final    Comment: (NOTE) The Xpert Xpress SARS-CoV-2/FLU/RSV plus assay is intended as an aid in the diagnosis of influenza from Nasopharyngeal swab specimens and should  not be used as a sole basis for treatment. Nasal washings and aspirates are unacceptable for Xpert Xpress SARS-CoV-2/FLU/RSV testing.  Fact Sheet for Patients: EntrepreneurPulse.com.au  Fact Sheet for Healthcare Providers: IncredibleEmployment.be  This test is not yet approved or cleared by the [place] FDA and has been authorized for detection and/or diagnosis of SARS-CoV-2 by FDA under an Emergency Use Authorization (EUA). This EUA will remain in effect (meaning this test can be used) for the duration of the COVID-19 declaration under Section 564(b)(1) of the Act, 21 U.S.C. section 360bbb-3(b)(1), unless the authorization is terminated or revoked.  Performed at [facility], [address] [place], [place] [address]   MRSA Next Gen by PCR, Nasal     Status: None   Collection Time: 09/05/2020  9:07 AM   Specimen: Nasal Mucosa; Nasal Swab  Result Value Ref Range Status   MRSA by PCR Next Gen NOT DETECTED NOT DETECTED Final    Comment: (NOTE) The GeneXpert MRSA Assay (FDA approved for NASAL specimens only), is one component of a comprehensive MRSA colonization surveillance program. It is not intended to diagnose MRSA infection nor to guide or monitor treatment for MRSA infections. Test performance is not FDA approved in patients less than 19 years old. Performed at [facility], [address], [place], [place] [address]      Scheduled Meds:  sodium chloride   Intravenous Once    aspirin EC  81 mg Oral Daily   Chlorhexidine Gluconate Cloth  6 each Topical Daily   enoxaparin (LOVENOX) injection  40 mg Subcutaneous Q24H   feeding supplement  237 mL Oral BID BM   folic acid  1 mg Intravenous Daily   guaiFENesin  600 mg Oral BID   ipratropium-albuterol  3 mL Nebulization Q6H   lidocaine  1 patch Transdermal Q24H   LORazepam  0-4 mg Intravenous Q12H   mouth rinse  15 mL Mouth Rinse BID   methylPREDNISolone (SOLU-MEDROL) injection  60 mg Intravenous Q12H   metoprolol tartrate  2.5 mg Intravenous Q6H   multivitamin with minerals  1 tablet Oral Daily   thiamine  100 mg Oral Daily   Or   thiamine  100 mg Intravenous Daily   Continuous Infusions:  ceFEPime (MAXIPIME) IV Stopped (08/18/20 0058)   lactated ringers Stopped (08/17/20 1019)    Procedures/Studies: CT CHEST W CONTRAST  Result Date: 08/18/2020 CLINICAL DATA:  Possible sepsis, recent diagnosis of lung cancer EXAM: CT CHEST WITH CONTRAST TECHNIQUE: Multidetector CT imaging of the chest was performed during intravenous contrast administration. CONTRAST:  17mL OMNIPAQUE IOHEXOL 350 MG/ML SOLN COMPARISON:  Radiograph 08/25/2020, CT 02/22/2018 FINDINGS: Cardiovascular: Normal cardiac size. Three-vessel coronary artery atherosclerosis. Dense calcification in the aortic leaflets. No pericardial effusion. Atherosclerotic plaque within the normal caliber aorta. No acute aortic abnormality is evident. Normal 3 vessel branching of the mildly calcified proximal great vessels without acute vascular abnormality. Central pulmonary arteries are normal caliber. Narrowing of the hilar airways and vessels entering the right upper lobe in the region of the large right upper lobe mass. Mediastinum/Nodes: The right hilar structures are closely abutted and partially obscured by large heterogeneous mass lesion which appears predominantly centered in the right upper lobe. Notable narrowing and occlusion of the right upper lobe airways and  vessels. Mediastinal adenopathy is seen including a 14 mm precarinal node (2/61) and a 10 mm subcarinal node (2/74). No demonstrable left hilar adenopathy or concerning axillary adenopathy. No acute abnormality more central trachea or thoracic esophagus. Thyroid  gland and thoracic inlet are unremarkable. Lungs/Pleura: Large heterogeneous enhancing lesion centered in the right upper lobe measuring 10.8 x 9.4 x 10.8 cm in size with more hypoattenuating central regions which could reflect some central necrosis. This distorts and occludes the closely approximated high right hilar vessels and airways with essentially complete occlusion of the right upper lobe bronchus and extensive surrounding airless lung. Additional areas of projection into both the posterior right middle lobe (5/38 and right lower lobe (6/34). Regions of airless lung abutting this mass lesion demonstrate heterogeneous parenchymal enhancement which could reflect postobstructive pneumonia or direct extension of the mass lesion, difficult to fully ascertain. Airways thickening as well as basilar predominant interstitial opacities and basilar density in both lower lobes are more nonspecific. Background of centrilobular and paraseptal emphysematous changes. Upper Abdomen: Periportal edematous changes, nonspecific. No visible focal liver lesion within the limitations of this exam. No other acute or suspicious upper abdominal abnormalities. Musculoskeletal: No suspicious lytic or blastic lesions. Multilevel degenerative changes are present in the imaged portions of the spine. IMPRESSION: Heterogeneously enhancing mass lesion seen in the right upper lobe with some central hypoattenuation, possibly necrotic change. Closely approximating the right hilar structures with narrowing of the airways and vessels in this vicinity. Consistent with patient's reported primary lung malignancy. Heterogeneous enhancement of the surrounding airless lung parenchyma the left  upper lobe, can reflect some direct extension and or postobstructive pneumonia particularly clinical features of sepsis. Additional airways thickening, interstitial opacity and basilar consolidation, more nonspecific and could reflect infection, edema versus lymphangitic spread. Mediastinal adenopathy, concerning for metastatic disease. Aortic Atherosclerosis (ICD10-I70.0). Emphysema (ICD10-J43.9). These results were called by telephone at the time of interpretation on 09/04/2020 at 4:02 am to provider Elanie Hammitt ORTIZ , who verbally acknowledged these results. Electronically Signed   By: Lovena Le M.D.   On: 08/21/2020 04:02   DG CHEST PORT 1 VIEW  Result Date: 08/17/2020 CLINICAL DATA:  Acute respiratory failure with hypoxia. EXAM: PORTABLE CHEST 1 VIEW COMPARISON:  August 14, 2020. FINDINGS: The heart size and mediastinal contours are within normal limits. No pneumothorax or pleural effusion is noted. Stable large right upper lobe opacity is noted most consistent with postobstructive atelectasis or pneumonia secondary to central malignancy or neoplasm. Mild left midlung opacity is noted concerning for possible infiltrate or atelectasis. The visualized skeletal structures are unremarkable. IMPRESSION: Stable large right upper lobe opacity is noted most consistent with postobstructive atelectasis or pneumonia secondary to central malignancy or neoplasm. Mild left midlung opacity is noted concerning for possible infiltrate or atelectasis. Electronically Signed   By: Marijo Conception M.D.   On: 08/17/2020 14:44   DG Chest Port 1 View  Result Date: 08/15/2020 CLINICAL DATA:  Questionable sepsis EXAM: PORTABLE CHEST 1 VIEW COMPARISON:  02/18/2018 FINDINGS: Complete opacification of the right upper lung/lobe. This could reflect infiltrate or mass. No confluent opacity on the left. No visible effusions. Mild hyperinflation/COPD. Heart is normal size. IMPRESSION: Complete opacification of the right upper lobe. This could  reflect pneumonia or mass. Postobstructive process cannot be excluded. Consider further evaluation with chest CT with IV contrast. Electronically Signed   By: Rolm Baptise M.D.   On: 08/18/2020 01:19    Orson Eva, DO  [facility]  If 7PM-7AM, please contact night-coverage [email or web address] Password [email or web address] 08/18/2020, 7:02 AM   LOS: 4 days

## 2020-08-18 NOTE — Progress Notes (Signed)
Daily Progress Note   Patient Name: Robert [name]       Date: 08/18/2020 DOB: July 11, 1955  Age: 65 y.o. MRN#: [id number] Attending Physician: Orson Eva, MD Primary Care Physician: [facility] Date: 08/25/2020  Reason for Consultation/Follow-up: Establishing goals of care  Patient Profile/HPI: 65 y.o. 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 There was concern for lymphangitic spread of cancer vs infection, edema on CT scan.  Subjective: Wille Glaser is lethargic. Off bipap but still requiring high amounts of oxygen- currently at 10L. He appears short of breath. He is not able to verbalize any answers to my questions. He shakes his head "no" when asked if he's in pain.  Spoke with sonAntony Haste. He shares that family is continuing to evaluate patient and choices. He notes that if were solely up to him that he is ready for patient to be comfortable and to stop life prolonging measures.  However, his younger siblings are having a harder time coming to a decision.  All siblings are trying to support one another and make the best decision for their father and for each other.   Review of Systems  Unable to perform ROS: Acuity of condition    Physical Exam Vitals and nursing note reviewed.  Constitutional:      Appearance: He is ill-appearing.     Comments: Looks uncomfortable  Pulmonary:     Comments: Increased effort, increased rate           Vital Signs: BP (!) 166/92   Pulse 85   Temp 98.2 F (36.8 C) (Axillary)   Resp 19   Ht 5\' 8"  (1.727  m)   Wt 60.1 kg   SpO2 92%   BMI 20.15 kg/m  SpO2: SpO2: 92 % O2 Device: O2 Device: Nasal Cannula O2 Flow Rate: O2 Flow Rate (L/min): 6 L/min  Intake/output summary:  Intake/Output Summary (Last 24 hours) at 08/18/2020 1539 Last data filed at 08/18/2020 0653 Gross per 24 hour  Intake 830.59 ml  Output 325 ml  Net 505.59 ml   LBM: Last BM Date: 08/22/2020 Baseline Weight: Weight:  62.7 kg Most recent weight: Weight: 60.1 kg       Palliative Assessment/Data: PPS: 10%      Patient Active Problem List   Diagnosis Date Noted   Lobar pneumonia ([place]) 08/18/2020   Hypercalcemia 08/18/2020   Pressure injury of skin 08/18/2020   Lung mass    Sepsis with acute hypoxic respiratory failure without septic shock (HCC)    Malnutrition of moderate degree 08/16/2020   Right upper lobe consolidation ([place]) 08/16/2020   Normocytic anemia 08/15/2020   History of lung cancer 08/12/2020   Aortic atherosclerosis ([place]) 08/17/2020   Acute respiratory failure with hypoxia ([place]) 08/13/2020   Goals of care, counseling/discussion 02/22/2018   Chronic respiratory failure with hypoxia ([place]) 02/19/2018   Acute on chronic respiratory failure with hypoxia and hypercapnia (HCC)    Delirium tremens ([place]) 04/18/2017   Acute on chronic respiratory failure with hypoxia (HCC) 04/17/2017   Tobacco abuse 04/17/2017   TIA (transient ischemic attack) 02/16/2016   COPD exacerbation ([place]) 10/31/2015   COPD (chronic obstructive pulmonary disease) (HCC)    High cholesterol    PTSD (post-traumatic stress disorder)    Alcohol addiction ([place])    Bipolar 1 disorder [facility])     Palliative Care Assessment & Plan    Assessment/Recommendations/Plan  Continue current care- I suspect this is going to have to play out- patient is likely to declare himself with significant improvement or, more likely, obvious decline over the weekend PMT will return to service on Monday and will meet with family again if patient is still  admitted and still full scope   Code Status: DNR  Prognosis:  < 2 weeks due to significant lung cancer in the setting of advanced COPD- likely lymphangitic spread of cancer greatly affecting patient's respiratory status- in his current state he would not be able to tolerate any cancer therapies. He is likely at end of life despite all continued medical interventions.  Discharge Planning: To Be Determined  Care plan was discussed with Dr. Carles Collet and patient's son- no other family members were at bedside.   Thank you for allowing the Palliative Medicine Team to assist in the care of this patient.  Total time: 48 minutes Prolonged billing: No     Greater than 50%  of this time was spent counseling and coordinating care related to the above assessment and plan.  Mariana Kaufman, AGNP-C Palliative Medicine   Please contact Palliative Medicine Team phone at [phone] for questions and concerns.

## 2020-08-18 NOTE — Plan of Care (Signed)

## 2020-08-18 NOTE — Progress Notes (Addendum)
PT Cancellation Note  Patient Details Name: Robert [name] MRN: [id number] DOB: 12/20/1955   Cancelled Treatment:    Reason Eval/Treat Not Completed: Medical issues which prohibited therapy. Per nursing, patient's condition worsened Monday evening ad required BiPAP. Patient currently on trial off BiPAP at 6 LPM O2 supplementation. Patient is DNR. Nursing reports condition and progress are guarded at this time. Physical Therapy will discharge at this time. If/when patient's medical condition improves and PT appropriate, please submit another referral. Thank you for the referral.   Robert Raveling. Hartnett-Rands, MS, PT Per [place] [phone] Robert [name]  [name] 08/18/2020, 10:54 AM

## 2020-08-19 DIAGNOSIS — J181 Lobar pneumonia, unspecified organism: Secondary | ICD-10-CM | POA: Diagnosis not present

## 2020-08-19 DIAGNOSIS — J441 Chronic obstructive pulmonary disease with (acute) exacerbation: Secondary | ICD-10-CM | POA: Diagnosis not present

## 2020-08-19 DIAGNOSIS — J9621 Acute and chronic respiratory failure with hypoxia: Secondary | ICD-10-CM | POA: Diagnosis not present

## 2020-08-19 LAB — COMPREHENSIVE METABOLIC PANEL
ALT: 12 U/L (ref 0–44)
AST: 11 U/L — ABNORMAL LOW (ref 15–41)
Albumin: 2.4 g/dL — ABNORMAL LOW (ref 3.5–5.0)
Alkaline Phosphatase: 67 U/L (ref 38–126)
Anion gap: 12 (ref 5–15)
BUN: 28 mg/dL — ABNORMAL HIGH (ref 8–23)
CO2: 37 mmol/L — ABNORMAL HIGH (ref 22–32)
Calcium: 12.5 mg/dL — ABNORMAL HIGH (ref 8.9–10.3)
Chloride: 89 mmol/L — ABNORMAL LOW (ref 98–111)
Creatinine, Ser: 0.56 mg/dL — ABNORMAL LOW (ref 0.61–1.24)
GFR, Estimated: >60 mL/min (ref >60)
Glucose, Bld: 158 mg/dL — ABNORMAL HIGH (ref 70–99)
Potassium: 4.1 mmol/L (ref 3.5–5.1)
Sodium: 138 mmol/L (ref 135–145)
Total Bilirubin: 0.7 mg/dL (ref 0.3–1.2)
Total Protein: 6.2 g/dL — ABNORMAL LOW (ref 6.5–8.1)

## 2020-08-19 LAB — CBC
HCT: 31 % — ABNORMAL LOW (ref 39.0–52.0)
Hemoglobin: 9.2 g/dL — ABNORMAL LOW (ref 13.0–17.0)
MCH: 25.2 pg — ABNORMAL LOW (ref 26.0–34.0)
MCHC: 29.7 g/dL — ABNORMAL LOW (ref 30.0–36.0)
MCV: 84.9 fL (ref 80.0–100.0)
Platelets: 564 10*3/uL — ABNORMAL HIGH (ref 150–400)
RBC: 3.65 MIL/uL — ABNORMAL LOW (ref 4.22–5.81)
RDW: 22.5 % — ABNORMAL HIGH (ref 11.5–15.5)
WBC: 14.8 10*3/uL — ABNORMAL HIGH (ref 4.0–10.5)
nRBC: 0 % (ref 0.0–0.2)

## 2020-08-19 LAB — PARATHYROID HORMONE, INTACT (NO CA): PTH: 9 pg/mL — ABNORMAL LOW (ref 15–65)

## 2020-08-19 LAB — CULTURE, BLOOD (ROUTINE X 2)
Culture: NO GROWTH
Culture: NO GROWTH
Special Requests: ADEQUATE
Special Requests: ADEQUATE

## 2020-08-19 LAB — PROCALCITONIN: Procalcitonin: 0.6 ng/mL

## 2020-08-19 LAB — CALCITRIOL (1,25 DI-OH VIT D): Vit D, 1,25-Dihydroxy: 25.8 pg/mL (ref 24.8–81.5)

## 2020-08-19 NOTE — Progress Notes (Signed)
PROGRESS NOTE  Robert [name] ZJQ:[id number] DOB: Oct 30, 1955 DOA: 09/06/2020 PCP: [facility]   Brief History:  65 y/o male with a history of COPD, chronic respiratory failure on 3 L, anxiety, tobacco abuse, alcohol abuse, bipolar disorder, PTSD, HLD, recently diagnosed Lung Cancer brought to the emergency department via EMS due to dyspnea.  He was recently discharged from [facility] [place] due to sepsis secondary to pneumonia in the setting of newly diagnosed lung cancer.  He received 125 mg of Solu-Medrol IV PE, 2 duo nebs and an albuterol neb in route.  He is currently on BiPAP and unable to provide much information.  He denied having any headache, chest, back or abdominal pain at this time.   ED Course: Initial vital signs were temperature 103.6 F, pulse 01/09/1932, respirations 37, BP 116/50 mmHg and O2 sat 98% on NRB oxygen.  The patient was going to be intubated by Dr. Wyvonnia Dusky but he declined and wants to be DNR.  This was confirmed to me while I was examining him.  He did receive acetaminophen 650 mg rectally, 3000 mL of IV fluid bolus, magnesium sulfate 2 g IVPB, no other DuoNeb, cefepime and vancomycin.     Assessment/Plan: Sepsis -present on admission -presented with fever, tachycardia, tachypnea, leukocytosis -continue cefepime -MRSA neg -due to pneumonia -PCT 0.50 -lactic peaked 1.9 -sepsis physiology resolved   Acute on chronic respiratory failure with hypoxia and hypercapnea -due to pneumonia and COPD exacerbation -remains on BiPAP -does not want to escalate care at this point and has requested DNR DNI status -GOC discussion continues daily with family -repeat ABG 8/11--7.43/58/79/36 on 0.6 -tolerated HFNC about 10 hours 8/11-->back on BiPAP overnight 8/11-8/12 -continue BiPAP wean if GOC do not change with family   Lobar Pneumonia -continue cefepime -MRSA neg -personally reviewed CXR--RUL opacity, increased interstitial  markings   Hypercalcemia -8/11 corrected calcium 12.8 -likely due to immobility and malignancy -intact PTH--9 -PTHrp--pending -25 vitamin D--9.38 -1,25 vitamin D--pending -started IVF -start [place] calcitonin   COPD exacerbation -continue duonebs -continue pulmicort -continue IV solumedrol   Newly diagnosed RUL malignancy  -was scheduled for outpatient follow up with PET scan and other tests.   -palliative medicine consultation for further goals of care discussions. -08/13/2020 CT chest with RUL mass with central hypoattenuation; additional airway thickening, interstitial opacity and basilar consolidation which may be edema, infection vs lymphangitic spread   Anemia in neoplastic disease -  -Hg down to 7.  Given 1 unit PRBC 8/9 -B12--286 -iron studies--iron sat 9 %; ferritin 493   Tobacco abuse -cessation discussed     SVT -intermittently up to 180-200 -continue IV lopressor   Bipolar disorder -unable to take cymbalta, buspar, remeron while on bipap   Goals of Care -.palliative medicine following -Advance care planning, including the explanation and discussion of advance directives was carried out with the patient and family.  Code status including explanations of "Full Code" and "DNR" and alternatives were discussed in detail.  Discussion of end-of-life issues including but not limited palliative care, hospice care and the concept of hospice, other end-of-life care options, power of attorney for health care decisions, living wills, and physician orders for life-sustaining treatment were also discussed with the patient and family.  Total face to face time 16 minutes.       Status is: Inpatient   Remains inpatient appropriate because:Inpatient level of care appropriate due to severity of illness  Dispo: The patient is from: Home              Anticipated d/c is to: Home              Patient currently is not medically stable to d/c.              Difficult to place patient No                   Family Communication:   daily [place] discussions-daughter updated 8/12   Consultants:  palliative   Code Status: DNR   DVT Prophylaxis:  [place] Lovenox     Procedures: As Listed in Progress Note Above   Antibiotics: Cefepime 8/7>>        Subjective:  Patient is confused, not answering questions.  Remains on BiPAP.  No vomiting or diarrhea.  Placed back on BiPAP overnight.   Objective: Vitals:   08/19/20 0259 08/19/20 0300 08/19/20 0400 08/19/20 0700  BP:  (!) 179/112 (!) 165/85 (!) 179/80  Pulse:  90 82 67  Resp:  20 (!) 22 (!) 21  Temp:      TempSrc:      SpO2: 100% 100% 96% 99%  Weight:      Height:        Intake/Output Summary (Last 24 hours) at 08/19/2020 0801 Last data filed at 08/19/2020 0744 Gross per 24 hour  Intake 1987.05 ml  Output 2325 ml  Net -337.95 ml   Weight change:  Exam:  General:  Pt is awakens on Bipap.  Does not follow commands. HEENT: No icterus, [place]/AT Cardiovascular: RRR, S1/S2, no rubs, no gallops Respiratory: diminished BS bilateral.  Bilateral rales. Abdomen: Soft/+BS, non tender, non distended, no guarding Extremities: No edema, No lymphangitis, No petechiae, No rashes, no synovitis   Data Reviewed: I have personally reviewed following labs and imaging studies Basic Metabolic Panel: Recent Labs  Lab 08/31/2020 0033 08/15/20 0447 08/16/20 0314 08/18/20 0430 08/19/20 0356  NA 132* 136 139 135 138  K 4.4 4.5 4.2 4.2 4.1  CL 92* 98 99 87* 89*  CO2 33* 33* 35* 34* 37*  GLUCOSE 142* 124* 118* 127* 158*  BUN 11 11 16 20  28*  CREATININE 0.62 0.45* 0.56* 0.53* 0.56*  CALCIUM 9.7 9.8 10.2 11.5* 12.5*  MG  --  2.0 2.3  --   --    Liver Function Tests: Recent Labs  Lab 08/19/2020 0033 08/15/20 0447 08/18/20 0430 08/19/20 0356  AST 14* 13* 11* 11*  ALT 14 13 14 12   ALKPHOS 84 69 74 67  BILITOT 0.7 0.6 1.4* 0.7  PROT 7.0 5.7* 6.4* 6.2*  ALBUMIN 2.6* 2.1* 2.4* 2.4*   No results for input(s): LIPASE,  AMYLASE in the last 168 hours. No results for input(s): AMMONIA in the last 168 hours. Coagulation Profile: Recent Labs  Lab 08/28/2020 0033  INR 1.2   CBC: Recent Labs  Lab 08/28/2020 0033 08/15/20 0447 08/16/20 0314 08/18/20 0430 08/19/20 0356  WBC 23.9* 15.1* 12.0* 17.1* 14.8*  NEUTROABS 22.1*  --  10.7*  --   --   HGB 9.7* 7.7* 7.0* 9.5* 9.2*  HCT 32.0* 26.0* 23.9* 31.9* 31.0*  MCV 87.4 87.0 89.2 84.4 84.9  PLT 369 279 334 513* 564*   Cardiac Enzymes: No results for input(s): CKTOTAL, CKMB, CKMBINDEX, TROPONINI in the last 168 hours. BNP: Invalid input(s): POCBNP CBG: No results for input(s): GLUCAP in the last 168 hours. HbA1C: No results for input(s): HGBA1C in  the last 72 hours. Urine analysis:    Component Value Date/Time   COLORURINE YELLOW 08/19/2020 0908   APPEARANCEUR CLEAR 08/29/2020 0908   APPEARANCEUR Clear 11/14/2013 1034   LABSPEC 1.025 08/12/2020 0908   LABSPEC 1.003 11/14/2013 1034   PHURINE 6.0 09/05/2020 0908   GLUCOSEU NEGATIVE 08/22/2020 0908   GLUCOSEU Negative 11/14/2013 1034   HGBUR MODERATE (A) 08/30/2020 0908   BILIRUBINUR NEGATIVE 08/30/2020 0908   BILIRUBINUR Negative 11/14/2013 [place] 08/16/2020 0908   PROTEINUR NEGATIVE 08/08/2020 0908   NITRITE NEGATIVE 08/17/2020 0908   LEUKOCYTESUR NEGATIVE 08/30/2020 0908   LEUKOCYTESUR Negative 11/14/2013 1034   Sepsis Labs: @LABRCNTIP (procalcitonin:4,lacticidven:4) ) Recent Results (from the past 240 hour(s))  Blood Culture (routine x 2)     Status: None   Collection Time: 08/21/2020 12:28 AM   Specimen: Right Antecubital; Blood  Result Value Ref Range Status   Specimen Description RIGHT ANTECUBITAL  Final   Special Requests   Final    BOTTLES DRAWN AEROBIC AND ANAEROBIC Blood Culture adequate volume   Culture   Final    NO GROWTH 5 DAYS Performed at [facility], [address] [place], [place] [address]    Report Status 08/19/2020 FINAL  Final  Blood Culture  (routine x 2)     Status: None   Collection Time: 09/04/2020 12:30 AM   Specimen: BLOOD RIGHT FOREARM  Result Value Ref Range Status   Specimen Description BLOOD RIGHT FOREARM  Final   Special Requests   Final    BOTTLES DRAWN AEROBIC AND ANAEROBIC Blood Culture adequate volume   Culture   Final    NO GROWTH 5 DAYS Performed at [facility], [address], [place], [place] [address]    Report Status 08/19/2020 FINAL  Final  Resp Panel by RT-PCR (Flu A&B, Covid) Nasopharyngeal Swab     Status: None   Collection Time: 08/19/2020 12:33 AM   Specimen: Nasopharyngeal Swab; Nasopharyngeal(NP) swabs in vial transport medium  Result Value Ref Range Status   SARS Coronavirus 2 by RT PCR NEGATIVE NEGATIVE Final    Comment: (NOTE) SARS-CoV-2 target nucleic acids are NOT DETECTED.  The SARS-CoV-2 RNA is generally detectable in upper respiratory specimens during the acute phase of infection. The lowest concentration of SARS-CoV-2 viral copies this assay can detect is 138 copies/mL. A negative result does not preclude SARS-Cov-2 infection and should not be used as the sole basis for treatment or other patient management decisions. A negative result may occur with  improper specimen collection/handling, submission of specimen other than nasopharyngeal swab, presence of viral mutation(s) within the areas targeted by this assay, and inadequate number of viral copies(<138 copies/mL). A negative result must be combined with clinical observations, patient history, and epidemiological information. The expected result is Negative.  Fact Sheet for Patients:  EntrepreneurPulse.com.au  Fact Sheet for Healthcare Providers:  IncredibleEmployment.be  This test is no t yet approved or cleared by the [place] FDA and  has been authorized for detection and/or diagnosis of SARS-CoV-2 by FDA under an Emergency Use Authorization (EUA). This EUA will remain  in effect  (meaning this test can be used) for the duration of the COVID-19 declaration under Section 564(b)(1) of the Act, 21 U.S.C.section 360bbb-3(b)(1), unless the authorization is terminated  or revoked sooner.       Influenza A by PCR NEGATIVE NEGATIVE Final   Influenza B by PCR NEGATIVE NEGATIVE Final    Comment: (NOTE) The Xpert Xpress SARS-CoV-2/FLU/RSV plus assay is  intended as an aid in the diagnosis of influenza from Nasopharyngeal swab specimens and should not be used as a sole basis for treatment. Nasal washings and aspirates are unacceptable for Xpert Xpress SARS-CoV-2/FLU/RSV testing.  Fact Sheet for Patients: EntrepreneurPulse.com.au  Fact Sheet for Healthcare Providers: IncredibleEmployment.be  This test is not yet approved or cleared by the [place] FDA and has been authorized for detection and/or diagnosis of SARS-CoV-2 by FDA under an Emergency Use Authorization (EUA). This EUA will remain in effect (meaning this test can be used) for the duration of the COVID-19 declaration under Section 564(b)(1) of the Act, 21 U.S.C. section 360bbb-3(b)(1), unless the authorization is terminated or revoked.  Performed at [facility], [address], [place], [place] [address]   MRSA Next Gen by PCR, Nasal     Status: None   Collection Time: 09/05/2020  9:07 AM   Specimen: Nasal Mucosa; Nasal Swab  Result Value Ref Range Status   MRSA by PCR Next Gen NOT DETECTED NOT DETECTED Final    Comment: (NOTE) The GeneXpert MRSA Assay (FDA approved for NASAL specimens only), is one component of a comprehensive MRSA colonization surveillance program. It is not intended to diagnose MRSA infection nor to guide or monitor treatment for MRSA infections. Test performance is not FDA approved in patients less than 26 years old. Performed at [facility], [address], [place], [place] [address]      Scheduled Meds:  sodium chloride   Intravenous Once    aspirin  300 mg Rectal Daily   budesonide (PULMICORT) nebulizer solution  0.5 mg Nebulization BID   Chlorhexidine Gluconate Cloth  6 each Topical Daily   enoxaparin (LOVENOX) injection  40 mg Subcutaneous Q24H   feeding supplement  237 mL Oral BID BM   folic acid  1 mg Intravenous Daily   guaiFENesin  600 mg Oral BID   ipratropium-albuterol  3 mL Nebulization Q6H   lidocaine  1 patch Transdermal Q24H   LORazepam  0-4 mg Intravenous Q12H   mouth rinse  15 mL Mouth Rinse BID   methylPREDNISolone (SOLU-MEDROL) injection  60 mg Intravenous Q12H   metoprolol tartrate  2.5 mg Intravenous Q6H   thiamine  100 mg Oral Daily   Or   thiamine  100 mg Intravenous Daily   Continuous Infusions:  sodium chloride 75 mL/hr at 08/18/20 1609   ceFEPime (MAXIPIME) IV 2 g (08/19/20 0107)    Procedures/Studies: CT CHEST W CONTRAST  Result Date: 09/05/2020 CLINICAL DATA:  Possible sepsis, recent diagnosis of lung cancer EXAM: CT CHEST WITH CONTRAST TECHNIQUE: Multidetector CT imaging of the chest was performed during intravenous contrast administration. CONTRAST:  49mL OMNIPAQUE IOHEXOL 350 MG/ML SOLN COMPARISON:  Radiograph 08/20/2020, CT 02/22/2018 FINDINGS: Cardiovascular: Normal cardiac size. Three-vessel coronary artery atherosclerosis. Dense calcification in the aortic leaflets. No pericardial effusion. Atherosclerotic plaque within the normal caliber aorta. No acute aortic abnormality is evident. Normal 3 vessel branching of the mildly calcified proximal great vessels without acute vascular abnormality. Central pulmonary arteries are normal caliber. Narrowing of the hilar airways and vessels entering the right upper lobe in the region of the large right upper lobe mass. Mediastinum/Nodes: The right hilar structures are closely abutted and partially obscured by large heterogeneous mass lesion which appears predominantly centered in the right upper lobe. Notable narrowing and occlusion of the right upper lobe  airways and vessels. Mediastinal adenopathy is seen including a 14 mm precarinal node (2/61) and a 10 mm subcarinal node (2/74). No  demonstrable left hilar adenopathy or concerning axillary adenopathy. No acute abnormality more central trachea or thoracic esophagus. Thyroid gland and thoracic inlet are unremarkable. Lungs/Pleura: Large heterogeneous enhancing lesion centered in the right upper lobe measuring 10.8 x 9.4 x 10.8 cm in size with more hypoattenuating central regions which could reflect some central necrosis. This distorts and occludes the closely approximated high right hilar vessels and airways with essentially complete occlusion of the right upper lobe bronchus and extensive surrounding airless lung. Additional areas of projection into both the posterior right middle lobe (5/38 and right lower lobe (6/34). Regions of airless lung abutting this mass lesion demonstrate heterogeneous parenchymal enhancement which could reflect postobstructive pneumonia or direct extension of the mass lesion, difficult to fully ascertain. Airways thickening as well as basilar predominant interstitial opacities and basilar density in both lower lobes are more nonspecific. Background of centrilobular and paraseptal emphysematous changes. Upper Abdomen: Periportal edematous changes, nonspecific. No visible focal liver lesion within the limitations of this exam. No other acute or suspicious upper abdominal abnormalities. Musculoskeletal: No suspicious lytic or blastic lesions. Multilevel degenerative changes are present in the imaged portions of the spine. IMPRESSION: Heterogeneously enhancing mass lesion seen in the right upper lobe with some central hypoattenuation, possibly necrotic change. Closely approximating the right hilar structures with narrowing of the airways and vessels in this vicinity. Consistent with patient's reported primary lung malignancy. Heterogeneous enhancement of the surrounding airless lung parenchyma  the left upper lobe, can reflect some direct extension and or postobstructive pneumonia particularly clinical features of sepsis. Additional airways thickening, interstitial opacity and basilar consolidation, more nonspecific and could reflect infection, edema versus lymphangitic spread. Mediastinal adenopathy, concerning for metastatic disease. Aortic Atherosclerosis (ICD10-I70.0). Emphysema (ICD10-J43.9). These results were called by telephone at the time of interpretation on 08/29/2020 at 4:02 am to provider Chas[place] Yepiz ORTIZ , who verbally acknowledged these results. Electronically Signed   By: Lovena Le M.D.   On: 08/27/2020 04:02   DG CHEST PORT 1 VIEW  Result Date: 08/17/2020 CLINICAL DATA:  Acute respiratory failure with hypoxia. EXAM: PORTABLE CHEST 1 VIEW COMPARISON:  August 14, 2020. FINDINGS: The heart size and mediastinal contours are within normal limits. No pneumothorax or pleural effusion is noted. Stable large right upper lobe opacity is noted most consistent with postobstructive atelectasis or pneumonia secondary to central malignancy or neoplasm. Mild left midlung opacity is noted concerning for possible infiltrate or atelectasis. The visualized skeletal structures are unremarkable. IMPRESSION: Stable large right upper lobe opacity is noted most consistent with postobstructive atelectasis or pneumonia secondary to central malignancy or neoplasm. Mild left midlung opacity is noted concerning for possible infiltrate or atelectasis. Electronically Signed   By: Marijo Conception M.D.   On: 08/17/2020 14:44   DG Chest Port 1 View  Result Date: 08/19/2020 CLINICAL DATA:  Questionable sepsis EXAM: PORTABLE CHEST 1 VIEW COMPARISON:  02/18/2018 FINDINGS: Complete opacification of the right upper lung/lobe. This could reflect infiltrate or mass. No confluent opacity on the left. No visible effusions. Mild hyperinflation/COPD. Heart is normal size. IMPRESSION: Complete opacification of the right upper lobe.  This could reflect pneumonia or mass. Postobstructive process cannot be excluded. Consider further evaluation with chest CT with IV contrast. Electronically Signed   By: Rolm Baptise M.D.   On: 08/08/2020 01:19    Orson Eva, DO  [facility]  If 7PM-7AM, please contact night-coverage [email or web address] Password [email or web address] 08/19/2020, 8:01 AM   LOS: 5 days

## 2020-08-19 NOTE — Plan of Care (Signed)

## 2020-08-19 NOTE — Progress Notes (Signed)
Had patient weaned down to 40% FIO2 but patient had a period of desat.  Had to put patient back up to 60% to get back into 90s.  Patient currently at 60% with sat of 94%.

## 2020-08-20 DIAGNOSIS — J181 Lobar pneumonia, unspecified organism: Secondary | ICD-10-CM | POA: Diagnosis not present

## 2020-08-20 DIAGNOSIS — J9621 Acute and chronic respiratory failure with hypoxia: Secondary | ICD-10-CM | POA: Diagnosis not present

## 2020-08-20 DIAGNOSIS — J9601 Acute respiratory failure with hypoxia: Secondary | ICD-10-CM | POA: Diagnosis not present

## 2020-08-20 DIAGNOSIS — J441 Chronic obstructive pulmonary disease with (acute) exacerbation: Secondary | ICD-10-CM | POA: Diagnosis not present

## 2020-08-20 LAB — COMPREHENSIVE METABOLIC PANEL
ALT: 13 U/L (ref 0–44)
AST: 14 U/L — ABNORMAL LOW (ref 15–41)
Albumin: 2.6 g/dL — ABNORMAL LOW (ref 3.5–5.0)
Alkaline Phosphatase: 64 U/L (ref 38–126)
Anion gap: 5 (ref 5–15)
BUN: 29 mg/dL — ABNORMAL HIGH (ref 8–23)
CO2: 39 mmol/L — ABNORMAL HIGH (ref 22–32)
Calcium: 12.4 mg/dL — ABNORMAL HIGH (ref 8.9–10.3)
Chloride: 94 mmol/L — ABNORMAL LOW (ref 98–111)
Creatinine, Ser: 0.57 mg/dL — ABNORMAL LOW (ref 0.61–1.24)
GFR, Estimated: >60 mL/min (ref >60)
Glucose, Bld: 133 mg/dL — ABNORMAL HIGH (ref 70–99)
Potassium: 3.4 mmol/L — ABNORMAL LOW (ref 3.5–5.1)
Sodium: 138 mmol/L (ref 135–145)
Total Bilirubin: 0.7 mg/dL (ref 0.3–1.2)
Total Protein: 6.5 g/dL (ref 6.5–8.1)

## 2020-08-20 LAB — CBC
HCT: 31.1 % — ABNORMAL LOW (ref 39.0–52.0)
Hemoglobin: 9.1 g/dL — ABNORMAL LOW (ref 13.0–17.0)
MCH: 24.7 pg — ABNORMAL LOW (ref 26.0–34.0)
MCHC: 29.3 g/dL — ABNORMAL LOW (ref 30.0–36.0)
MCV: 84.5 fL (ref 80.0–100.0)
Platelets: 591 10*3/uL — ABNORMAL HIGH (ref 150–400)
RBC: 3.68 MIL/uL — ABNORMAL LOW (ref 4.22–5.81)
RDW: 22 % — ABNORMAL HIGH (ref 11.5–15.5)
WBC: 12.5 10*3/uL — ABNORMAL HIGH (ref 4.0–10.5)
nRBC: 0 % (ref 0.0–0.2)

## 2020-08-20 MED ORDER — LIDOCAINE VISCOUS HCL 2 % MT SOLN: 15.0000 mL | OROMUCOSAL | Status: DC | PRN

## 2020-08-20 NOTE — Progress Notes (Signed)
PROGRESS NOTE  Robert [name] LHT:[id number] DOB: 1955-01-16 DOA: 09/04/2020 PCP: [facility]  Brief History:  65 y/o male with a history of COPD, chronic respiratory failure on 3 L, anxiety, tobacco abuse, alcohol abuse, bipolar disorder, PTSD, HLD, recently diagnosed Lung Cancer brought to the emergency department via EMS due to dyspnea.  He was recently discharged from [facility], [place] due to sepsis secondary to pneumonia in the setting of newly diagnosed lung cancer.  He received 125 mg of Solu-Medrol IV PE, 2 duo nebs and an albuterol neb in route.  He is currently on BiPAP and unable to provide much information.  He denied having any headache, chest, back or abdominal pain at this time.   ED Course: Initial vital signs were temperature 103.6 F, pulse 01/09/1932, respirations 37, BP 116/50 mmHg and O2 sat 98% on NRB oxygen.  The patient was going to be intubated by Dr. Wyvonnia Dusky but he declined and wants to be DNR.  This was confirmed to me while I was examining him.  He did receive acetaminophen 650 mg rectally, 3000 mL of IV fluid bolus, magnesium sulfate 2 g IVPB, no other DuoNeb, cefepime and vancomycin. The patient was ultimately placed on bipap.  The patient had episodes of agitation and confusion.  Palliative medicine was consulted to help discuss [place].  The pt was slow to improve although sepsis physiology did improve.  He continued to need intermittent bipap.  Given his overall deconditioned and debilitated state, he certainly would not be a candidate for chemotherapy.  His prognosis for recovery to his pre-morbid state was poor in the setting of his multiple medical problems, deconditioning, and lung cancer with likelihood of lymphangitic spread.  This was discussed with his family through many [place] discussions.  Ultimately, the patient's sons and daughter decided to transition his focus of care to focus soley on his comfort.  Medications with curative  intent were discontinued.     Assessment/Plan: Sepsis -present on admission -presented with fever, tachycardia, tachypnea, leukocytosis -continue cefepime -MRSA neg -due to pneumonia -PCT 0.50 -lactic peaked 1.9 -sepsis physiology resolved   Acute on chronic respiratory failure with hypoxia and hypercapnea -due to pneumonia and COPD exacerbation -remains on BiPAP -does not want to escalate care at this point and has requested DNR DNI status -GOC discussion continues daily with family -repeat ABG 8/11--7.43/58/79/36 on 0.6 -tolerated HFNC about 10 hours 8/11-->back on BiPAP overnight 8/11-8/12 -08/20/20--family decided to transition to focus on full comfort-->will not restart BiPAP -morphine and ativan for anxiety and sob   Lobar Pneumonia -continue cefepime -MRSA neg -personally reviewed CXR--RUL opacity, increased interstitial markings -08/20/20--family decided to transition to focus on full comfort-->will not restart BiPAP   Hypercalcemia -8/11 corrected calcium 12.8 -likely due to immobility and malignancy -intact PTH--9 -PTHrp--pending -25 vitamin D--9.38 -1,25 vitamin D--pending -started IVF -[place]illon calcitonin and bisphosphonate not started as focus of patient's care transitioned to full comfort   COPD exacerbation -continue duonebs -continue pulmicort -continue IV solumedrol -focus of patient's care transitioned to full comfort   Newly diagnosed RUL malignancy  -was scheduled for outpatient follow up with PET scan and other tests.   -palliative medicine consultation for further goals of care discussions. -08/08/2020 CT chest with RUL mass with central hypoattenuation; additional airway thickening, interstitial opacity and basilar consolidation which may be edema, infection vs lymphangitic spread -focus of patient's care transitioned to full comfort   Anemia in  neoplastic disease -  -Hg down to 7.  Given 1 unit PRBC 8/9 -B12--286 -iron studies--iron sat 9 %; ferritin  493   Tobacco abuse -cessation discussed     SVT -intermittently up to 180-200 -continue IV lopressor>>d/c as focus of care transitioned to full comfort   Bipolar disorder -unable to take cymbalta, buspar, remeron while on bipap   Goals of Care -.palliative medicine following -Advance care planning, including the explanation and discussion of advance directives was carried out with the patient and family.  Code status including explanations of "Full Code" and "DNR" and alternatives were discussed in detail.  Discussion of end-of-life issues including but not limited palliative care, hospice care and the concept of hospice, other end-of-life care options, power of attorney for health care decisions, living wills, and physician orders for life-sustaining treatment were also discussed with the patient and family.  Total face to face time 16 minutes. -08/20/20 -08/20/20--family decided to transition to focus on full comfort-->will not restart BiPAP -morphine and ativan for anxiety and sob       Status is: Inpatient   Remains inpatient appropriate because:Inpatient level of care appropriate due to severity of illness   Dispo: The patient is from: Home              Anticipated d/c is to: Hospital death              Patient currently is not medically stable to d/c.              Difficult to place patient No            Total time spent 35 minutes.  Greater than 50% spent face to face counseling and coordinating care.        Family Communication:   daily [place] discussions-sons updated 8/13   Consultants:  palliative   Code Status: FULL COMFORT   DVT Prophylaxis:  FULL COMFORT   Procedures: As Listed in Progress Note Above   Antibiotics: Cefepime 8/7>>8/13    Subjective: Patient is awake and alert.  He complains of some sob.  He complains of pain all over.  Denies n/v/d.  Objective: Vitals:   08/20/20 0800 08/20/20 1100 08/20/20 1108 08/20/20 1327  BP: (!) 173/98      Pulse: 89 76 79   Resp: (!) 24  19   Temp:   97.9 F (36.6 C)   TempSrc:   Axillary   SpO2: (!) 84% 96% 96% 98%  Weight:      Height:        Intake/Output Summary (Last 24 hours) at 08/20/2020 1511 Last data filed at 08/20/2020 0900 Gross per 24 hour  Intake 1548.75 ml  Output 2700 ml  Net -1151.25 ml   Weight change:  Exam:  General:  Pt is alert, not in acute distress HEENT: No icterus, No thrush, No neck mass, [place]/AT Cardiovascular: RRR, S1/S2, no rubs, no gallops Respiratory: diminished BS.  Bilateral rales. Abdomen: Soft/+BS, non tender, non distended, no guarding Extremities: No edema, No lymphangitis, No petechiae, No rashes, no synovitis   Data Reviewed: I have personally reviewed following labs and imaging studies Basic Metabolic Panel: Recent Labs  Lab 08/15/20 0447 08/16/20 0314 08/18/20 0430 08/19/20 0356 08/20/20 0402  NA 136 139 135 138 138  K 4.5 4.2 4.2 4.1 3.4*  CL 98 99 87* 89* 94*  CO2 33* 35* 34* 37* 39*  GLUCOSE 124* 118* 127* 158* 133*  BUN 11 16 20  28* 29*  CREATININE 0.45* 0.56* 0.53* 0.56* 0.57*  CALCIUM 9.8 10.2 11.5* 12.5* 12.4*  MG 2.0 2.3  --   --   --    Liver Function Tests: Recent Labs  Lab 08/17/2020 0033 08/15/20 0447 08/18/20 0430 08/19/20 0356 08/20/20 0402  AST 14* 13* 11* 11* 14*  ALT 14 13 14 12 13   ALKPHOS 84 69 74 67 64  BILITOT 0.7 0.6 1.4* 0.7 0.7  PROT 7.0 5.7* 6.4* 6.2* 6.5  ALBUMIN 2.6* 2.1* 2.4* 2.4* 2.6*   No results for input(s): LIPASE, AMYLASE in the last 168 hours. No results for input(s): AMMONIA in the last 168 hours. Coagulation Profile: Recent Labs  Lab 08/11/2020 0033  INR 1.2   CBC: Recent Labs  Lab 08/22/2020 0033 08/15/20 0447 08/16/20 0314 08/18/20 0430 08/19/20 0356 08/20/20 0402  WBC 23.9* 15.1* 12.0* 17.1* 14.8* 12.5*  NEUTROABS 22.1*  --  10.7*  --   --   --   HGB 9.7* 7.7* 7.0* 9.5* 9.2* 9.1*  HCT 32.0* 26.0* 23.9* 31.9* 31.0* 31.1*  MCV 87.4 87.0 89.2 84.4 84.9 84.5  PLT  369 279 334 513* 564* 591*   Cardiac Enzymes: No results for input(s): CKTOTAL, CKMB, CKMBINDEX, TROPONINI in the last 168 hours. BNP: Invalid input(s): POCBNP CBG: No results for input(s): GLUCAP in the last 168 hours. HbA1C: No results for input(s): HGBA1C in the last 72 hours. Urine analysis:    Component Value Date/Time   COLORURINE YELLOW 08/29/2020 0908   APPEARANCEUR CLEAR 08/20/2020 0908   APPEARANCEUR Clear 11/14/2013 1034   LABSPEC 1.025 09/06/2020 0908   LABSPEC 1.003 11/14/2013 1034   PHURINE 6.0 08/09/2020 0908   GLUCOSEU NEGATIVE 08/13/2020 0908   GLUCOSEU Negative 11/14/2013 1034   HGBUR MODERATE (A) 08/18/2020 0908   BILIRUBINUR NEGATIVE 08/10/2020 0908   BILIRUBINUR Negative 11/14/2013 [place] 08/19/2020 0908   PROTEINUR NEGATIVE 08/19/2020 0908   NITRITE NEGATIVE 08/16/2020 0908   LEUKOCYTESUR NEGATIVE 08/12/2020 0908   LEUKOCYTESUR Negative 11/14/2013 1034   Sepsis Labs: @LABRCNTIP (procalcitonin:4,lacticidven:4) ) Recent Results (from the past 240 hour(s))  Blood Culture (routine x 2)     Status: None   Collection Time: 08/19/2020 12:28 AM   Specimen: Right Antecubital; Blood  Result Value Ref Range Status   Specimen Description RIGHT ANTECUBITAL  Final   Special Requests   Final    BOTTLES DRAWN AEROBIC AND ANAEROBIC Blood Culture adequate volume   Culture   Final    NO GROWTH 5 DAYS Performed at [facility], [address], [place], [place] [address]    Report Status 08/19/2020 FINAL  Final  Blood Culture (routine x 2)     Status: None   Collection Time: 08/10/2020 12:30 AM   Specimen: BLOOD RIGHT FOREARM  Result Value Ref Range Status   Specimen Description BLOOD RIGHT FOREARM  Final   Special Requests   Final    BOTTLES DRAWN AEROBIC AND ANAEROBIC Blood Culture adequate volume   Culture   Final    NO GROWTH 5 DAYS Performed at [facility], [address] [place], [place] [address]    Report Status 08/19/2020 FINAL  Final   Resp Panel by RT-PCR (Flu A&B, Covid) Nasopharyngeal Swab     Status: None   Collection Time: 08/26/2020 12:33 AM   Specimen: Nasopharyngeal Swab; Nasopharyngeal(NP) swabs in vial transport medium  Result Value Ref Range Status   SARS Coronavirus 2 by RT PCR NEGATIVE NEGATIVE Final    Comment: (NOTE) SARS-CoV-2 target  nucleic acids are NOT DETECTED.  The SARS-CoV-2 RNA is generally detectable in upper respiratory specimens during the acute phase of infection. The lowest concentration of SARS-CoV-2 viral copies this assay can detect is 138 copies/mL. A negative result does not preclude SARS-Cov-2 infection and should not be used as the sole basis for treatment or other patient management decisions. A negative result may occur with  improper specimen collection/handling, submission of specimen other than nasopharyngeal swab, presence of viral mutation(s) within the areas targeted by this assay, and inadequate number of viral copies(<138 copies/mL). A negative result must be combined with clinical observations, patient history, and epidemiological information. The expected result is Negative.  Fact Sheet for Patients:  EntrepreneurPulse.com.au  Fact Sheet for Healthcare Providers:  IncredibleEmployment.be  This test is no t yet approved or cleared by the [place] FDA and  has been authorized for detection and/or diagnosis of SARS-CoV-2 by FDA under an Emergency Use Authorization (EUA). This EUA will remain  in effect (meaning this test can be used) for the duration of the COVID-19 declaration under Section 564(b)(1) of the Act, 21 U.S.C.section 360bbb-3(b)(1), unless the authorization is terminated  or revoked sooner.       Influenza A by PCR NEGATIVE NEGATIVE Final   Influenza B by PCR NEGATIVE NEGATIVE Final    Comment: (NOTE) The Xpert Xpress SARS-CoV-2/FLU/RSV plus assay is intended as an aid in the diagnosis of influenza from  Nasopharyngeal swab specimens and should not be used as a sole basis for treatment. Nasal washings and aspirates are unacceptable for Xpert Xpress SARS-CoV-2/FLU/RSV testing.  Fact Sheet for Patients: EntrepreneurPulse.com.au  Fact Sheet for Healthcare Providers: IncredibleEmployment.be  This test is not yet approved or cleared by the [place] FDA and has been authorized for detection and/or diagnosis of SARS-CoV-2 by FDA under an Emergency Use Authorization (EUA). This EUA will remain in effect (meaning this test can be used) for the duration of the COVID-19 declaration under Section 564(b)(1) of the Act, 21 U.S.C. section 360bbb-3(b)(1), unless the authorization is terminated or revoked.  Performed at [facility], [address], [place], [place] [address]   MRSA Next Gen by PCR, Nasal     Status: None   Collection Time: 08/09/2020  9:07 AM   Specimen: Nasal Mucosa; Nasal Swab  Result Value Ref Range Status   MRSA by PCR Next Gen NOT DETECTED NOT DETECTED Final    Comment: (NOTE) The GeneXpert MRSA Assay (FDA approved for NASAL specimens only), is one component of a comprehensive MRSA colonization surveillance program. It is not intended to diagnose MRSA infection nor to guide or monitor treatment for MRSA infections. Test performance is not FDA approved in patients less than 7 years old. Performed at [facility], [address], [place], [place] [address]      Scheduled Meds:  Chlorhexidine Gluconate Cloth  6 each Topical Daily   ipratropium-albuterol  3 mL Nebulization Q6H   lidocaine  1 patch Transdermal Q24H   mouth rinse  15 mL Mouth Rinse BID   Continuous Infusions:  Procedures/Studies: CT CHEST W CONTRAST  Result Date: 08/30/2020 CLINICAL DATA:  Possible sepsis, recent diagnosis of lung cancer EXAM: CT CHEST WITH CONTRAST TECHNIQUE: Multidetector CT imaging of the chest was performed during intravenous contrast  administration. CONTRAST:  48mL OMNIPAQUE IOHEXOL 350 MG/ML SOLN COMPARISON:  Radiograph 08/16/2020, CT 02/22/2018 FINDINGS: Cardiovascular: Normal cardiac size. Three-vessel coronary artery atherosclerosis. Dense calcification in the aortic leaflets. No pericardial effusion. Atherosclerotic plaque within the normal caliber  aorta. No acute aortic abnormality is evident. Normal 3 vessel branching of the mildly calcified proximal great vessels without acute vascular abnormality. Central pulmonary arteries are normal caliber. Narrowing of the hilar airways and vessels entering the right upper lobe in the region of the large right upper lobe mass. Mediastinum/Nodes: The right hilar structures are closely abutted and partially obscured by large heterogeneous mass lesion which appears predominantly centered in the right upper lobe. Notable narrowing and occlusion of the right upper lobe airways and vessels. Mediastinal adenopathy is seen including a 14 mm precarinal node (2/61) and a 10 mm subcarinal node (2/74). No demonstrable left hilar adenopathy or concerning axillary adenopathy. No acute abnormality more central trachea or thoracic esophagus. Thyroid gland and thoracic inlet are unremarkable. Lungs/Pleura: Large heterogeneous enhancing lesion centered in the right upper lobe measuring 10.8 x 9.4 x 10.8 cm in size with more hypoattenuating central regions which could reflect some central necrosis. This distorts and occludes the closely approximated high right hilar vessels and airways with essentially complete occlusion of the right upper lobe bronchus and extensive surrounding airless lung. Additional areas of projection into both the posterior right middle lobe (5/38 and right lower lobe (6/34). Regions of airless lung abutting this mass lesion demonstrate heterogeneous parenchymal enhancement which could reflect postobstructive pneumonia or direct extension of the mass lesion, difficult to fully ascertain. Airways  thickening as well as basilar predominant interstitial opacities and basilar density in both lower lobes are more nonspecific. Background of centrilobular and paraseptal emphysematous changes. Upper Abdomen: Periportal edematous changes, nonspecific. No visible focal liver lesion within the limitations of this exam. No other acute or suspicious upper abdominal abnormalities. Musculoskeletal: No suspicious lytic or blastic lesions. Multilevel degenerative changes are present in the imaged portions of the spine. IMPRESSION: Heterogeneously enhancing mass lesion seen in the right upper lobe with some central hypoattenuation, possibly necrotic change. Closely approximating the right hilar structures with narrowing of the airways and vessels in this vicinity. Consistent with patient's reported primary lung malignancy. Heterogeneous enhancement of the surrounding airless lung parenchyma the left upper lobe, can reflect some direct extension and or postobstructive pneumonia particularly clinical features of sepsis. Additional airways thickening, interstitial opacity and basilar consolidation, more nonspecific and could reflect infection, edema versus lymphangitic spread. Mediastinal adenopathy, concerning for metastatic disease. Aortic Atherosclerosis (ICD10-I70.0). Emphysema (ICD10-J43.9). These results were called by telephone at the time of interpretation on 08/26/2020 at 4:02 am to provider Ethelda Deangelo ORTIZ , who verbally acknowledged these results. Electronically Signed   By: Lovena Le M.D.   On: 08/20/2020 04:02   DG CHEST PORT 1 VIEW  Result Date: 08/17/2020 CLINICAL DATA:  Acute respiratory failure with hypoxia. EXAM: PORTABLE CHEST 1 VIEW COMPARISON:  August 14, 2020. FINDINGS: The heart size and mediastinal contours are within normal limits. No pneumothorax or pleural effusion is noted. Stable large right upper lobe opacity is noted most consistent with postobstructive atelectasis or pneumonia secondary to central  malignancy or neoplasm. Mild left midlung opacity is noted concerning for possible infiltrate or atelectasis. The visualized skeletal structures are unremarkable. IMPRESSION: Stable large right upper lobe opacity is noted most consistent with postobstructive atelectasis or pneumonia secondary to central malignancy or neoplasm. Mild left midlung opacity is noted concerning for possible infiltrate or atelectasis. Electronically Signed   By: Marijo Conception M.D.   On: 08/17/2020 14:44   DG Chest Port 1 View  Result Date: 08/31/2020 CLINICAL DATA:  Questionable sepsis EXAM: PORTABLE CHEST 1 VIEW COMPARISON:  02/18/2018 FINDINGS: Complete opacification of the right upper lung/lobe. This could reflect infiltrate or mass. No confluent opacity on the left. No visible effusions. Mild hyperinflation/COPD. Heart is normal size. IMPRESSION: Complete opacification of the right upper lobe. This could reflect pneumonia or mass. Postobstructive process cannot be excluded. Consider further evaluation with chest CT with IV contrast. Electronically Signed   By: Rolm Baptise M.D.   On: 09/02/2020 01:19    Orson Eva, DO  [facility]  If 7PM-7AM, please contact night-coverage [email or web address] Password [email or web address] 08/20/2020, 3:11 PM   LOS: 6 days

## 2020-08-20 NOTE — Progress Notes (Signed)
Pharmacy Antibiotic Note  Robert [name] is a 65 y.o. male admitted on 09/03/2020 with pneumonia.  Pharmacy has been consulted for vancomycin and cefepime dosing. Acute on chronic respiratory failure with hypoxia and hypercapnia-secondary to COPD exacerbation and right upper lobe consolidation and tumor. AF, WBC improved; patient continues to decompensate   Plan: Continue cefepime 2gm IV q8 Also on azithromycin x 5 days F/u renal function, cultures and clinical course F/U LOT  Height: 5\' 8"  (172.7 cm) Weight: 60.1 kg (132 lb 7.9 oz) IBW/kg (Calculated) : 68.4  Temp (24hrs), Avg:97.7 F (36.5 C), Min:96.8 F (36 C), Max:98.2 F (36.8 C)  Recent Labs  Lab 08/11/2020 0033 08/13/2020 0258 08/15/20 0447 08/16/20 0314 08/18/20 0430 08/19/20 0356 08/20/20 0402  WBC 23.9*  --  15.1* 12.0* 17.1* 14.8* 12.5*  CREATININE 0.62  --  0.45* 0.56* 0.53* 0.56* 0.57*  LATICACIDVEN 1.8 1.9  --   --   --   --   --      Estimated Creatinine Clearance: 78.3 mL/min (A) (by C-G formula based on SCr of 0.57 mg/dL (L)).    No Known Allergies  Antibiotics: Cefepime 8/7 >> Azith 8/7 >>8/12 Vanco 8/7 >>8/8  Cultures: 8/7 Bcx: ngtd 8/7 Sputum Cx: pending 8/7 MRSA PCR: neg  Thank you for allowing pharmacy to be a part of this patient's care.  Hart Robinsons, PharmD Clinical Pharmacist  08/20/2020 8:53 AM

## 2020-08-21 DIAGNOSIS — J441 Chronic obstructive pulmonary disease with (acute) exacerbation: Secondary | ICD-10-CM | POA: Diagnosis not present

## 2020-08-21 DIAGNOSIS — J181 Lobar pneumonia, unspecified organism: Secondary | ICD-10-CM | POA: Diagnosis not present

## 2020-08-21 DIAGNOSIS — J9601 Acute respiratory failure with hypoxia: Secondary | ICD-10-CM | POA: Diagnosis not present

## 2020-08-21 MED ORDER — MORPHINE SULFATE (PF) 2 MG/ML IV SOLN
2.0000 mg | INTRAVENOUS | Status: DC | PRN
  Administered 2020-08-21 – 2020-08-22 (×4): 2 mg via INTRAVENOUS
  Filled 2020-08-21 (×4): qty 1

## 2020-08-21 MED ORDER — LORAZEPAM 2 MG/ML IJ SOLN: 1.0000 mg | INTRAMUSCULAR | Status: DC | PRN

## 2020-08-21 NOTE — Progress Notes (Signed)
PROGRESS NOTE  Robert [name]:[id number] DOB: [birth year] DOA: 08/30/2020 PCP: [facility]  Brief History:  65 y/o male with a history of COPD, chronic respiratory failure on 3 L, anxiety, tobacco abuse, alcohol abuse, bipolar disorder, PTSD, HLD, recently diagnosed Lung Cancer brought to the emergency department via EMS due to dyspnea.  He was recently discharged from [facility], [place] due to sepsis secondary to pneumonia in the setting of newly diagnosed lung cancer.  He received 125 mg of Solu-Medrol IV PE, 2 duo nebs and an albuterol neb in route.  He is currently on BiPAP and unable to provide much information.  He denied having any headache, chest, back or abdominal pain at this time.   ED Course: Initial vital signs were temperature 103.6 F, pulse 01/09/1932, respirations 37, BP 116/50 mmHg and O2 sat 98% on NRB oxygen.  The patient was going to be intubated by Dr. Wyvonnia Dusky but he declined and wants to be DNR.  This was confirmed to me while I was examining him.  He did receive acetaminophen 650 mg rectally, 3000 mL of IV fluid bolus, magnesium sulfate 2 g IVPB, no other DuoNeb, cefepime and vancomycin. The patient was ultimately placed on bipap.  The patient had episodes of agitation and confusion.  Palliative medicine was consulted to help discuss [place].  The pt was slow to improve although sepsis physiology did improve.  He continued to need intermittent bipap.  Given his overall deconditioned and debilitated state, he certainly would not be a candidate for chemotherapy.  His prognosis for recovery to his pre-morbid state was poor in the setting of his multiple medical problems, deconditioning, and lung cancer with likelihood of lymphangitic spread.  This was discussed with his family through many [place] discussions.  Ultimately, the patient's sons and daughter decided to transition his focus of care to focus soley on his comfort.  Medications with curative  intent were discontinued.     Assessment/Plan: Sepsis -present on admission -presented with fever, tachycardia, tachypnea, leukocytosis -continue cefepime -MRSA neg -due to pneumonia -PCT 0.50 -lactic peaked 1.9 -sepsis physiology resolved -8/13--no longer active issues as focus of care transitioned to full comfort   Acute on chronic respiratory failure with hypoxia and hypercapnea -due to pneumonia and COPD exacerbation -remains on BiPAP -does not want to escalate care at this point and has requested DNR DNI status -GOC discussion continues daily with family -repeat ABG 8/11--7.43/58/79/36 on 0.6 -tolerated HFNC about 10 hours 8/11-->back on BiPAP overnight 8/11-8/12 -08/20/20--family decided to transition to focus on full comfort-->will not restart BiPAP -morphine and ativan for anxiety and sob -8/13--no longer active issues as focus of care transitioned to full comfort   Lobar Pneumonia -continue cefepime -MRSA neg -personally reviewed CXR--RUL opacity, increased interstitial markings -08/20/20--family decided to transition to focus on full comfort-->will not restart BiPAP -8/13--no longer active issues as focus of care transitioned to full comfort   Hypercalcemia -8/11 corrected calcium 12.8 -likely due to immobility and malignancy -intact PTH--9 -PTHrp--pending -25 vitamin D--9.38 -1,25 vitamin D--pending -started IVF -[place] calcitonin and bisphosphonate not started as focus of patient's care transitioned to full comfort   COPD exacerbation -continue duonebs -continue pulmicort -continue IV solumedrol -focus of patient's care transitioned to full comfort   Newly diagnosed RUL malignancy  -was scheduled for outpatient follow up with PET scan and other tests.   -palliative medicine consultation for further goals of care discussions. -09/05/2020  CT chest with RUL mass with central hypoattenuation; additional airway thickening, interstitial opacity and basilar  consolidation which may be edema, infection vs lymphangitic spread -focus of patient's care transitioned to full comfort   Anemia in neoplastic disease -  -Hg down to 7.  Given 1 unit PRBC 8/9 -B12--286 -iron studies--iron sat 9 %; ferritin 493 -8/13--no longer active issues as focus of care transitioned to full comfort   Tobacco abuse -cessation discussed     SVT -intermittently up to 180-200 -continue IV lopressor>>d/c as focus of care transitioned to full comfort -8/13--no longer active issues as focus of care transitioned to full comfort   Bipolar disorder -unable to take cymbalta, buspar, remeron while on bipap   Goals of Care -.palliative medicine following -Advance care planning, including the explanation and discussion of advance directives was carried out with the patient and family.  Code status including explanations of "Full Code" and "DNR" and alternatives were discussed in detail.  Discussion of end-of-life issues including but not limited palliative care, hospice care and the concept of hospice, other end-of-life care options, power of attorney for health care decisions, living wills, and physician orders for life-sustaining treatment were also discussed with the patient and family.  Total face to face time 16 minutes. -08/20/20 -08/20/20--family decided to transition to focus on full comfort-->will not restart BiPAP -morphine and ativan for anxiety and sob       Status is: Inpatient   Remains inpatient appropriate because:Inpatient level of care appropriate due to severity of illness   Dispo: The patient is from: Home              Anticipated d/c is to: Hospital death vs residential hospice              Patient currently is not medically stable to d/c.              Difficult to place patient No            Total time spent 35 minutes.  Greater than 50% spent face to face counseling and coordinating care.         Family Communication:   daily [place]  discussions-sons updated 8/14   Consultants:  palliative   Code Status: FULL COMFORT   DVT Prophylaxis:  FULL COMFORT   Procedures: As Listed in Progress Note Above   Antibiotics: Cefepime 8/7>>8/13     Subjective: Patient is awake.  Complains of pain.  Unable to elaborate.  Complains of sob.  Remainder ROS no possible due to altered mentation.  No reports of vomiting, diarrhea.  Objective: Vitals:   08/21/20 0807 08/21/20 1000 08/21/20 1015 08/21/20 1356  BP:  (!) 180/88    Pulse:  (!) 103    Resp:  (!) 32    Temp:  97.9 F (36.6 C)    TempSrc:  Axillary    SpO2: 94% (!) 86% 92% 95%  Weight:      Height:        Intake/Output Summary (Last 24 hours) at 08/21/2020 1429 Last data filed at 08/21/2020 1300 Gross per 24 hour  Intake 1360.47 ml  Output 4300 ml  Net -2939.53 ml   Weight change:  Exam:  General:  Pt is alert, does not follow commands appropriately, not in acute distress HEENT: No icterus,  [place]/AT Cardiovascular: RRR, S1/S2, no rubs, no gallops Respiratory: bilateral rales. No wheeze.  Diminished BS bilateral Abdomen: Soft/+BS, non tender, non distended, no guarding Extremities: No edema, No lymphangitis,  No petechiae, No rashes, no synovitis   Data Reviewed: I have personally reviewed following labs and imaging studies Basic Metabolic Panel: Recent Labs  Lab 08/15/20 0447 08/16/20 0314 08/18/20 0430 08/19/20 0356 08/20/20 0402  NA 136 139 135 138 138  K 4.5 4.2 4.2 4.1 3.4*  CL 98 99 87* 89* 94*  CO2 33* 35* 34* 37* 39*  GLUCOSE 124* 118* 127* 158* 133*  BUN 11 16 20  28* 29*  CREATININE 0.45* 0.56* 0.53* 0.56* 0.57*  CALCIUM 9.8 10.2 11.5* 12.5* 12.4*  MG 2.0 2.3  --   --   --    Liver Function Tests: Recent Labs  Lab 08/15/20 0447 08/18/20 0430 08/19/20 0356 08/20/20 0402  AST 13* 11* 11* 14*  ALT 13 14 12 13   ALKPHOS 69 74 67 64  BILITOT 0.6 1.4* 0.7 0.7  PROT 5.7* 6.4* 6.2* 6.5  ALBUMIN 2.1* 2.4* 2.4* 2.6*   No results for  input(s): LIPASE, AMYLASE in the last 168 hours. No results for input(s): AMMONIA in the last 168 hours. Coagulation Profile: No results for input(s): INR, PROTIME in the last 168 hours. CBC: Recent Labs  Lab 08/15/20 0447 08/16/20 0314 08/18/20 0430 08/19/20 0356 08/20/20 0402  WBC 15.1* 12.0* 17.1* 14.8* 12.5*  NEUTROABS  --  10.7*  --   --   --   HGB 7.7* 7.0* 9.5* 9.2* 9.1*  HCT 26.0* 23.9* 31.9* 31.0* 31.1*  MCV 87.0 89.2 84.4 84.9 84.5  PLT 279 334 513* 564* 591*   Cardiac Enzymes: No results for input(s): CKTOTAL, CKMB, CKMBINDEX, TROPONINI in the last 168 hours. BNP: Invalid input(s): POCBNP CBG: No results for input(s): GLUCAP in the last 168 hours. HbA1C: No results for input(s): HGBA1C in the last 72 hours. Urine analysis:    Component Value Date/Time   COLORURINE YELLOW 08/21/2020 0908   APPEARANCEUR CLEAR 09/01/2020 0908   APPEARANCEUR Clear 11/14/2013 1034   LABSPEC 1.025 08/18/2020 0908   LABSPEC 1.003 11/14/2013 1034   PHURINE 6.0 08/12/2020 0908   GLUCOSEU NEGATIVE 08/18/2020 0908   GLUCOSEU Negative 11/14/2013 1034   HGBUR MODERATE (A) 08/29/2020 0908   BILIRUBINUR NEGATIVE 09/07/2020 0908   BILIRUBINUR Negative 11/14/2013 [place] 08/25/2020 0908   PROTEINUR NEGATIVE 09/06/2020 0908   NITRITE NEGATIVE 08/11/2020 0908   LEUKOCYTESUR NEGATIVE 09/07/2020 0908   LEUKOCYTESUR Negative 11/14/2013 1034   Sepsis Labs: @LABRCNTIP (procalcitonin:4,lacticidven:4) ) Recent Results (from the past 240 hour(s))  Blood Culture (routine x 2)     Status: None   Collection Time: 08/26/2020 12:28 AM   Specimen: Right Antecubital; Blood  Result Value Ref Range Status   Specimen Description RIGHT ANTECUBITAL  Final   Special Requests   Final    BOTTLES DRAWN AEROBIC AND ANAEROBIC Blood Culture adequate volume   Culture   Final    NO GROWTH 5 DAYS Performed at [facility], [address] [place], [place] [address]    Report Status 08/19/2020  FINAL  Final  Blood Culture (routine x 2)     Status: None   Collection Time: 08/30/2020 12:30 AM   Specimen: BLOOD RIGHT FOREARM  Result Value Ref Range Status   Specimen Description BLOOD RIGHT FOREARM  Final   Special Requests   Final    BOTTLES DRAWN AEROBIC AND ANAEROBIC Blood Culture adequate volume   Culture   Final    NO GROWTH 5 DAYS Performed at [facility], [address], [place],  [address]    Report  Status 08/19/2020 FINAL  Final  Resp Panel by RT-PCR (Flu A&B, Covid) Nasopharyngeal Swab     Status: None   Collection Time: 08/13/2020 12:33 AM   Specimen: Nasopharyngeal Swab; Nasopharyngeal(NP) swabs in vial transport medium  Result Value Ref Range Status   SARS Coronavirus 2 by RT PCR NEGATIVE NEGATIVE Final    Comment: (NOTE) SARS-CoV-2 target nucleic acids are NOT DETECTED.  The SARS-CoV-2 RNA is generally detectable in upper respiratory specimens during the acute phase of infection. The lowest concentration of SARS-CoV-2 viral copies this assay can detect is 138 copies/mL. A negative result does not preclude SARS-Cov-2 infection and should not be used as the sole basis for treatment or other patient management decisions. A negative result may occur with  improper specimen collection/handling, submission of specimen other than nasopharyngeal swab, presence of viral mutation(s) within the areas targeted by this assay, and inadequate number of viral copies(<138 copies/mL). A negative result must be combined with clinical observations, patient history, and epidemiological information. The expected result is Negative.  Fact Sheet for Patients:  EntrepreneurPulse.com.au  Fact Sheet for Healthcare Providers:  IncredibleEmployment.be  This test is no t yet approved or cleared by the [place] FDA and  has been authorized for detection and/or diagnosis of SARS-CoV-2 by FDA under an Emergency Use Authorization (EUA). This EUA  will remain  in effect (meaning this test can be used) for the duration of the COVID-19 declaration under Section 564(b)(1) of the Act, 21 U.S.C.section 360bbb-3(b)(1), unless the authorization is terminated  or revoked sooner.       Influenza A by PCR NEGATIVE NEGATIVE Final   Influenza B by PCR NEGATIVE NEGATIVE Final    Comment: (NOTE) The Xpert Xpress SARS-CoV-2/FLU/RSV plus assay is intended as an aid in the diagnosis of influenza from Nasopharyngeal swab specimens and should not be used as a sole basis for treatment. Nasal washings and aspirates are unacceptable for Xpert Xpress SARS-CoV-2/FLU/RSV testing.  Fact Sheet for Patients: EntrepreneurPulse.com.au  Fact Sheet for Healthcare Providers: IncredibleEmployment.be  This test is not yet approved or cleared by the [place] FDA and has been authorized for detection and/or diagnosis of SARS-CoV-2 by FDA under an Emergency Use Authorization (EUA). This EUA will remain in effect (meaning this test can be used) for the duration of the COVID-19 declaration under Section 564(b)(1) of the Act, 21 U.S.C. section 360bbb-3(b)(1), unless the authorization is terminated or revoked.  Performed at [facility], [address], [place], [place] [address]   MRSA Next Gen by PCR, Nasal     Status: None   Collection Time: 09/07/2020  9:07 AM   Specimen: Nasal Mucosa; Nasal Swab  Result Value Ref Range Status   MRSA by PCR Next Gen NOT DETECTED NOT DETECTED Final    Comment: (NOTE) The GeneXpert MRSA Assay (FDA approved for NASAL specimens only), is one component of a comprehensive MRSA colonization surveillance program. It is not intended to diagnose MRSA infection nor to guide or monitor treatment for MRSA infections. Test performance is not FDA approved in patients less than 105 years old. Performed at [facility], [address] [place], [place] [address]      Scheduled Meds:  Chlorhexidine  Gluconate Cloth  6 each Topical Daily   mouth rinse  15 mL Mouth Rinse BID   Continuous Infusions:  Procedures/Studies: CT CHEST W CONTRAST  Result Date: 09/04/2020 CLINICAL DATA:  Possible sepsis, recent diagnosis of lung cancer EXAM: CT CHEST WITH CONTRAST TECHNIQUE: Multidetector CT imaging  of the chest was performed during intravenous contrast administration. CONTRAST:  86mL OMNIPAQUE IOHEXOL 350 MG/ML SOLN COMPARISON:  Radiograph 08/22/2020, CT 02/22/2018 FINDINGS: Cardiovascular: Normal cardiac size. Three-vessel coronary artery atherosclerosis. Dense calcification in the aortic leaflets. No pericardial effusion. Atherosclerotic plaque within the normal caliber aorta. No acute aortic abnormality is evident. Normal 3 vessel branching of the mildly calcified proximal great vessels without acute vascular abnormality. Central pulmonary arteries are normal caliber. Narrowing of the hilar airways and vessels entering the right upper lobe in the region of the large right upper lobe mass. Mediastinum/Nodes: The right hilar structures are closely abutted and partially obscured by large heterogeneous mass lesion which appears predominantly centered in the right upper lobe. Notable narrowing and occlusion of the right upper lobe airways and vessels. Mediastinal adenopathy is seen including a 14 mm precarinal node (2/61) and a 10 mm subcarinal node (2/74). No demonstrable left hilar adenopathy or concerning axillary adenopathy. No acute abnormality more central trachea or thoracic esophagus. Thyroid gland and thoracic inlet are unremarkable. Lungs/Pleura: Large heterogeneous enhancing lesion centered in the right upper lobe measuring 10.8 x 9.4 x 10.8 cm in size with more hypoattenuating central regions which could reflect some central necrosis. This distorts and occludes the closely approximated high right hilar vessels and airways with essentially complete occlusion of the right upper lobe bronchus and extensive  surrounding airless lung. Additional areas of projection into both the posterior right middle lobe (5/38 and right lower lobe (6/34). Regions of airless lung abutting this mass lesion demonstrate heterogeneous parenchymal enhancement which could reflect postobstructive pneumonia or direct extension of the mass lesion, difficult to fully ascertain. Airways thickening as well as basilar predominant interstitial opacities and basilar density in both lower lobes are more nonspecific. Background of centrilobular and paraseptal emphysematous changes. Upper Abdomen: Periportal edematous changes, nonspecific. No visible focal liver lesion within the limitations of this exam. No other acute or suspicious upper abdominal abnormalities. Musculoskeletal: No suspicious lytic or blastic lesions. Multilevel degenerative changes are present in the imaged portions of the spine. IMPRESSION: Heterogeneously enhancing mass lesion seen in the right upper lobe with some central hypoattenuation, possibly necrotic change. Closely approximating the right hilar structures with narrowing of the airways and vessels in this vicinity. Consistent with patient's reported primary lung malignancy. Heterogeneous enhancement of the surrounding airless lung parenchyma the left upper lobe, can reflect some direct extension and or postobstructive pneumonia particularly clinical features of sepsis. Additional airways thickening, interstitial opacity and basilar consolidation, more nonspecific and could reflect infection, edema versus lymphangitic spread. Mediastinal adenopathy, concerning for metastatic disease. Aortic Atherosclerosis (ICD10-I70.0). Emphysema (ICD10-J43.9). These results were called by telephone at the time of interpretation on 09/03/2020 at 4:02 am to provider Azaria Bartell ORTIZ , who verbally acknowledged these results. Electronically Signed   By: Lovena Le M.D.   On: 09/06/2020 04:02   DG CHEST PORT 1 VIEW  Result Date:  08/17/2020 CLINICAL DATA:  Acute respiratory failure with hypoxia. EXAM: PORTABLE CHEST 1 VIEW COMPARISON:  August 14, 2020. FINDINGS: The heart size and mediastinal contours are within normal limits. No pneumothorax or pleural effusion is noted. Stable large right upper lobe opacity is noted most consistent with postobstructive atelectasis or pneumonia secondary to central malignancy or neoplasm. Mild left midlung opacity is noted concerning for possible infiltrate or atelectasis. The visualized skeletal structures are unremarkable. IMPRESSION: Stable large right upper lobe opacity is noted most consistent with postobstructive atelectasis or pneumonia secondary to central malignancy or neoplasm. Mild left midlung  opacity is noted concerning for possible infiltrate or atelectasis. Electronically Signed   By: Marijo Conception M.D.   On: 08/17/2020 14:44   DG Chest Port 1 View  Result Date: 09/04/2020 CLINICAL DATA:  Questionable sepsis EXAM: PORTABLE CHEST 1 VIEW COMPARISON:  02/18/2018 FINDINGS: Complete opacification of the right upper lung/lobe. This could reflect infiltrate or mass. No confluent opacity on the left. No visible effusions. Mild hyperinflation/COPD. Heart is normal size. IMPRESSION: Complete opacification of the right upper lobe. This could reflect pneumonia or mass. Postobstructive process cannot be excluded. Consider further evaluation with chest CT with IV contrast. Electronically Signed   By: Rolm Baptise M.D.   On: 08/13/2020 01:19    Orson Eva, DO  [facility]  If 7PM-7AM, please contact night-coverage [email or web address] Password [email or web address] 08/21/2020, 2:29 PM   LOS: 7 days

## 2020-08-22 DIAGNOSIS — J9621 Acute and chronic respiratory failure with hypoxia: Secondary | ICD-10-CM | POA: Diagnosis not present

## 2020-08-22 DIAGNOSIS — J181 Lobar pneumonia, unspecified organism: Secondary | ICD-10-CM | POA: Diagnosis not present

## 2020-08-22 DIAGNOSIS — J9622 Acute and chronic respiratory failure with hypercapnia: Secondary | ICD-10-CM | POA: Diagnosis not present

## 2020-08-22 MED ORDER — MORPHINE 100MG IN NS 100ML (1MG/ML) PREMIX INFUSION
1.5000 mg/h | INTRAVENOUS | Status: DC
  Administered 2020-08-22: 1.5 mg/h via INTRAVENOUS
  Filled 2020-08-22: qty 100

## 2020-08-22 NOTE — TOC Initial Note (Signed)
Transition of Care [facility]) - Initial/Assessment Note    Patient Details  Name: Robert [name] MRN: [id number] Date of Birth: 1955/06/24  Transition of Care [facility] CM/SW Contact:    Boneta Lucks, RN Phone Number: 08/22/2020, 1:19 PM  Clinical Narrative:      TOC consulted to refer patient to residential hospice.  TOC spoke with Daughter Dartha Lodge, she is agreeable. TOC sent the referral to [facility] and updated Aldona Bar to call Ria Comment his daughter. TOC waiting on bed status.   Expected Discharge Plan: [place] Barriers to Discharge: Continued Medical Work up  Patient Goals and CMS Choice Patient states their goals for this hospitalization and ongoing recovery are:: family is agreeable to residential hospice. CMS Medicare.gov Compare Post Acute Care list provided to:: Patient Represenative (must comment) Choice offered to / list presented to : Adult Children  Expected Discharge Plan and Services Expected Discharge Plan: [place] Agency: [facility] Date [facility] Agency Contacted: 08/22/20 Time Dresser: 36 Representative spoke with at [place]: [place] Arrangements/Services     Patient language and need for interpreter reviewed:: Yes Do you feel safe going back to the place where you live?: No (Need Residential Hospice)      Need for Family Participation in Patient Care: Yes (Comment) Care giver support system in place?: Yes (comment)   Criminal Activity/Legal Involvement Pertinent to Current Situation/Hospitalization: No - Comment as needed  Activities of Daily Living Home Assistive Devices/Equipment: Oxygen, Walker (specify type), Cane (specify quad or straight), Bedside commode/3-in-1 ADL Screening (condition at time of admission) Patient's cognitive ability adequate to safely complete daily activities?: Yes Is the patient deaf or have difficulty hearing?: No Does the patient  have difficulty seeing, even when wearing glasses/contacts?: No Does the patient have difficulty concentrating, remembering, or making decisions?: Yes Patient able to express need for assistance with ADLs?: Yes Does the patient have difficulty dressing or bathing?: Yes Independently performs ADLs?: No Communication: Independent Dressing (OT): Needs assistance Is this a change from baseline?: Pre-admission baseline Grooming: Needs assistance Is this a change from baseline?: Pre-admission baseline Feeding: Needs assistance Is this a change from baseline?: Pre-admission baseline Bathing: Needs assistance Is this a change from baseline?: Pre-admission baseline Toileting: Needs assistance Is this a change from baseline?: Pre-admission baseline In/Out Bed: Needs assistance Is this a change from baseline?: Pre-admission baseline Walks in Home: Needs assistance Is this a change from baseline?: Pre-admission baseline Does the patient have difficulty walking or climbing stairs?: Yes Weakness of Legs: Both Weakness of Arms/Hands: Both  Permission Sought/Granted       Emotional Assessment      Alcohol / Substance Use: Not Applicable Psych Involvement: No (comment)  Admission diagnosis:  Lung mass [R91.8] Acute respiratory failure with hypoxia (HCC) [J96.01] Right upper lobe consolidation ([place]) [J18.1] Sepsis with acute hypoxic respiratory failure without septic shock, due to unspecified organism ([place]) [A41.9, R65.20, J96.01] Acute and chronic respiratory failure with hypoxia ([place]) [J96.21] Patient Active Problem List   Diagnosis Date Noted   Lobar pneumonia ([place]) 08/18/2020   Hypercalcemia 08/18/2020   Pressure injury of skin 08/18/2020   Lung mass    Sepsis with acute hypoxic respiratory failure without septic shock ([place])    Malnutrition of moderate degree 08/16/2020   Right upper lobe consolidation ([place]) 08/13/2020   Normocytic anemia 08/20/2020   History of lung cancer  09/04/2020   Aortic atherosclerosis ([place]) 09/03/2020  Acute respiratory failure with hypoxia ([place]) 08/25/2020   Goals of care, counseling/discussion 02/22/2018   Chronic respiratory failure with hypoxia ([place]) 02/19/2018   Acute on chronic respiratory failure with hypoxia and hypercapnia (HCC)    Delirium tremens ([place]) 04/18/2017   Acute on chronic respiratory failure with hypoxia (HCC) 04/17/2017   Tobacco abuse 04/17/2017   TIA (transient ischemic attack) 02/16/2016   COPD exacerbation ([place]) 10/31/2015   COPD (chronic obstructive pulmonary disease) (HCC)    High cholesterol    PTSD (post-traumatic stress disorder)    Alcohol addiction ([place])    Bipolar 1 disorder ([place])    PCP:  [facility] [place]:   [place] [address] [place] [address] Phone: [phone] Fax: [phone]  [facility] [address], [place] - [place] [address] [place] [address] Phone: [phone] Fax: [phone]   Readmission Risk Interventions Readmission Risk Prevention Plan 08/22/2020  Transportation Screening Complete  Home Care Screening Complete  Medication Review (RN CM) Complete  Some recent data might be hidden

## 2020-08-22 NOTE — Progress Notes (Signed)
PROGRESS NOTE  Robert [name] RCV:[id number] DOB: 10/04/1955 DOA: 08/24/2020 PCP: [facility]  Brief History:  65 y/o male with a history of COPD, chronic respiratory failure on 3 L, anxiety, tobacco abuse, alcohol abuse, bipolar disorder, PTSD, HLD, recently diagnosed Lung Cancer brought to the emergency department via EMS due to dyspnea.  He was recently discharged from [facility], [place] due to sepsis secondary to pneumonia in the setting of newly diagnosed lung cancer.  He received 125 mg of Solu-Medrol IV PE, 2 duo nebs and an albuterol neb in route.  He is currently on BiPAP and unable to provide much information.  He denied having any headache, chest, back or abdominal pain at this time.   ED Course: Initial vital signs were temperature 103.6 F, pulse 01/09/1932, respirations 37, BP 116/50 mmHg and O2 sat 98% on NRB oxygen.  The patient was going to be intubated by Dr. Wyvonnia Dusky but he declined and wants to be DNR.  This was confirmed to me while I was examining him.  He did receive acetaminophen 650 mg rectally, 3000 mL of IV fluid bolus, magnesium sulfate 2 g IVPB, no other DuoNeb, cefepime and vancomycin. The patient was ultimately placed on bipap.  The patient had episodes of agitation and confusion.  Palliative medicine was consulted to help discuss [place].  The pt was slow to improve although sepsis physiology did improve.  He continued to need intermittent bipap.  Given his overall deconditioned and debilitated state, he certainly would not be a candidate for chemotherapy.  His prognosis for recovery to his pre-morbid state was poor in the setting of his multiple medical problems, deconditioning, and lung cancer with likelihood of lymphangitic spread.  This was discussed with his family through many [place] discussions.  Ultimately, the patient's sons and daughter decided to transition his focus of care to focus soley on his comfort.  Medications with curative  intent were discontinued.  The patient was started on intermittent dosing morphine, but he continued to have sob and some agitation.  He was transitioned to morphine continuous infusion.    Assessment/Plan: Sepsis -present on admission -presented with fever, tachycardia, tachypnea, leukocytosis -continue cefepime -MRSA neg -due to pneumonia -PCT 0.50 -lactic peaked 1.9 -sepsis physiology resolved -8/13--no longer active issues as focus of care transitioned to full comfort   Acute on chronic respiratory failure with hypoxia and hypercapnea -due to pneumonia and COPD exacerbation -remains on BiPAP -does not want to escalate care at this point and has requested DNR DNI status -GOC discussion continues daily with family -repeat ABG 8/11--7.43/58/79/36 on 0.6 -tolerated HFNC about 10 hours 8/11-->back on BiPAP overnight 8/11-8/12 -08/20/20--family decided to transition to focus on full comfort-->will not restart BiPAP -morphine and ativan for anxiety and sob -8/13--no longer active issues as focus of care transitioned to full comfort   Lobar Pneumonia -continue cefepime -MRSA neg -personally reviewed CXR--RUL opacity, increased interstitial markings -08/20/20--family decided to transition to focus on full comfort-->will not restart BiPAP -8/13--no longer active issues as focus of care transitioned to full comfort   Hypercalcemia -8/11 corrected calcium 12.8 -likely due to immobility and malignancy -intact PTH--9 -PTHrp--pending -25 vitamin D--9.38 -1,25 vitamin D--pending -started IVF -[place] calcitonin and bisphosphonate not started as focus of patient's care transitioned to full comfort   COPD exacerbation -continue duonebs -continue pulmicort -continue IV solumedrol -focus of patient's care transitioned to full comfort   Newly diagnosed RUL malignancy  -  was scheduled for outpatient follow up with PET scan and other tests.   -palliative medicine consultation for further  goals of care discussions. -09/02/2020 CT chest with RUL mass with central hypoattenuation; additional airway thickening, interstitial opacity and basilar consolidation which may be edema, infection vs lymphangitic spread -focus of patient's care transitioned to full comfort   Anemia in neoplastic disease -  -Hg down to 7.  Given 1 unit PRBC 8/9 -B12--286 -iron studies--iron sat 9 %; ferritin 493 -8/13--no longer active issues as focus of care transitioned to full comfort   Tobacco abuse -cessation discussed     SVT -intermittently up to 180-200 -continue IV lopressor>>d/c as focus of care transitioned to full comfort -8/13--no longer active issues as focus of care transitioned to full comfort   Bipolar disorder -unable to take cymbalta, buspar, remeron while on bipap   Goals of Care -.palliative medicine following -Advance care planning, including the explanation and discussion of advance directives was carried out with the patient and family.  Code status including explanations of "Full Code" and "DNR" and alternatives were discussed in detail.  Discussion of end-of-life issues including but not limited palliative care, hospice care and the concept of hospice, other end-of-life care options, power of attorney for health care decisions, living wills, and physician orders for life-sustaining treatment were also discussed with the patient and family.  Total face to face time 16 minutes. -08/20/20 -08/20/20--family decided to transition to focus on full comfort-->will not restart BiPAP -morphine and ativan for anxiety and sob -transition to morphine continuous infusion as pt remains sob       Status is: Inpatient   Remains inpatient appropriate because:Inpatient level of care appropriate due to severity of illness   Dispo: The patient is from: Home              Anticipated d/c is to: Hospital death vs residential hospice              Patient currently is not medically stable to d/c.               Difficult to place patient No            Total time spent 35 minutes.  Greater than 50% spent face to face counseling and coordinating care.         Family Communication:   daily [place] discussions-sons updated 8/14   Consultants:  palliative   Code Status: FULL COMFORT   DVT Prophylaxis:  FULL COMFORT   Procedures: As Listed in Progress Note Above   Antibiotics: Cefepime 8/7>>8/13        Status is: Inpatient  Remains inpatient appropriate because:Inpatient level of care appropriate due to severity of illness  Dispo: The patient is from: Home              Anticipated d/c is to: in-hospital death              Patient currently is not medically stable to d/c.   Difficult to place patient No        Family Communication:   son updated 8/14  Consultants:  palliative medicine  Code Status:  FULL COMFORT  DVT Prophylaxis:  FULL COMFORT   Procedures: As Listed in Progress Note Above  Antibiotics: None      Subjective: Patient is awake.  Appears sob. ROS not possible as patient is not answering questions.  Objective: Vitals:   08/21/20 1015 08/21/20 1356 08/21/20 2016 08/22/20 0858  BP:   Marland Kitchen)  156/81   Pulse:   (!) 103   Resp:   20 (!) 28  Temp:   97.7 F (36.5 C)   TempSrc:   Axillary   SpO2: 92% 95% 94%   Weight:      Height:        Intake/Output Summary (Last 24 hours) at 08/22/2020 1655 Last data filed at 08/22/2020 0805 Gross per 24 hour  Intake 0 ml  Output 850 ml  Net -850 ml   Weight change:  Exam:  General:  Pt is alert, does not follow commands appropriately, not in acute distress HEENT: No icterus, No thrush, No neck mass, [place]/AT Cardiovascular: RRR, S1/S2, no rubs, no gallops Respiratory: bilateral rales. No wheeze Abdomen: Soft/+BS, non tender, non distended, no guarding Extremities: No edema, No lymphangitis, No petechiae, No rashes, no synovitis   Data Reviewed: I have personally reviewed following labs and  imaging studies Basic Metabolic Panel: Recent Labs  Lab 08/16/20 0314 08/18/20 0430 08/19/20 0356 08/20/20 0402  NA 139 135 138 138  K 4.2 4.2 4.1 3.4*  CL 99 87* 89* 94*  CO2 35* 34* 37* 39*  GLUCOSE 118* 127* 158* 133*  BUN 16 20 28* 29*  CREATININE 0.56* 0.53* 0.56* 0.57*  CALCIUM 10.2 11.5* 12.5* 12.4*  MG 2.3  --   --   --    Liver Function Tests: Recent Labs  Lab 08/18/20 0430 08/19/20 0356 08/20/20 0402  AST 11* 11* 14*  ALT 14 12 13   ALKPHOS 74 67 64  BILITOT 1.4* 0.7 0.7  PROT 6.4* 6.2* 6.5  ALBUMIN 2.4* 2.4* 2.6*   No results for input(s): LIPASE, AMYLASE in the last 168 hours. No results for input(s): AMMONIA in the last 168 hours. Coagulation Profile: No results for input(s): INR, PROTIME in the last 168 hours. CBC: Recent Labs  Lab 08/16/20 0314 08/18/20 0430 08/19/20 0356 08/20/20 0402  WBC 12.0* 17.1* 14.8* 12.5*  NEUTROABS 10.7*  --   --   --   HGB 7.0* 9.5* 9.2* 9.1*  HCT 23.9* 31.9* 31.0* 31.1*  MCV 89.2 84.4 84.9 84.5  PLT 334 513* 564* 591*   Cardiac Enzymes: No results for input(s): CKTOTAL, CKMB, CKMBINDEX, TROPONINI in the last 168 hours. BNP: Invalid input(s): POCBNP CBG: No results for input(s): GLUCAP in the last 168 hours. HbA1C: No results for input(s): HGBA1C in the last 72 hours. Urine analysis:    Component Value Date/Time   COLORURINE YELLOW 08/29/2020 0908   APPEARANCEUR CLEAR 09/02/2020 0908   APPEARANCEUR Clear 11/14/2013 1034   LABSPEC 1.025 08/25/2020 0908   LABSPEC 1.003 11/14/2013 1034   PHURINE 6.0 08/30/2020 0908   GLUCOSEU NEGATIVE 08/21/2020 0908   GLUCOSEU Negative 11/14/2013 1034   HGBUR MODERATE (A) 08/31/2020 0908   BILIRUBINUR NEGATIVE 09/01/2020 0908   BILIRUBINUR Negative 11/14/2013 1034   KETONESUR NEGATIVE 08/26/2020 0908   PROTEINUR NEGATIVE 08/22/2020 0908   NITRITE NEGATIVE 08/26/2020 0908   LEUKOCYTESUR NEGATIVE 09/01/2020 0908   LEUKOCYTESUR Negative 11/14/2013 1034   Sepsis  Labs: @LABRCNTIP (procalcitonin:4,lacticidven:4) ) Recent Results (from the past 240 hour(s))  Blood Culture (routine x 2)     Status: None   Collection Time: 09/06/2020 12:28 AM   Specimen: Right Antecubital; Blood  Result Value Ref Range Status   Specimen Description RIGHT ANTECUBITAL  Final   Special Requests   Final    BOTTLES DRAWN AEROBIC AND ANAEROBIC Blood Culture adequate volume   Culture   Final    NO GROWTH 5  DAYS Performed at [facility], [address] [place], [place] [address]    Report Status 08/19/2020 FINAL  Final  Blood Culture (routine x 2)     Status: None   Collection Time: 08/10/2020 12:30 AM   Specimen: BLOOD RIGHT FOREARM  Result Value Ref Range Status   Specimen Description BLOOD RIGHT FOREARM  Final   Special Requests   Final    BOTTLES DRAWN AEROBIC AND ANAEROBIC Blood Culture adequate volume   Culture   Final    NO GROWTH 5 DAYS Performed at [facility], [address], [place], [place] [address]    Report Status 08/19/2020 FINAL  Final  Resp Panel by RT-PCR (Flu A&B, Covid) Nasopharyngeal Swab     Status: None   Collection Time: 08/28/2020 12:33 AM   Specimen: Nasopharyngeal Swab; Nasopharyngeal(NP) swabs in vial transport medium  Result Value Ref Range Status   SARS Coronavirus 2 by RT PCR NEGATIVE NEGATIVE Final    Comment: (NOTE) SARS-CoV-2 target nucleic acids are NOT DETECTED.  The SARS-CoV-2 RNA is generally detectable in upper respiratory specimens during the acute phase of infection. The lowest concentration of SARS-CoV-2 viral copies this assay can detect is 138 copies/mL. A negative result does not preclude SARS-Cov-2 infection and should not be used as the sole basis for treatment or other patient management decisions. A negative result may occur with  improper specimen collection/handling, submission of specimen other than nasopharyngeal swab, presence of viral mutation(s) within the areas targeted by this assay, and inadequate number of  viral copies(<138 copies/mL). A negative result must be combined with clinical observations, patient history, and epidemiological information. The expected result is Negative.  Fact Sheet for Patients:  EntrepreneurPulse.com.au  Fact Sheet for Healthcare Providers:  IncredibleEmployment.be  This test is no t yet approved or cleared by the [place] FDA and  has been authorized for detection and/or diagnosis of SARS-CoV-2 by FDA under an Emergency Use Authorization (EUA). This EUA will remain  in effect (meaning this test can be used) for the duration of the COVID-19 declaration under Section 564(b)(1) of the Act, 21 U.S.C.section 360bbb-3(b)(1), unless the authorization is terminated  or revoked sooner.       Influenza A by PCR NEGATIVE NEGATIVE Final   Influenza B by PCR NEGATIVE NEGATIVE Final    Comment: (NOTE) The Xpert Xpress SARS-CoV-2/FLU/RSV plus assay is intended as an aid in the diagnosis of influenza from Nasopharyngeal swab specimens and should not be used as a sole basis for treatment. Nasal washings and aspirates are unacceptable for Xpert Xpress SARS-CoV-2/FLU/RSV testing.  Fact Sheet for Patients: EntrepreneurPulse.com.au  Fact Sheet for Healthcare Providers: IncredibleEmployment.be  This test is not yet approved or cleared by the [place] FDA and has been authorized for detection and/or diagnosis of SARS-CoV-2 by FDA under an Emergency Use Authorization (EUA). This EUA will remain in effect (meaning this test can be used) for the duration of the COVID-19 declaration under Section 564(b)(1) of the Act, 21 U.S.C. section 360bbb-3(b)(1), unless the authorization is terminated or revoked.  Performed at [facility], [address], [place], [place] [address]   MRSA Next Gen by PCR, Nasal     Status: None   Collection Time: 08/25/2020  9:07 AM   Specimen: Nasal Mucosa; Nasal Swab   Result Value Ref Range Status   MRSA by PCR Next Gen NOT DETECTED NOT DETECTED Final    Comment: (NOTE) The GeneXpert MRSA Assay (FDA approved for NASAL specimens only), is one  component of a comprehensive MRSA colonization surveillance program. It is not intended to diagnose MRSA infection nor to guide or monitor treatment for MRSA infections. Test performance is not FDA approved in patients less than 48 years old. Performed at [facility], [address] [place],  [address]      Scheduled Meds:  Chlorhexidine Gluconate Cloth  6 each Topical Daily   mouth rinse  15 mL Mouth Rinse BID   Continuous Infusions:  Procedures/Studies: CT CHEST W CONTRAST  Result Date: 08/13/2020 CLINICAL DATA:  Possible sepsis, recent diagnosis of lung cancer EXAM: CT CHEST WITH CONTRAST TECHNIQUE: Multidetector CT imaging of the chest was performed during intravenous contrast administration. CONTRAST:  53mL OMNIPAQUE IOHEXOL 350 MG/ML SOLN COMPARISON:  Radiograph 08/19/2020, CT 02/22/2018 FINDINGS: Cardiovascular: Normal cardiac size. Three-vessel coronary artery atherosclerosis. Dense calcification in the aortic leaflets. No pericardial effusion. Atherosclerotic plaque within the normal caliber aorta. No acute aortic abnormality is evident. Normal 3 vessel branching of the mildly calcified proximal great vessels without acute vascular abnormality. Central pulmonary arteries are normal caliber. Narrowing of the hilar airways and vessels entering the right upper lobe in the region of the large right upper lobe mass. Mediastinum/Nodes: The right hilar structures are closely abutted and partially obscured by large heterogeneous mass lesion which appears predominantly centered in the right upper lobe. Notable narrowing and occlusion of the right upper lobe airways and vessels. Mediastinal adenopathy is seen including a 14 mm precarinal node (2/61) and a 10 mm subcarinal node (2/74). No demonstrable left hilar  adenopathy or concerning axillary adenopathy. No acute abnormality more central trachea or thoracic esophagus. Thyroid gland and thoracic inlet are unremarkable. Lungs/Pleura: Large heterogeneous enhancing lesion centered in the right upper lobe measuring 10.8 x 9.4 x 10.8 cm in size with more hypoattenuating central regions which could reflect some central necrosis. This distorts and occludes the closely approximated high right hilar vessels and airways with essentially complete occlusion of the right upper lobe bronchus and extensive surrounding airless lung. Additional areas of projection into both the posterior right middle lobe (5/38 and right lower lobe (6/34). Regions of airless lung abutting this mass lesion demonstrate heterogeneous parenchymal enhancement which could reflect postobstructive pneumonia or direct extension of the mass lesion, difficult to fully ascertain. Airways thickening as well as basilar predominant interstitial opacities and basilar density in both lower lobes are more nonspecific. Background of centrilobular and paraseptal emphysematous changes. Upper Abdomen: Periportal edematous changes, nonspecific. No visible focal liver lesion within the limitations of this exam. No other acute or suspicious upper abdominal abnormalities. Musculoskeletal: No suspicious lytic or blastic lesions. Multilevel degenerative changes are present in the imaged portions of the spine. IMPRESSION: Heterogeneously enhancing mass lesion seen in the right upper lobe with some central hypoattenuation, possibly necrotic change. Closely approximating the right hilar structures with narrowing of the airways and vessels in this vicinity. Consistent with patient's reported primary lung malignancy. Heterogeneous enhancement of the surrounding airless lung parenchyma the left upper lobe, can reflect some direct extension and or postobstructive pneumonia particularly clinical features of sepsis. Additional airways  thickening, interstitial opacity and basilar consolidation, more nonspecific and could reflect infection, edema versus lymphangitic spread. Mediastinal adenopathy, concerning for metastatic disease. Aortic Atherosclerosis (ICD10-I70.0). Emphysema (ICD10-J43.9). These results were called by telephone at the time of interpretation on 08/13/2020 at 4:02 am to provider Rebecka Oelkers ORTIZ , who verbally acknowledged these results. Electronically Signed   By: Lovena Le M.D.   On: 08/26/2020 04:02   DG  CHEST PORT 1 VIEW  Result Date: 08/17/2020 CLINICAL DATA:  Acute respiratory failure with hypoxia. EXAM: PORTABLE CHEST 1 VIEW COMPARISON:  August 14, 2020. FINDINGS: The heart size and mediastinal contours are within normal limits. No pneumothorax or pleural effusion is noted. Stable large right upper lobe opacity is noted most consistent with postobstructive atelectasis or pneumonia secondary to central malignancy or neoplasm. Mild left midlung opacity is noted concerning for possible infiltrate or atelectasis. The visualized skeletal structures are unremarkable. IMPRESSION: Stable large right upper lobe opacity is noted most consistent with postobstructive atelectasis or pneumonia secondary to central malignancy or neoplasm. Mild left midlung opacity is noted concerning for possible infiltrate or atelectasis. Electronically Signed   By: Marijo Conception M.D.   On: 08/17/2020 14:44   DG Chest Port 1 View  Result Date: 08/27/2020 CLINICAL DATA:  Questionable sepsis EXAM: PORTABLE CHEST 1 VIEW COMPARISON:  02/18/2018 FINDINGS: Complete opacification of the right upper lung/lobe. This could reflect infiltrate or mass. No confluent opacity on the left. No visible effusions. Mild hyperinflation/COPD. Heart is normal size. IMPRESSION: Complete opacification of the right upper lobe. This could reflect pneumonia or mass. Postobstructive process cannot be excluded. Consider further evaluation with chest CT with IV contrast.  Electronically Signed   By: Rolm Baptise M.D.   On: 09/07/2020 01:19    Orson Eva, DO  [facility]  If 7PM-7AM, please contact night-coverage [email or web address] Password [email or web address] 08/22/2020, 4:55 PM   LOS: 8 days

## 2020-08-22 NOTE — Progress Notes (Signed)
Patient unable to tolerate any oral intake this shift. Oral care provided. Patient turned and repositioned Q 2 hours. Started on Morphine drip at 1.5mg /hour. Patient has periods of tachypnea. Morphine therapeutic in slowing respirations at this time. HOB at 30*. Will continue to monitor

## 2020-08-22 NOTE — Progress Notes (Signed)
Nutrition Brief Note  Chart reviewed.  Pt transitioned to comfort care 8/13 and Hospice referral in process.  Refusing meals today per nursing.   No further nutrition interventions planned at this time.   Please re-consult as needed.   Colman Cater MS,RD,CSG,LDN Contact: Shea Evans

## 2020-08-22 NOTE — Plan of Care (Signed)
  Problem: Education: Goal: Knowledge of General Education information will improve Description: Including pain rating scale, medication(s)/side effects and non-pharmacologic comfort measures Outcome: Progressing   Problem: Clinical Measurements: Goal: Will remain free from infection Outcome: Progressing   Problem: Clinical Measurements: Goal: Diagnostic test results will improve Outcome: Progressing   

## 2020-08-23 DIAGNOSIS — J181 Lobar pneumonia, unspecified organism: Secondary | ICD-10-CM | POA: Diagnosis not present

## 2020-08-23 DIAGNOSIS — J9621 Acute and chronic respiratory failure with hypoxia: Secondary | ICD-10-CM | POA: Diagnosis not present

## 2020-08-23 DIAGNOSIS — J9601 Acute respiratory failure with hypoxia: Secondary | ICD-10-CM | POA: Diagnosis not present

## 2020-08-23 DIAGNOSIS — J441 Chronic obstructive pulmonary disease with (acute) exacerbation: Secondary | ICD-10-CM | POA: Diagnosis not present

## 2020-08-26 LAB — PTH-RELATED PEPTIDE: PTH-related peptide: <2 pmol/L

## 2020-09-08 NOTE — Death Summary Note (Signed)
DEATH SUMMARY   Patient Details  Name: Robert [name] MRN: [id number] DOB: Dec 13, 1955  Admission/Discharge Information   Admit Date:  08/27/20  Date of Death: Date of Death: 09-05-20  Time of Death: Time of Death: 0720  Length of Stay: March 30, 2022  Referring Physician: [facility]   Reason(s) for Hospitalization  Dyspnea, pneumonia  Diagnoses  Preliminary cause of death:  Secondary Diagnoses (including complications and co-morbidities):  Sepsis -present on admission -presented with fever, tachycardia, tachypnea, leukocytosis -continue cefepime -MRSA neg -due to pneumonia -PCT 0.50 -lactic peaked 1.9 -sepsis physiology resolved -8/13--no longer active issues as focus of care transitioned to full comfort   Acute on chronic respiratory failure with hypoxia and hypercapnea -due to pneumonia and COPD exacerbation -remains on BiPAP -does not want to escalate care at this point and has requested DNR DNI status -GOC discussion continues daily with family -repeat ABG 8/11--7.43/58/79/36 on 0.6 -tolerated HFNC about 10 hours 8/11-->back on BiPAP overnight 8/11-8/12 -08/20/20--family decided to transition to focus on full comfort-->will not restart BiPAP -morphine and ativan for anxiety and sob -8/13--no longer active issues as focus of care transitioned to full comfort   Lobar Pneumonia -continue cefepime -MRSA neg -personally reviewed CXR--RUL opacity, increased interstitial markings -08/20/20--family decided to transition to focus on full comfort-->will not restart BiPAP -8/13--no longer active issues as focus of care transitioned to full comfort   Hypercalcemia -8/11 corrected calcium 12.8 -likely due to immobility and malignancy -intact PTH--9 -PTHrp--pending -25 vitamin D--9.38 -1,25 vitamin D--pending -started IVF -Esparto calcitonin and bisphosphonate not started as focus of patient's care transitioned to full comfort   COPD  exacerbation -continue duonebs -continue pulmicort -continue IV solumedrol -focus of patient's care transitioned to full comfort   Newly diagnosed RUL malignancy  -was scheduled for outpatient follow up with PET scan and other tests.   -palliative medicine consultation for further goals of care discussions. -08-27-20 CT chest with RUL mass with central hypoattenuation; additional airway thickening, interstitial opacity and basilar consolidation which may be edema, infection vs lymphangitic spread -focus of patient's care transitioned to full comfort   Anemia in neoplastic disease -  -Hg down to 7.  Given 1 unit PRBC 8/9 -B12--286 -iron studies--iron sat 9 %; ferritin 493 -8/13--no longer active issues as focus of care transitioned to full comfort   Tobacco abuse -cessation discussed     SVT -intermittently up to 180-200 -continue IV lopressor>>d/c as focus of care transitioned to full comfort -8/13--no longer active issues as focus of care transitioned to full comfort   Bipolar disorder -unable to take cymbalta, buspar, remeron while on bipap   Goals of Care -.palliative medicine following -Advance care planning, including the explanation and discussion of advance directives was carried out with the patient and family.  Code status including explanations of "Full Code" and "DNR" and alternatives were discussed in detail.  Discussion of end-of-life issues including but not limited palliative care, hospice care and the concept of hospice, other end-of-life care options, power of attorney for health care decisions, living wills, and physician orders for life-sustaining treatment were also discussed with the patient and family.  Total face to face time 16 minutes. -08/20/20 -08/20/20--family decided to transition to focus on full comfort-->will not restart BiPAP -morphine and ativan for anxiety and [facility] Course (including significant findings, care, treatment, and  services provided and events leading to death)  Robert [name] is a  65 y/o male with a history  of COPD, chronic respiratory failure on 3 L, anxiety, tobacco abuse, alcohol abuse, bipolar disorder, PTSD, HLD, recently diagnosed Lung Cancer brought to the emergency department via EMS due to dyspnea.  He was recently discharged from [facility], [place] due to sepsis secondary to pneumonia in the setting of newly diagnosed lung cancer.  He received 125 mg of Solu-Medrol IV PE, 2 duo nebs and an albuterol neb in route.  He is currently on BiPAP and unable to provide much information.  He denied having any headache, chest, back or abdominal pain at this time.   ED Course: Initial vital signs were temperature 103.6 F, pulse 01/09/1932, respirations 37, BP 116/50 mmHg and O2 sat 98% on NRB oxygen.  The patient was going to be intubated by Dr. Wyvonnia Dusky but he declined and wants to be DNR.  This was confirmed to me while I was examining him.  He did receive acetaminophen 650 mg rectally, 3000 mL of IV fluid bolus, magnesium sulfate 2 g IVPB, no other DuoNeb, cefepime and vancomycin. The patient was ultimately placed on bipap.  The patient had episodes of agitation and confusion.  Palliative medicine was consulted to help discuss [place].  The pt was slow to improve although sepsis physiology did improve.  He continued to need intermittent bipap.  Given his overall deconditioned and debilitated state, he certainly would not be a candidate for chemotherapy.  His prognosis for recovery to his pre-morbid state was poor in the setting of his multiple medical problems, deconditioning, and lung cancer with likelihood of lymphangitic spread.  This was discussed with his family through many [place] discussions.  Ultimately, the patient's sons and daughter decided to transition his focus of care to focus soley on his comfort.  Medications with curative intent were discontinued.   Pertinent Labs and Studies  Significant Diagnostic  Studies CT CHEST W CONTRAST  Result Date: 08/31/2020 CLINICAL DATA:  Possible sepsis, recent diagnosis of lung cancer EXAM: CT CHEST WITH CONTRAST TECHNIQUE: Multidetector CT imaging of the chest was performed during intravenous contrast administration. CONTRAST:  38mL OMNIPAQUE IOHEXOL 350 MG/ML SOLN COMPARISON:  Radiograph 09/06/2020, CT 02/22/2018 FINDINGS: Cardiovascular: Normal cardiac size. Three-vessel coronary artery atherosclerosis. Dense calcification in the aortic leaflets. No pericardial effusion. Atherosclerotic plaque within the normal caliber aorta. No acute aortic abnormality is evident. Normal 3 vessel branching of the mildly calcified proximal great vessels without acute vascular abnormality. Central pulmonary arteries are normal caliber. Narrowing of the hilar airways and vessels entering the right upper lobe in the region of the large right upper lobe mass. Mediastinum/Nodes: The right hilar structures are closely abutted and partially obscured by large heterogeneous mass lesion which appears predominantly centered in the right upper lobe. Notable narrowing and occlusion of the right upper lobe airways and vessels. Mediastinal adenopathy is seen including a 14 mm precarinal node (2/61) and a 10 mm subcarinal node (2/74). No demonstrable left hilar adenopathy or concerning axillary adenopathy. No acute abnormality more central trachea or thoracic esophagus. Thyroid gland and thoracic inlet are unremarkable. Lungs/Pleura: Large heterogeneous enhancing lesion centered in the right upper lobe measuring 10.8 x 9.4 x 10.8 cm in size with more hypoattenuating central regions which could reflect some central necrosis. This distorts and occludes the closely approximated high right hilar vessels and airways with essentially complete occlusion of the right upper lobe bronchus and extensive surrounding airless lung. Additional areas of projection into both the posterior right middle lobe (5/38 and right  lower lobe (6/34). Regions of airless  lung abutting this mass lesion demonstrate heterogeneous parenchymal enhancement which could reflect postobstructive pneumonia or direct extension of the mass lesion, difficult to fully ascertain. Airways thickening as well as basilar predominant interstitial opacities and basilar density in both lower lobes are more nonspecific. Background of centrilobular and paraseptal emphysematous changes. Upper Abdomen: Periportal edematous changes, nonspecific. No visible focal liver lesion within the limitations of this exam. No other acute or suspicious upper abdominal abnormalities. Musculoskeletal: No suspicious lytic or blastic lesions. Multilevel degenerative changes are present in the imaged portions of the spine. IMPRESSION: Heterogeneously enhancing mass lesion seen in the right upper lobe with some central hypoattenuation, possibly necrotic change. Closely approximating the right hilar structures with narrowing of the airways and vessels in this vicinity. Consistent with patient's reported primary lung malignancy. Heterogeneous enhancement of the surrounding airless lung parenchyma the left upper lobe, can reflect some direct extension and or postobstructive pneumonia particularly clinical features of sepsis. Additional airways thickening, interstitial opacity and basilar consolidation, more nonspecific and could reflect infection, edema versus lymphangitic spread. Mediastinal adenopathy, concerning for metastatic disease. Aortic Atherosclerosis (ICD10-I70.0). Emphysema (ICD10-J43.9). These results were called by telephone at the time of interpretation on 09/05/2020 at 4:02 am to provider Edith Groleau ORTIZ , who verbally acknowledged these results. Electronically Signed   By: Lovena Le M.D.   On: 08/22/2020 04:02   DG CHEST PORT 1 VIEW  Result Date: 08/17/2020 CLINICAL DATA:  Acute respiratory failure with hypoxia. EXAM: PORTABLE CHEST 1 VIEW COMPARISON:  August 14, 2020.  FINDINGS: The heart size and mediastinal contours are within normal limits. No pneumothorax or pleural effusion is noted. Stable large right upper lobe opacity is noted most consistent with postobstructive atelectasis or pneumonia secondary to central malignancy or neoplasm. Mild left midlung opacity is noted concerning for possible infiltrate or atelectasis. The visualized skeletal structures are unremarkable. IMPRESSION: Stable large right upper lobe opacity is noted most consistent with postobstructive atelectasis or pneumonia secondary to central malignancy or neoplasm. Mild left midlung opacity is noted concerning for possible infiltrate or atelectasis. Electronically Signed   By: Marijo Conception M.D.   On: 08/17/2020 14:44   DG Chest Port 1 View  Result Date: 08/28/2020 CLINICAL DATA:  Questionable sepsis EXAM: PORTABLE CHEST 1 VIEW COMPARISON:  02/18/2018 FINDINGS: Complete opacification of the right upper lung/lobe. This could reflect infiltrate or mass. No confluent opacity on the left. No visible effusions. Mild hyperinflation/COPD. Heart is normal size. IMPRESSION: Complete opacification of the right upper lobe. This could reflect pneumonia or mass. Postobstructive process cannot be excluded. Consider further evaluation with chest CT with IV contrast. Electronically Signed   By: Rolm Baptise M.D.   On: 08/27/2020 01:19    Microbiology Recent Results (from the past 240 hour(s))  Blood Culture (routine x 2)     Status: None   Collection Time: 09/06/2020 12:28 AM   Specimen: Right Antecubital; Blood  Result Value Ref Range Status   Specimen Description RIGHT ANTECUBITAL  Final   Special Requests   Final    BOTTLES DRAWN AEROBIC AND ANAEROBIC Blood Culture adequate volume   Culture   Final    NO GROWTH 5 DAYS Performed at [facility], [address], [place], [place] [address]    Report Status 08/19/2020 FINAL  Final  Blood Culture (routine x 2)     Status: None   Collection Time: 08/19/2020  12:30 AM   Specimen: BLOOD RIGHT FOREARM  Result Value Ref Range Status   Specimen Description  BLOOD RIGHT FOREARM  Final   Special Requests   Final    BOTTLES DRAWN AEROBIC AND ANAEROBIC Blood Culture adequate volume   Culture   Final    NO GROWTH 5 DAYS Performed at [facility], [address], [place], [place] [address]    Report Status 08/19/2020 FINAL  Final  Resp Panel by RT-PCR (Flu A&B, Covid) Nasopharyngeal Swab     Status: None   Collection Time: 08/25/2020 12:33 AM   Specimen: Nasopharyngeal Swab; Nasopharyngeal(NP) swabs in vial transport medium  Result Value Ref Range Status   SARS Coronavirus 2 by RT PCR NEGATIVE NEGATIVE Final    Comment: (NOTE) SARS-CoV-2 target nucleic acids are NOT DETECTED.  The SARS-CoV-2 RNA is generally detectable in upper respiratory specimens during the acute phase of infection. The lowest concentration of SARS-CoV-2 viral copies this assay can detect is 138 copies/mL. A negative result does not preclude SARS-Cov-2 infection and should not be used as the sole basis for treatment or other patient management decisions. A negative result may occur with  improper specimen collection/handling, submission of specimen other than nasopharyngeal swab, presence of viral mutation(s) within the areas targeted by this assay, and inadequate number of viral copies(<138 copies/mL). A negative result must be combined with clinical observations, patient history, and epidemiological information. The expected result is Negative.  Fact Sheet for Patients:  EntrepreneurPulse.com.au  Fact Sheet for Healthcare Providers:  IncredibleEmployment.be  This test is no t yet approved or cleared by the [place] FDA and  has been authorized for detection and/or diagnosis of SARS-CoV-2 by FDA under an Emergency Use Authorization (EUA). This EUA will remain  in effect (meaning this test can be used) for the duration of the COVID-19  declaration under Section 564(b)(1) of the Act, 21 U.S.C.section 360bbb-3(b)(1), unless the authorization is terminated  or revoked sooner.       Influenza A by PCR NEGATIVE NEGATIVE Final   Influenza B by PCR NEGATIVE NEGATIVE Final    Comment: (NOTE) The Xpert Xpress SARS-CoV-2/FLU/RSV plus assay is intended as an aid in the diagnosis of influenza from Nasopharyngeal swab specimens and should not be used as a sole basis for treatment. Nasal washings and aspirates are unacceptable for Xpert Xpress SARS-CoV-2/FLU/RSV testing.  Fact Sheet for Patients: EntrepreneurPulse.com.au  Fact Sheet for Healthcare Providers: IncredibleEmployment.be  This test is not yet approved or cleared by the [place] FDA and has been authorized for detection and/or diagnosis of SARS-CoV-2 by FDA under an Emergency Use Authorization (EUA). This EUA will remain in effect (meaning this test can be used) for the duration of the COVID-19 declaration under Section 564(b)(1) of the Act, 21 U.S.C. section 360bbb-3(b)(1), unless the authorization is terminated or revoked.  Performed at [facility], [address], [place], [place] [address]   MRSA Next Gen by PCR, Nasal     Status: None   Collection Time: 08/25/2020  9:07 AM   Specimen: Nasal Mucosa; Nasal Swab  Result Value Ref Range Status   MRSA by PCR Next Gen NOT DETECTED NOT DETECTED Final    Comment: (NOTE) The GeneXpert MRSA Assay (FDA approved for NASAL specimens only), is one component of a comprehensive MRSA colonization surveillance program. It is not intended to diagnose MRSA infection nor to guide or monitor treatment for MRSA infections. Test performance is not FDA approved in patients less than 14 years old. Performed at [facility], [address] [place], [place] [address]     Lab Basic Metabolic Panel:  Recent Labs  Lab 08/18/20 0430 08/19/20 0356 08/20/20 0402  NA 135 138 138  K 4.2 4.1  3.4*  CL 87* 89* 94*  CO2 34* 37* 39*  GLUCOSE 127* 158* 133*  BUN 20 28* 29*  CREATININE 0.53* 0.56* 0.57*  CALCIUM 11.5* 12.5* 12.4*   Liver Function Tests: Recent Labs  Lab 08/18/20 0430 08/19/20 0356 08/20/20 0402  AST 11* 11* 14*  ALT 14 12 13   ALKPHOS 74 67 64  BILITOT 1.4* 0.7 0.7  PROT 6.4* 6.2* 6.5  ALBUMIN 2.4* 2.4* 2.6*   No results for input(s): LIPASE, AMYLASE in the last 168 hours. No results for input(s): AMMONIA in the last 168 hours. CBC: Recent Labs  Lab 08/18/20 0430 08/19/20 0356 08/20/20 0402  WBC 17.1* 14.8* 12.5*  HGB 9.5* 9.2* 9.1*  HCT 31.9* 31.0* 31.1*  MCV 84.4 84.9 84.5  PLT 513* 564* 591*   Cardiac Enzymes: No results for input(s): CKTOTAL, CKMB, CKMBINDEX, TROPONINI in the last 168 hours. Sepsis Labs: Recent Labs  Lab 08/18/20 0430 08/19/20 0356 08/20/20 0402  PROCALCITON 0.50 0.60  --   WBC 17.1* 14.8* 12.5*    Procedures/Operations     Raman Featherston 08-31-20, 7:55 PM

## 2020-09-08 DEATH — deceased
